# Patient Record
Sex: Male | Born: 1965 | Race: Black or African American | Hispanic: No | Smoking: Former smoker
Health system: Southern US, Community
[De-identification: ages and names within clinical notes are randomized; demographics above are authoritative.]

## PROBLEM LIST (undated history)

## (undated) DIAGNOSIS — I639 Cerebral infarction, unspecified: Secondary | ICD-10-CM

## (undated) DIAGNOSIS — I10 Essential (primary) hypertension: Secondary | ICD-10-CM

## (undated) HISTORY — PX: NO PAST SURGERIES: SHX2092

## (undated) HISTORY — DX: Cerebral infarction, unspecified: I63.9

---

## 2014-02-11 ENCOUNTER — Encounter (HOSPITAL_COMMUNITY): Payer: Self-pay | Admitting: Emergency Medicine

## 2014-02-11 ENCOUNTER — Emergency Department (INDEPENDENT_AMBULATORY_CARE_PROVIDER_SITE_OTHER)
Admission: EM | Admit: 2014-02-11 | Discharge: 2014-02-11 | Disposition: A | Discharge status: Home / Self Care | Payer: Self-pay | Source: Home / Self Care | Attending: Emergency Medicine | Admitting: Emergency Medicine

## 2014-02-11 ENCOUNTER — Emergency Department (INDEPENDENT_AMBULATORY_CARE_PROVIDER_SITE_OTHER): Payer: Self-pay

## 2014-02-11 DIAGNOSIS — T07XXXA Unspecified multiple injuries, initial encounter: Secondary | ICD-10-CM

## 2014-02-11 DIAGNOSIS — Z23 Encounter for immunization: Secondary | ICD-10-CM

## 2014-02-11 DIAGNOSIS — W19XXXA Unspecified fall, initial encounter: Secondary | ICD-10-CM

## 2014-02-11 DIAGNOSIS — IMO0002 Reserved for concepts with insufficient information to code with codable children: Secondary | ICD-10-CM

## 2014-02-11 DIAGNOSIS — S8000XA Contusion of unspecified knee, initial encounter: Secondary | ICD-10-CM

## 2014-02-11 DIAGNOSIS — Y92410 Unspecified street and highway as the place of occurrence of the external cause: Secondary | ICD-10-CM

## 2014-02-11 DIAGNOSIS — S8002XA Contusion of left knee, initial encounter: Secondary | ICD-10-CM

## 2014-02-11 MED ORDER — TETANUS-DIPHTH-ACELL PERTUSSIS 5-2.5-18.5 LF-MCG/0.5 IM SUSP
INTRAMUSCULAR | Status: AC
  Filled 2014-02-11: qty 0.5

## 2014-02-11 MED ORDER — TRAMADOL HCL 50 MG PO TABS: 100.0000 mg | ORAL_TABLET | Freq: Three times a day (TID) | ORAL | Status: DC | PRN

## 2014-02-11 MED ORDER — TETANUS-DIPHTH-ACELL PERTUSSIS 5-2.5-18.5 LF-MCG/0.5 IM SUSP
0.5000 mL | Freq: Once | INTRAMUSCULAR | Status: AC
  Administered 2014-02-11: 0.5 mL via INTRAMUSCULAR

## 2014-02-11 MED ORDER — MUPIROCIN 2 % EX OINT: 1.0000 "application " | TOPICAL_OINTMENT | Freq: Three times a day (TID) | CUTANEOUS | Status: DC

## 2014-02-11 MED ORDER — BACITRACIN ZINC 500 UNIT/GM EX OINT
TOPICAL_OINTMENT | CUTANEOUS | Status: AC
  Filled 2014-02-11: qty 2.7

## 2014-02-11 MED ORDER — HYDROCODONE-ACETAMINOPHEN 5-325 MG PO TABS: ORAL_TABLET | ORAL | Status: DC

## 2014-02-11 NOTE — ED Provider Notes (Signed)
Chief Complaint   Chief Complaint  Patient presents with  . Fall    History of Present Illness   Darryl Sullivan is a 48 year old male who fell this past Monday, 4 days ago, while walking on a sidewalk. The patient states his right leg just gave way on him. He denies loss of consciousness or fainting. He abraded the right side of his face, his right dorsal hand, and his right knee. He's been ambulatory since then. He denies any headache or neurological symptoms. He has no visual symptoms. The abrasions on the face are healing up well. He has 2 small abrasions on the dorsum of his right hand. All of his digits have full range of motion and he denies any pain in the hand. He has a larger abrasion on his right knee overlying the patella. The patella is sore. He is ambulatory but has been walking with a limp. He denies any further giving way of the knee.  Review of Systems   Other than as noted above, the patient denies any of the following symptoms: ENT:  No headache, facial pain, or bleeding from the nose or ears.  No loose or broken teeth. Neck:  No neck pain or stiffnes. Cardiac:  No chest pain. No palpitations, dizziness, syncope or fainting. GI:  No abdominal pain. No nausea or vomiting. M-S:  No extremity pain, swelling, bruising, limited ROM, or back pain. Neuro:  No loss of consciousness, seizure activity, dizziness, vertigo, paresthesias, numbness, or weakness.  No difficulty with speech or ambulation.  PMFSH   Past medical history, family history, social history, meds, and allergies were reviewed.  He is allergic to penicillin and has intolerance to tramadol.  Physical Examination    Vital signs:  BP 189/114  Pulse 84  Temp(Src) 98.7 F (37.1 C) (Oral)  Resp 12  SpO2 97% General:  Alert, oriented and in no distress. Eye:  PERRL, full EOMs. ENT:  No cranial or facial tenderness to palpation. He had some healing abrasions on his forehead and around his right eye and on his  right cheek. There was no tenderness to palpation. Neck:  No tenderness to palpation.  Full ROM without pain. Heart:  Regular rhythm.  No extrasystoles, gallops, or murmers. Lungs:  No chest wall tenderness to palpation. Breath sounds clear and equal bilaterally.  No wheezes, rales or rhonchi. Abdomen:  Non tender. Back:  Non tender to palpation.  Full ROM without pain. Extremities:  There are 2 small abrasions on the dorsum of the right hand. All joints have full range of motion. There was no tenderness to palpation exam of the right knee reveals tenderness to palpation over the patella and medial and lateral joint lines. The knee had a full range of motion with no pain and no crepitus. There is sign was negative, Lachman's sign was negative, anterior drawer sign was negative, valgus and varus stress were normal.  Full ROM of all joints without pain.  Pulses full.  Brisk capillary refill. Neuro:  Alert and oriented times 3.  Cranial nerves intact.  No muscle weakness.  Sensation intact to light touch.  Gait normal. Skin:  No bruising, abrasions, or lacerations.  Radiology   Dg Knee Complete 4 Views Right  02/11/2014   CLINICAL DATA:  Fall 4 days ago with right knee pain  EXAM: RIGHT KNEE - COMPLETE 4+ VIEW  COMPARISON:  None.  FINDINGS: Soft tissue swelling to the anterior knee. There is no evidence of fracture, dislocation, or  joint effusion. Chronic, incidental insertional changes at the tibial tuberosity. No joint narrowing.  IMPRESSION: Anterior soft tissue swelling.  No acute osseous findings.   Electronically Signed   By: Tiburcio Pea M.D.   On: 02/11/2014 16:09    [facility]   Antibiotic ointment was applied to all the abrasions. A sterile dressing was applied to the right knee followed by a knee sleeve.  Assessment   The primary encounter diagnosis was Fall, initial encounter. Diagnoses of Abrasions of multiple sites, Knee contusion, left, initial encounter, and  Place of occurrence, street and highway were also pertinent to this visit.  Plan   1.  Meds:  The following meds were prescribed:   New Prescriptions   HYDROCODONE-ACETAMINOPHEN (NORCO/VICODIN) 5-325 MG PER TABLET    1 to 2 tabs every 4 to 6 hours as needed for pain.   MUPIROCIN OINTMENT (BACTROBAN) 2 %    Apply 1 application topically 3 (three) times daily.    2.  Patient Education/Counseling:  The patient was given appropriate handouts, self care instructions, and instructed in symptomatic relief.    3.  Follow up:  The patient was told to follow up here if no better in 3 to 4 days, or sooner if becoming worse in any way, and given some red flag symptoms such as increasing pain or new neurological symptoms which would prompt immediate return.  Follow up here if necessary.     Reuben Likes, MD 02/11/14 [phone]

## 2014-02-11 NOTE — Discharge Instructions (Signed)
Knee pain can be caused by many conditions:  Osteoarthritis, gout, bursitis, tendonitis, cartiledge damage, condromalacia patella, patellofemoral syndrome, and ligament sprain to name just a few.  Often some simple conservative measures can help alleviate the pain.  Do not do the following:  Avoid squatting and doing deep knee bends.  This puts too much of load on your cartiledges and tendons.  If you do a knee bend, go only half way down, flexing your knee no more than 90 degrees.  Do the following:  If you are overweight or obese, lose weight.  This makes for a lot less load on your knee joints.  If you use tobacco, quit.  Nicotine causes spasm of the small arteries, decreases blood flow, and impairs your body's normal ability to repair damage.  If your knee is acutely inflamed, use the principles of RICE (rest, ice, compression, and elevation).  Wearing a knee brace can help.  These are usually made of neoprene and can be purchased over the counter at the drug store.  Use of over the counter pain meds can be of help.  Tylenol (or acetaminophen) is the safest to use.  It often helps to take this regularly.  You can take up to 2 325 mg tablets 5 times daily, but it best to start out much lower that that, perhaps 2 325 mg tablets twice daily, then increase from there. People who are on the blood thinner warfarin have to be careful about taking high doses of Tylenol.  For people who are able to tolerate them, ibuprofen and naproxyn can also help with the pain.  You should discuss these agents with your physician before taking them.  People with chronic kidney disease, hypertension, peptic ulcer disease, and reflux can suffer adverse side effects. They should not be taken with warfarin. The maximum dosage of ibuprofen is 800 mg 3 times daily with meals.  The maximum dosage of naprosyn is 2 and 1/2 tablets twice daily with food, but again, start out low and gradually increase the dose until adequate  pain relief is achieved. Ibuprofen and naprosyn should always be taken with food.  People with cartiledge injury or osteoarthritis may find glucosamine to be helpful.  This is an over-the-counter supplement that helps nourish and repair cartiledge.  The dose is 500 mg 3 times daily or 1500 mg taken in a single dose. This can take several months to work and it doesn't always work.    For people with knee pain on just one side, use of a cane held in the hand on the same side as the knee pain takes some of the stress off the knee joint and can make a big difference in knee pain.  Wearing good shoes with adequate arch support is essential.  Regular exercise is of utmost importance.  Swimming, water aerobics, or use of an elliptical exerciser put the least stress on the knees of any exercise.  Finally doing the exercises below can be very helpful.  They tend to strengthen the muscles around the knee and provide extra support and stability.  Try to do them twice a day followed by ice for 10 minutes.       Abrasion An abrasion is a cut or scrape of the skin. Abrasions do not extend through all layers of the skin and most heal within 10 days. It is important to care for your abrasion properly to prevent infection. CAUSES  Most abrasions are caused by falling on, or gliding across,  the ground or other surface. When your skin rubs on something, the outer and inner layer of skin rubs off, causing an abrasion. DIAGNOSIS  Your caregiver will be able to diagnose an abrasion during a physical exam.  TREATMENT  Your treatment depends on how large and deep the abrasion is. Generally, your abrasion will be cleaned with water and a mild soap to remove any dirt or debris. An antibiotic ointment may be put over the abrasion to prevent an infection. A bandage (dressing) may be wrapped around the abrasion to keep it from getting dirty.  You may need a tetanus shot if:  You cannot remember when you had your last  tetanus shot.  You have never had a tetanus shot.  The injury broke your skin. If you get a tetanus shot, your arm may swell, get red, and feel warm to the touch. This is common and not a problem. If you need a tetanus shot and you choose not to have one, there is a rare chance of getting tetanus. Sickness from tetanus can be serious.  HOME CARE INSTRUCTIONS   If a dressing was applied, change it at least once a day or as directed by your caregiver. If the bandage sticks, soak it off with warm water.   Wash the area with water and a mild soap to remove all the ointment 2 times a day. Rinse off the soap and pat the area dry with a clean towel.   Reapply any ointment as directed by your caregiver. This will help prevent infection and keep the bandage from sticking. Use gauze over the wound and under the dressing to help keep the bandage from sticking.   Change your dressing right away if it becomes wet or dirty.   Only take over-the-counter or prescription medicines for pain, discomfort, or fever as directed by your caregiver.   Follow up with your caregiver within 24-48 hours for a wound check, or as directed. If you were not given a wound-check appointment, look closely at your abrasion for redness, swelling, or pus. These are signs of infection. SEEK IMMEDIATE MEDICAL CARE IF:   You have increasing pain in the wound.   You have redness, swelling, or tenderness around the wound.   You have pus coming from the wound.   You have a fever or persistent symptoms for more than 2-3 days.  You have a fever and your symptoms suddenly get worse.  You have a bad smell coming from the wound or dressing.  MAKE SURE YOU:   Understand these instructions.  Will watch your condition.  Will get help right away if you are not doing well or get worse. Document Released: 03/07/2005 Document Revised: 05/14/2012 Document Reviewed: 05/01/2011 [facility] Patient Information 2015 [place],  [place]. This information is not intended to replace advice given to you by your health care provider. Make sure you discuss any questions you have with your health care provider.

## 2014-02-11 NOTE — ED Notes (Signed)
Pt reports legs gave out on him causing him to fall.  Abrasion to right side of the face.  Right knee pain and abrasion.      Denies any other injuries.   States incident happened two days ago.

## 2014-02-12 ENCOUNTER — Telehealth (HOSPITAL_COMMUNITY): Payer: Self-pay | Admitting: Emergency Medicine

## 2014-02-12 MED ORDER — HYDROCHLOROTHIAZIDE 12.5 MG PO TABS: 12.5000 mg | ORAL_TABLET | Freq: Every day | ORAL | Status: DC

## 2014-02-12 NOTE — ED Notes (Signed)
Patient's blood pressure was elevated yesterday, and I did not give him anything for it.  I called him back today and he states he's doing better.  No headache, chest pain or shortness of breath.  Will start on HCTZ 12.5 mg daily and have him follow up either here or with a primary care doctor in 1 to 2 weeks.   Reuben Likes, MD 02/12/14 [phone]

## 2014-09-07 ENCOUNTER — Emergency Department (HOSPITAL_COMMUNITY)
Admission: EM | Admit: 2014-09-07 | Discharge: 2014-09-07 | Disposition: A | Discharge status: Home / Self Care | Payer: Self-pay | Attending: Emergency Medicine | Admitting: Emergency Medicine

## 2014-09-07 ENCOUNTER — Emergency Department (HOSPITAL_COMMUNITY): Payer: Self-pay

## 2014-09-07 ENCOUNTER — Encounter (HOSPITAL_COMMUNITY): Payer: Self-pay | Admitting: Emergency Medicine

## 2014-09-07 DIAGNOSIS — Z880 Allergy status to penicillin: Secondary | ICD-10-CM | POA: Insufficient documentation

## 2014-09-07 DIAGNOSIS — M779 Enthesopathy, unspecified: Secondary | ICD-10-CM | POA: Insufficient documentation

## 2014-09-07 DIAGNOSIS — M7750 Other enthesopathy of unspecified foot: Secondary | ICD-10-CM

## 2014-09-07 MED ORDER — HYDROCODONE-ACETAMINOPHEN 5-325 MG PO TABS: 1.0000 | ORAL_TABLET | ORAL | Status: DC | PRN

## 2014-09-07 MED ORDER — DEXAMETHASONE 4 MG PO TABS: 4.0000 mg | ORAL_TABLET | Freq: Two times a day (BID) | ORAL | Status: DC

## 2014-09-07 MED ORDER — DICLOFENAC SODIUM 75 MG PO TBEC: 75.0000 mg | DELAYED_RELEASE_TABLET | Freq: Two times a day (BID) | ORAL | Status: DC

## 2014-09-07 NOTE — ED Provider Notes (Signed)
CSN: [id number]     Arrival date & time 09/07/14  1009 History   First MD Initiated Contact with Patient 09/07/14 1031     Chief Complaint  Patient presents with  . Ankle Pain     (Consider location/radiation/quality/duration/timing/severity/associated sxs/prior Treatment) Patient is a 49 y.o. male presenting with ankle pain. The history is provided by the patient.  Ankle Pain Location:  Ankle Ankle location:  R ankle Pain details:    Quality:  Shooting and sharp   Radiates to: radiates to toes.   Severity:  Moderate   Onset quality:  Gradual   Duration:  2 weeks   Timing:  Intermittent   Progression:  Worsening Chronicity:  New Prior injury to area:  No Relieved by:  Nothing Worsened by:  Bearing weight Ineffective treatments:  None tried Associated symptoms: swelling   Associated symptoms: no numbness and no tingling   Risk factors: no frequent fractures and no recent illness     History reviewed. No pertinent past medical history. History reviewed. No pertinent past surgical history. History reviewed. No pertinent family history. History  Substance Use Topics  . Smoking status: Never Smoker   . Smokeless tobacco: Never Used  . Alcohol Use: No    Review of Systems  Musculoskeletal: Positive for arthralgias.  All other systems reviewed and are negative.     Allergies  Penicillins and Tramadol  Home Medications   Prior to Admission medications   Medication Sig Start Date End Date Taking? Authorizing Provider  hydrochlorothiazide (HYDRODIURIL) 12.5 MG tablet Take 1 tablet (12.5 mg total) by mouth daily. Patient not taking: Reported on 09/07/2014 02/12/14   Reuben Likesavid C Keller, MD  HYDROcodone-acetaminophen (NORCO/VICODIN) 5-325 MG per tablet 1 to 2 tabs every 4 to 6 hours as needed for pain. Patient not taking: Reported on 09/07/2014 02/11/14   Reuben Likesavid C Keller, MD  mupirocin ointment (BACTROBAN) 2 % Apply 1 application topically 3 (three) times daily. Patient not  taking: Reported on 09/07/2014 02/11/14   Reuben Likesavid C Keller, MD   BP 161/104 mmHg  Pulse 89  Temp(Src) 98.5 F (36.9 C) (Oral)  Resp 18  Ht 5\' 7"  (1.702 m)  Wt 220 lb (99.791 kg)  BMI 34.45 kg/m2  SpO2 98% Physical Exam  Constitutional: He is oriented to person, place, and time. He appears well-developed and well-nourished.  Non-toxic appearance.  HENT:  Head: Normocephalic.  Right Ear: Tympanic membrane and external ear normal.  Left Ear: Tympanic membrane and external ear normal.  Eyes: EOM and lids are normal. Pupils are equal, round, and reactive to light.  Neck: Normal range of motion. Neck supple. Carotid bruit is not present.  Cardiovascular: Normal rate, regular rhythm, normal heart sounds, intact distal pulses and normal pulses.   Pulmonary/Chest: Breath sounds normal. No respiratory distress.  Abdominal: Soft. Bowel sounds are normal. There is no tenderness. There is no guarding.  Musculoskeletal:       Right ankle: He exhibits decreased range of motion. He exhibits no swelling, no deformity and normal pulse. Tenderness. Medial malleolus tenderness found.       Right foot: There is decreased range of motion and tenderness. There is no swelling and normal capillary refill.       Feet:  Lymphadenopathy:       Head (right side): No submandibular adenopathy present.       Head (left side): No submandibular adenopathy present.    He has no cervical adenopathy.  Neurological: He is alert and oriented to  person, place, and time. He has normal strength. No cranial nerve deficit or sensory deficit.  Skin: Skin is warm and dry.  Psychiatric: He has a normal mood and affect. His speech is normal.  Nursing note and vitals reviewed.   ED Course  Procedures (including critical care time) Labs Review Labs Reviewed - No data to display  Imaging Review No results found.   EKG Interpretation None      MDM  Blood pressure is elevated at 161/104, otherwise the vital signs are  within normal limits. The pulse oximetry is 98% on room air. Within normal limits by my interpretation. X-ray of the right ankle is negative for fracture, dislocation, gas, or acute abnormality.  Suspect the patient has a tendinitis involving the right foot and ankle. The patient will be treated with anti-inflammatories, steroid, and pain medication. Patient is to follow with primary care physician.    Final diagnoses:  None    *I have reviewed nursing notes, vital signs, and all appropriate lab and imaging results for this patient.[address], PA-C 09/07/14 1238  Glynn Octave, MD 09/07/14 [phone]

## 2014-09-07 NOTE — Discharge Instructions (Signed)
The x-ray of your foot and ankle is negative for fracture, dislocation, or sign of gas related infection. Please use the ankle stirrup splint for the next 7-10 days. Please use medications as suggested. Norco may cause drowsiness, please do not drive, drink alcohol, operating machinery, or participate in activities requiring concentration when taking this medication. Tendinitis Tendinitis is swelling and inflammation of the tendons. Tendons are band-like tissues that connect muscle to bone. Tendinitis commonly occurs in the:   Shoulders (rotator cuff).  Heels (Achilles tendon).  Elbows (triceps tendon). CAUSES Tendinitis is usually caused by overusing the tendon, muscles, and joints involved. When the tissue surrounding a tendon (synovium) becomes inflamed, it is called tenosynovitis. Tendinitis commonly develops in people whose jobs require repetitive motions. SYMPTOMS  Pain.  Tenderness.  Mild swelling. DIAGNOSIS Tendinitis is usually diagnosed by physical exam. Your health care provider may also order X-rays or other imaging tests. TREATMENT Your health care provider may recommend certain medicines or exercises for your treatment. HOME CARE INSTRUCTIONS   Use a sling or splint for as long as directed by your health care provider until the pain decreases.  Put ice on the injured area.  Put ice in a plastic bag.  Place a towel between your skin and the bag.  Leave the ice on for 15-20 minutes, 3-4 times a day, or as directed by your health care provider.  Avoid using the limb while the tendon is painful. Perform gentle range of motion exercises only as directed by your health care provider. Stop exercises if pain or discomfort increase, unless directed otherwise by your health care provider.  Only take over-the-counter or prescription medicines for pain, discomfort, or fever as directed by your health care provider. SEEK MEDICAL CARE IF:   Your pain and swelling  increase.  You develop new, unexplained symptoms, especially increased numbness in the hands. MAKE SURE YOU:   Understand these instructions.  Will watch your condition.  Will get help right away if you are not doing well or get worse. Document Released: 05/25/2000 Document Revised: 10/12/2013 Document Reviewed: 08/14/2010 [facility] Patient Information 2015 [place], [place]. This information is not intended to replace advice given to you by your health care provider. Make sure you discuss any questions you have with your health care provider.

## 2014-09-07 NOTE — ED Notes (Signed)
Patient c/o right inner ankle pain that radiates to top of foot and ankle x2 weeks. Denies any known injury. Per patient has had "to walk on side of foot due to pain." Denies taking any medication for pain. CNS intact.

## 2016-03-22 ENCOUNTER — Encounter (HOSPITAL_COMMUNITY): Payer: Self-pay | Admitting: Emergency Medicine

## 2016-03-22 ENCOUNTER — Emergency Department (HOSPITAL_COMMUNITY)
Admission: EM | Admit: 2016-03-22 | Discharge: 2016-03-23 | Disposition: A | Discharge status: Home / Self Care | Payer: Self-pay | Attending: Emergency Medicine | Admitting: Emergency Medicine

## 2016-03-22 ENCOUNTER — Emergency Department (HOSPITAL_COMMUNITY): Payer: Self-pay

## 2016-03-22 DIAGNOSIS — W230XXA Caught, crushed, jammed, or pinched between moving objects, initial encounter: Secondary | ICD-10-CM | POA: Insufficient documentation

## 2016-03-22 DIAGNOSIS — F17210 Nicotine dependence, cigarettes, uncomplicated: Secondary | ICD-10-CM | POA: Insufficient documentation

## 2016-03-22 DIAGNOSIS — Y999 Unspecified external cause status: Secondary | ICD-10-CM | POA: Insufficient documentation

## 2016-03-22 DIAGNOSIS — T148XXA Other injury of unspecified body region, initial encounter: Secondary | ICD-10-CM

## 2016-03-22 DIAGNOSIS — Y9301 Activity, walking, marching and hiking: Secondary | ICD-10-CM | POA: Insufficient documentation

## 2016-03-22 DIAGNOSIS — S9781XA Crushing injury of right foot, initial encounter: Secondary | ICD-10-CM | POA: Insufficient documentation

## 2016-03-22 DIAGNOSIS — Y929 Unspecified place or not applicable: Secondary | ICD-10-CM | POA: Insufficient documentation

## 2016-03-22 MED ORDER — NAPROXEN 250 MG PO TABS
500.0000 mg | ORAL_TABLET | Freq: Once | ORAL | Status: AC
  Administered 2016-03-22: 500 mg via ORAL
  Filled 2016-03-22: qty 2

## 2016-03-22 MED ORDER — NAPROXEN 500 MG PO TABS: 500.0000 mg | ORAL_TABLET | Freq: Two times a day (BID) | ORAL | 0 refills | Status: DC

## 2016-03-22 MED ORDER — HYDROCODONE-ACETAMINOPHEN 5-325 MG PO TABS: 1.0000 | ORAL_TABLET | Freq: Four times a day (QID) | ORAL | 0 refills | Status: DC | PRN

## 2016-03-22 NOTE — Discharge Instructions (Signed)
Take the meds as prescribed. See the orthopedics if you are not improving.
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
## 2019-01-29 ENCOUNTER — Ambulatory Visit
Admission: EM | Admit: 2019-01-29 | Discharge: 2019-01-29 | Disposition: A | Discharge status: Home / Self Care | Payer: Self-pay | Attending: Urgent Care | Admitting: Urgent Care

## 2019-01-29 ENCOUNTER — Other Ambulatory Visit: Payer: Self-pay

## 2019-01-29 DIAGNOSIS — L0390 Cellulitis, unspecified: Secondary | ICD-10-CM

## 2019-01-29 DIAGNOSIS — L253 Unspecified contact dermatitis due to other chemical products: Secondary | ICD-10-CM

## 2019-01-29 MED ORDER — PREDNISONE 10 MG (21) PO TBPK: ORAL_TABLET | Freq: Every day | ORAL | 0 refills | Status: DC

## 2019-01-29 MED ORDER — SULFAMETHOXAZOLE-TRIMETHOPRIM 800-160 MG PO TABS: 1.0000 | ORAL_TABLET | Freq: Two times a day (BID) | ORAL | 0 refills | Status: AC

## 2019-01-29 NOTE — ED Provider Notes (Signed)
[place], [place]   Name: Darryl Sullivan DOB: 10/20/1965 MRN: [id number] CSN: [id number] PCP: Patient, No Pcp Per  Arrival date and time:  01/29/19 [id number]  Chief Complaint:  Rash   NOTE: Prior to seeing the patient today, I have reviewed the triage nursing documentation and vital signs. Clinical staff has updated patient's PMH/PSHx, current medication list, and drug allergies/intolerances to ensure comprehensive history available to assist in medical decision making.   History:   HPI: Darryl Sullivan is a 53 y.o. male who presents today with complaints of rash following occupational chemical exposure. Patient reports that areas of concern have developed over the course of the last month as he has performed his normal job duties today at [employer] [place]. He does not wish to file worker's compensation for this visit. Patient notes that his exposure has been to a chemical called methyl ethyl ketone (MEK). Patient wearing PPE as supplied by his employer. He notes that the chemical has been getting inside of his gloves and causing irritation to his skin. Rash to hands extending up to wrists BILATERALLY (R>L). Patient with open areas to RIGHT wrist with noted erythema and purulent drainage. Patient advises that his supervisor is aware of his skin condition. Despite his symptoms, patient has not taken any over the counter interventions to help improve/relieve his reported symptoms at home.   History reviewed. No pertinent past medical history.  Past Surgical History:  Procedure Laterality Date  . NO PAST SURGERIES      Family History  Problem Relation Age of Onset  . Diabetes Mother     Social History   Tobacco Use  . Smoking status: Current Every Day Smoker    Packs/day: 0.25    Types: Cigarettes  . Smokeless tobacco: Current User    Types: Chew  Substance Use Topics  . Alcohol use: No  . Drug use: No    There are no active problems to display for this patient.   Home Medications:     No outpatient medications have been marked as taking for the 01/29/19 encounter [facility](Hospital Encounter).    Allergies:   Penicillins and Tramadol  Review of Systems (ROS): Review of Systems  Constitutional: Negative for chills and fever.  HENT: Negative for drooling and trouble swallowing.   Respiratory: Negative for cough and shortness of breath.   Cardiovascular: Negative for chest pain and palpitations.  Skin: Positive for color change and rash.  All other systems reviewed and are negative.    Vital Signs: Today's Vitals   01/29/19 1007 01/29/19 1009 01/29/19 1040 01/29/19 1046  BP:  (!) 165/105 (!) 160/90   Pulse:  77    Resp:  18    Temp:  98.2 F (36.8 C)    TempSrc:  Oral    SpO2:  99%    Weight: 220 lb (99.8 kg)     Height: 5\' 11"  (1.803 m)     PainSc: 5    5     Physical Exam: Physical Exam  Constitutional: He is oriented to person, place, and time and well-developed, well-nourished, and in no distress.  HENT:  Head: Normocephalic and atraumatic.  Mouth/Throat: Mucous membranes are normal.  Cardiovascular: Normal rate, regular rhythm, normal heart sounds and intact distal pulses. Exam reveals no gallop and no friction rub.  No murmur heard. Pulmonary/Chest: Effort normal and breath sounds normal. No respiratory distress. He has no wheezes. He has no rales.  Neurological: He is alert and oriented to person,  place, and time. Gait normal. GCS score is 15.  Skin: Skin is warm and dry.  Skin irritation to BILATERAL wrists and hands secondary to chemical exposure. Irritation terminates just above the wrist. RIGHT wrist with open areas and purulent drainage. (+) TTP and warm (as compared contralaterally). See attached medical photographs.   Psychiatric: Mood, memory, affect and judgment normal.  Nursing note and vitals reviewed.    Urgent Care Treatments / Results:   LABS: PLEASE NOTE: all labs that were ordered this encounter are listed, however only abnormal  results are displayed. Labs Reviewed - No data to display  EKG: -None  RADIOLOGY: No results found.  PROCEDURES: Procedures  MEDICATIONS RECEIVED THIS VISIT: Medications - No data to display  PERTINENT CLINICAL COURSE NOTES/UPDATES:   Initial Impression / Assessment and Plan / Urgent Care Course:  Pertinent labs & imaging results that were available during my care of the patient were personally reviewed by me and considered in my medical decision making (see lab/imaging section of note for values and interpretations).  Darryl Sullivan is a 53 y.o. male who presents to [facility] today with complaints of Rash   Patient is well appearing overall in clinic today. He does not appear to be in any acute distress. Presenting symptoms (see HPI) and exam as documented above. Exam consistent with chemical induced dermatitis related to occupation [place] exposure. Patient with developing cellulitis to RIGHT wrist; areas open and draining purulent material. He denies fevers. Discussed need to wear better PPE while at work. Documentation included today asking safety officer to evaluate patient for gloves that extend further up his arms to prevent further exposure/constact to the chemical. Will treat current symptoms with a 5 day course of SMZ-TMP, in addition to a steroid taper. Patient advised to keep hands clean and dry. He was asked not to wear gloves for the next few days.   Current clinical condition warrants patient being out of work in order to recover from his current injury/illness. He was provided with the appropriate documentation to provide to his place of employment that will allow for him to RTW on 02/02/2019 with no restrictions.   Discussed follow up with primary care physician in 1 week for re-evaluation. I have reviewed the follow up and strict return precautions for any new or worsening symptoms. Patient is aware of symptoms that would be deemed urgent/emergent, and would thus  require further evaluation either here or in the emergency department. At the time of discharge, he verbalized understanding and consent with the discharge plan as it was reviewed with him. All questions were fielded by provider and/or clinic staff prior to patient discharge.    Final Clinical Impressions / Urgent Care Diagnoses:   Final diagnoses:  Chemical dermatitis  Cellulitis, unspecified cellulitis site    New Prescriptions:  [place] Controlled Substance Registry consulted? Not Applicable  Meds ordered this encounter  Medications  . sulfamethoxazole-trimethoprim (BACTRIM DS) 800-160 MG tablet    Sig: Take 1 tablet by mouth 2 (two) times daily for 5 days.    Dispense:  10 tablet    Refill:  0  . predniSONE (STERAPRED UNI-PAK 21 TAB) 10 MG (21) TBPK tablet    Sig: Take by mouth daily. 60 mg x 1 day, 50 mg x 1 day, 40 mg x 1 day, 30 mg x 1 day, 20 mg x 1 day, 10 mg x 1 day    Dispense:  21 tablet    Refill:  0  Recommended Follow up Care:  Patient encouraged to follow up with the following provider within the specified time frame, or sooner as dictated by the severity of his symptoms. As always, he was instructed that for any urgent/emergent care needs, he should seek care either here or in the emergency department for more immediate evaluation.  NOTE: This note was prepared using Scientist, clinical (histocompatibility and immunogenetics)Dragon dictation software along with smaller Lobbyistphrase technology. Despite my best ability to proofread, there is the potential that transcriptional errors may still occur from this process, and are completely unintentional.    Verlee MonteGray, Recia Sons E, NP 01/29/19 1414

## 2019-01-29 NOTE — Discharge Instructions (Addendum)
It was very nice seeing you today in clinic. Thank you for entrusting me with your care.   Your hands are due to the exposure to the [place]. You have a developing infection on your wrist.   Keep hands clean and dry. Please utilize the medications that we discussed. Your prescriptions have been called in to your pharmacy.  Need to discuss better gloves with your employer. Gloves need to extend at least to mid-forearm area.   Make arrangements to follow up with your regular doctor in 1 week for re-evaluation if not improving. If your symptoms/condition worsens, please seek follow up care either here or in the ER. Please remember, our [place] providers are "right here with you" when you need [place].   Again, it was my pleasure to take care of you today. Thank you for choosing our clinic. I hope that you start to feel better quickly.   Honor Loh, MSN, APRN, FNP-C, CEN Advanced Practice Provider [facility]

## 2019-01-29 NOTE — ED Notes (Signed)
Non stick dressing and Cobam applied to right wrist area.

## 2019-01-29 NOTE — ED Triage Notes (Addendum)
Patient complains of rash on hands that started while he was work months ago. Patient has scaly like areas all over hands. 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

## 2020-09-05 ENCOUNTER — Other Ambulatory Visit: Payer: Self-pay

## 2020-09-05 ENCOUNTER — Inpatient Hospital Stay: Payer: BC Managed Care – PPO

## 2020-09-05 ENCOUNTER — Emergency Department: Payer: BC Managed Care – PPO

## 2020-09-05 ENCOUNTER — Inpatient Hospital Stay (HOSPITAL_COMMUNITY)
Admit: 2020-09-05 | Discharge: 2020-09-05 | Disposition: A | Discharge status: Home / Self Care | Payer: BC Managed Care – PPO | Attending: Pulmonary Disease | Admitting: Pulmonary Disease

## 2020-09-05 ENCOUNTER — Inpatient Hospital Stay
Admission: EM | Admit: 2020-09-05 | Discharge: 2020-09-14 | DRG: 061 | Disposition: A | Discharge status: Rehabilitation Facility | Payer: BC Managed Care – PPO | Attending: Internal Medicine | Admitting: Internal Medicine

## 2020-09-05 DIAGNOSIS — Z79899 Other long term (current) drug therapy: Secondary | ICD-10-CM

## 2020-09-05 DIAGNOSIS — R29703 NIHSS score 3: Secondary | ICD-10-CM | POA: Diagnosis present

## 2020-09-05 DIAGNOSIS — R000 Tachycardia, unspecified: Secondary | ICD-10-CM | POA: Diagnosis not present

## 2020-09-05 DIAGNOSIS — I10 Essential (primary) hypertension: Secondary | ICD-10-CM

## 2020-09-05 DIAGNOSIS — R4701 Aphasia: Secondary | ICD-10-CM | POA: Diagnosis present

## 2020-09-05 DIAGNOSIS — F17210 Nicotine dependence, cigarettes, uncomplicated: Secondary | ICD-10-CM | POA: Diagnosis present

## 2020-09-05 DIAGNOSIS — I129 Hypertensive chronic kidney disease with stage 1 through stage 4 chronic kidney disease, or unspecified chronic kidney disease: Secondary | ICD-10-CM | POA: Diagnosis present

## 2020-09-05 DIAGNOSIS — M7542 Impingement syndrome of left shoulder: Secondary | ICD-10-CM | POA: Diagnosis not present

## 2020-09-05 DIAGNOSIS — G8929 Other chronic pain: Secondary | ICD-10-CM

## 2020-09-05 DIAGNOSIS — Z833 Family history of diabetes mellitus: Secondary | ICD-10-CM

## 2020-09-05 DIAGNOSIS — N179 Acute kidney failure, unspecified: Secondary | ICD-10-CM | POA: Diagnosis present

## 2020-09-05 DIAGNOSIS — I161 Hypertensive emergency: Secondary | ICD-10-CM | POA: Diagnosis present

## 2020-09-05 DIAGNOSIS — Z6837 Body mass index (BMI) 37.0-37.9, adult: Secondary | ICD-10-CM

## 2020-09-05 DIAGNOSIS — N1831 Chronic kidney disease, stage 3a: Secondary | ICD-10-CM | POA: Diagnosis present

## 2020-09-05 DIAGNOSIS — M21332 Wrist drop, left wrist: Secondary | ICD-10-CM | POA: Diagnosis present

## 2020-09-05 DIAGNOSIS — I6381 Other cerebral infarction due to occlusion or stenosis of small artery: Secondary | ICD-10-CM | POA: Diagnosis present

## 2020-09-05 DIAGNOSIS — N183 Chronic kidney disease, stage 3 unspecified: Secondary | ICD-10-CM | POA: Diagnosis not present

## 2020-09-05 DIAGNOSIS — Z880 Allergy status to penicillin: Secondary | ICD-10-CM | POA: Diagnosis not present

## 2020-09-05 DIAGNOSIS — G8194 Hemiplegia, unspecified affecting left nondominant side: Secondary | ICD-10-CM | POA: Diagnosis present

## 2020-09-05 DIAGNOSIS — I6310 Cerebral infarction due to embolism of unspecified precerebral artery: Secondary | ICD-10-CM | POA: Diagnosis not present

## 2020-09-05 DIAGNOSIS — I639 Cerebral infarction, unspecified: Principal | ICD-10-CM

## 2020-09-05 DIAGNOSIS — Z885 Allergy status to narcotic agent status: Secondary | ICD-10-CM | POA: Diagnosis not present

## 2020-09-05 DIAGNOSIS — Z8249 Family history of ischemic heart disease and other diseases of the circulatory system: Secondary | ICD-10-CM | POA: Diagnosis not present

## 2020-09-05 DIAGNOSIS — I6389 Other cerebral infarction: Secondary | ICD-10-CM | POA: Diagnosis not present

## 2020-09-05 DIAGNOSIS — Z720 Tobacco use: Secondary | ICD-10-CM

## 2020-09-05 DIAGNOSIS — M21372 Foot drop, left foot: Secondary | ICD-10-CM | POA: Diagnosis present

## 2020-09-05 DIAGNOSIS — Z20822 Contact with and (suspected) exposure to covid-19: Secondary | ICD-10-CM | POA: Diagnosis present

## 2020-09-05 DIAGNOSIS — R7301 Impaired fasting glucose: Secondary | ICD-10-CM | POA: Diagnosis present

## 2020-09-05 DIAGNOSIS — E785 Hyperlipidemia, unspecified: Secondary | ICD-10-CM | POA: Diagnosis present

## 2020-09-05 DIAGNOSIS — M25512 Pain in left shoulder: Secondary | ICD-10-CM

## 2020-09-05 DIAGNOSIS — I610 Nontraumatic intracerebral hemorrhage in hemisphere, subcortical: Secondary | ICD-10-CM | POA: Diagnosis not present

## 2020-09-05 DIAGNOSIS — T45615A Adverse effect of thrombolytic drugs, initial encounter: Secondary | ICD-10-CM | POA: Diagnosis not present

## 2020-09-05 DIAGNOSIS — I612 Nontraumatic intracerebral hemorrhage in hemisphere, unspecified: Secondary | ICD-10-CM | POA: Diagnosis not present

## 2020-09-05 DIAGNOSIS — I63312 Cerebral infarction due to thrombosis of left middle cerebral artery: Secondary | ICD-10-CM | POA: Diagnosis not present

## 2020-09-05 LAB — COMPREHENSIVE METABOLIC PANEL
ALT: 32 U/L (ref 0–44)
AST: 23 U/L (ref 15–41)
Albumin: 4.4 g/dL (ref 3.5–5.0)
Alkaline Phosphatase: 58 U/L (ref 38–126)
Anion gap: 7 (ref 5–15)
BUN: 21 mg/dL — ABNORMAL HIGH (ref 6–20)
CO2: 23 mmol/L (ref 22–32)
Calcium: 9.4 mg/dL (ref 8.9–10.3)
Chloride: 107 mmol/L (ref 98–111)
Creatinine, Ser: 1.31 mg/dL — ABNORMAL HIGH (ref 0.61–1.24)
GFR, Estimated: >60 mL/min (ref >60)
Glucose, Bld: 115 mg/dL — ABNORMAL HIGH (ref 70–99)
Potassium: 4.2 mmol/L (ref 3.5–5.1)
Sodium: 137 mmol/L (ref 135–145)
Total Bilirubin: 0.9 mg/dL (ref 0.3–1.2)
Total Protein: 7.5 g/dL (ref 6.5–8.1)

## 2020-09-05 LAB — DIFFERENTIAL
Abs Immature Granulocytes: 0.02 10*3/uL (ref 0.00–0.07)
Basophils Absolute: 0.1 10*3/uL (ref 0.0–0.1)
Basophils Relative: 1 %
Eosinophils Absolute: 0.1 10*3/uL (ref 0.0–0.5)
Eosinophils Relative: 1 %
Immature Granulocytes: 0 %
Lymphocytes Relative: 23 %
Lymphs Abs: 1.7 10*3/uL (ref 0.7–4.0)
Monocytes Absolute: 0.2 10*3/uL (ref 0.1–1.0)
Monocytes Relative: 3 %
Neutro Abs: 5.2 10*3/uL (ref 1.7–7.7)
Neutrophils Relative %: 72 %

## 2020-09-05 LAB — CBC
HCT: 43.2 % (ref 39.0–52.0)
Hemoglobin: 13.7 g/dL (ref 13.0–17.0)
MCH: 28.4 pg (ref 26.0–34.0)
MCHC: 31.7 g/dL (ref 30.0–36.0)
MCV: 89.4 fL (ref 80.0–100.0)
Platelets: 194 10*3/uL (ref 150–400)
RBC: 4.83 MIL/uL (ref 4.22–5.81)
RDW: 11.9 % (ref 11.5–15.5)
WBC: 7.2 10*3/uL (ref 4.0–10.5)
nRBC: 0 % (ref 0.0–0.2)

## 2020-09-05 LAB — URINALYSIS, ROUTINE W REFLEX MICROSCOPIC
Bacteria, UA: NONE SEEN
Bilirubin Urine: NEGATIVE
Glucose, UA: NEGATIVE mg/dL
Ketones, ur: NEGATIVE mg/dL
Leukocytes,Ua: NEGATIVE
Nitrite: NEGATIVE
Protein, ur: 30 mg/dL — AB
Specific Gravity, Urine: 1.02 (ref 1.005–1.030)
pH: 7 (ref 5.0–8.0)

## 2020-09-05 LAB — APTT: aPTT: 32 seconds (ref 24–36)

## 2020-09-05 LAB — RESP PANEL BY RT-PCR (FLU A&B, COVID) ARPGX2
Influenza A by PCR: NEGATIVE
Influenza B by PCR: NEGATIVE
SARS Coronavirus 2 by RT PCR: NEGATIVE

## 2020-09-05 LAB — URINE DRUG SCREEN, QUALITATIVE (ARMC ONLY)
Amphetamines, Ur Screen: NOT DETECTED
Barbiturates, Ur Screen: NOT DETECTED
Benzodiazepine, Ur Scrn: NOT DETECTED
Cannabinoid 50 Ng, Ur ~~LOC~~: NOT DETECTED
Cocaine Metabolite,Ur ~~LOC~~: NOT DETECTED
MDMA (Ecstasy)Ur Screen: NOT DETECTED
Methadone Scn, Ur: NOT DETECTED
Opiate, Ur Screen: NOT DETECTED
Phencyclidine (PCP) Ur S: NOT DETECTED
Tricyclic, Ur Screen: NOT DETECTED

## 2020-09-05 LAB — MRSA PCR SCREENING: MRSA by PCR: NEGATIVE

## 2020-09-05 LAB — PROTIME-INR
INR: 1.1 (ref 0.8–1.2)
Prothrombin Time: 14 seconds (ref 11.4–15.2)

## 2020-09-05 LAB — I-STAT CREATININE, ED: Creatinine, Ser: 1.3 mg/dL — ABNORMAL HIGH (ref 0.61–1.24)

## 2020-09-05 LAB — ETHANOL: Alcohol, Ethyl (B): <10 mg/dL (ref <10)

## 2020-09-05 MED ORDER — CLEVIDIPINE BUTYRATE 0.5 MG/ML IV EMUL
INTRAVENOUS | Status: AC
  Filled 2020-09-05: qty 50

## 2020-09-05 MED ORDER — IOHEXOL 350 MG/ML SOLN
75.0000 mL | Freq: Once | INTRAVENOUS | Status: AC | PRN
  Administered 2020-09-05: 75 mL via INTRAVENOUS

## 2020-09-05 MED ORDER — SODIUM CHLORIDE 0.9 % IV SOLN
50.0000 mL | Freq: Once | INTRAVENOUS | Status: AC
  Administered 2020-09-05: 50 mL via INTRAVENOUS

## 2020-09-05 MED ORDER — STROKE: EARLY STAGES OF RECOVERY BOOK: Freq: Once | Status: AC

## 2020-09-05 MED ORDER — CLEVIDIPINE BUTYRATE 0.5 MG/ML IV EMUL
0.0000 mg/h | INTRAVENOUS | Status: DC
  Administered 2020-09-05: 1 mg/h via INTRAVENOUS
  Administered 2020-09-05: 6 mg/h via INTRAVENOUS
  Administered 2020-09-06: 8 mg/h via INTRAVENOUS
  Administered 2020-09-06: 1 mg/h via INTRAVENOUS
  Administered 2020-09-06: 2 mg/h via INTRAVENOUS
  Filled 2020-09-05 (×6): qty 50

## 2020-09-05 MED ORDER — PANTOPRAZOLE SODIUM 40 MG IV SOLR
40.0000 mg | Freq: Every day | INTRAVENOUS | Status: DC
  Administered 2020-09-05 – 2020-09-07 (×3): 40 mg via INTRAVENOUS
  Filled 2020-09-05 (×3): qty 40

## 2020-09-05 MED ORDER — ACETAMINOPHEN 650 MG RE SUPP: 650.0000 mg | RECTAL | Status: DC | PRN

## 2020-09-05 MED ORDER — LABETALOL HCL 5 MG/ML IV SOLN
10.0000 mg | INTRAVENOUS | Status: DC | PRN
  Administered 2020-09-06 – 2020-09-09 (×4): 10 mg via INTRAVENOUS
  Filled 2020-09-05 (×4): qty 4

## 2020-09-05 MED ORDER — SODIUM CHLORIDE 0.9 % IV SOLN: INTRAVENOUS | Status: DC

## 2020-09-05 MED ORDER — LABETALOL HCL 5 MG/ML IV SOLN
10.0000 mg | Freq: Once | INTRAVENOUS | Status: AC
  Administered 2020-09-05: 10 mg via INTRAVENOUS
  Filled 2020-09-05: qty 4

## 2020-09-05 MED ORDER — ALTEPLASE (STROKE) FULL DOSE INFUSION
90.0000 mg | Freq: Once | INTRAVENOUS | Status: AC
  Administered 2020-09-05: 90 mg via INTRAVENOUS
  Filled 2020-09-05: qty 100

## 2020-09-05 MED ORDER — NICARDIPINE HCL IN NACL 20-0.86 MG/200ML-% IV SOLN: 3.0000 mg/h | INTRAVENOUS | Status: DC

## 2020-09-05 MED ORDER — ACETAMINOPHEN 160 MG/5ML PO SOLN
650.0000 mg | ORAL | Status: DC | PRN
  Filled 2020-09-05: qty 20.3

## 2020-09-05 MED ORDER — SENNOSIDES-DOCUSATE SODIUM 8.6-50 MG PO TABS: 1.0000 | ORAL_TABLET | Freq: Every evening | ORAL | Status: DC | PRN

## 2020-09-05 MED ORDER — CHLORHEXIDINE GLUCONATE CLOTH 2 % EX PADS
6.0000 | MEDICATED_PAD | Freq: Every day | CUTANEOUS | Status: DC
  Administered 2020-09-05 – 2020-09-11 (×6): 6 via TOPICAL

## 2020-09-05 MED ORDER — ACETAMINOPHEN 325 MG PO TABS: 650.0000 mg | ORAL_TABLET | ORAL | Status: DC | PRN

## 2020-09-05 MED ORDER — ALTEPLASE (STROKE) FULL DOSE INFUSION: 90.0000 mg | Freq: Once | INTRAVENOUS | Status: DC

## 2020-09-05 MED ORDER — ORAL CARE MOUTH RINSE
15.0000 mL | Freq: Two times a day (BID) | OROMUCOSAL | Status: DC
  Administered 2020-09-05 – 2020-09-14 (×17): 15 mL via OROMUCOSAL

## 2020-09-05 NOTE — ED Provider Notes (Signed)
[facility] Emergency Department Provider Note  ____________________________________________   Event Date/Time   First MD Initiated Contact with Patient 09/05/20 [phone]     (approximate)  I have reviewed the triage vital signs and the nursing notes.   HISTORY  Chief Complaint Hypertension   HPI Darryl Sullivan is a 55 y.o. male with past medical history of tobacco abuse who presents via EMS from his place of work where he developed acute onset of left arm and leg weakness around 7 AM this morning.  Patient states he felt totally normal minutes before this.  No recent trauma injuries or other preceding symptoms.  Patient denies any headache, earache, sore throat, chest pain, cough, shortness of breath, neck pain, back pain, dental pain, urinary symptoms, rash or numbness.  No prior similar episodes.  He is not on any blood thinners.  He denies EtOH or illicit drug use.  No history of stroke or high blood pressure.  No medications administered prior to arrival.         No past medical history on file.  Patient Active Problem List   Diagnosis Date Noted  . Ischemic stroke (HCC) 09/05/2020    Past Surgical History:  Procedure Laterality Date  . NO PAST SURGERIES      Prior to Admission medications   Medication Sig Start Date End Date Taking? Authorizing Provider  hydrochlorothiazide (HYDRODIURIL) 12.5 MG tablet Take 1 tablet (12.5 mg total) by mouth daily. Patient not taking: Reported on 09/07/2014 02/12/14 01/29/19  Reuben Likes, MD    Allergies Penicillins and Tramadol  Family History  Problem Relation Age of Onset  . Diabetes Mother     Social History Social History   Tobacco Use  . Smoking status: Current Every Day Smoker    Packs/day: 0.25    Types: Cigarettes  . Smokeless tobacco: Current User    Types: Chew  Vaping Use  . Vaping Use: Never used  Substance Use Topics  . Alcohol use: No  . Drug use: No    Review of  Systems  Review of Systems  Constitutional: Negative for chills and fever.  HENT: Negative for sore throat.   Eyes: Negative for pain.  Respiratory: Negative for cough and stridor.   Cardiovascular: Negative for chest pain.  Gastrointestinal: Negative for vomiting.  Genitourinary: Negative for dysuria.  Musculoskeletal: Negative for myalgias.  Skin: Negative for rash.  Neurological: Positive for weakness ( L arm, L leg). Negative for seizures, loss of consciousness and headaches.  Psychiatric/Behavioral: Negative for suicidal ideas.  All other systems reviewed and are negative.     ____________________________________________   PHYSICAL EXAM:  VITAL SIGNS: ED Triage Vitals  Enc Vitals Group     BP 09/05/20 0936 (!) 231/126     Pulse Rate 09/05/20 0936 99     Resp 09/05/20 0936 16     Temp 09/05/20 0936 98.9 F (37.2 C)     Temp Source 09/05/20 0936 Oral     SpO2 09/05/20 0934 98 %     Weight 09/05/20 0935 227 lb (103 kg)     Height 09/05/20 0935 5\' 7"  (1.702 m)     Head Circumference --      Peak Flow --      Pain Score 09/05/20 0935 0     Pain Loc --      Pain Edu? --      Excl. in GC? --    Vitals:   09/05/20 1118 09/05/20  1120  BP: (!) 158/99 (!) 155/91  Pulse:  82  Resp: 13 16  Temp:    SpO2:  95%   Physical Exam Vitals and nursing note reviewed.  Constitutional:      Appearance: He is well-developed. He is obese.  HENT:     Head: Normocephalic and atraumatic.     Right Ear: External ear normal.     Left Ear: External ear normal.     Nose: Nose normal.     Mouth/Throat:     Mouth: Mucous membranes are moist.  Eyes:     Conjunctiva/sclera: Conjunctivae normal.  Cardiovascular:     Rate and Rhythm: Normal rate and regular rhythm.     Pulses: Normal pulses.     Heart sounds: No murmur heard.   Pulmonary:     Effort: Pulmonary effort is normal. No respiratory distress.     Breath sounds: Normal breath sounds.  Abdominal:     Palpations: Abdomen  is soft.     Tenderness: There is no abdominal tenderness.  Musculoskeletal:     Cervical back: Neck supple.  Skin:    General: Skin is warm and dry.     Capillary Refill: Capillary refill takes less than 2 seconds.  Neurological:     Mental Status: He is alert and oriented to person, place, and time.  Psychiatric:        Mood and Affect: Mood normal.     Cranial nerves II through XII grossly intact.  Patient has some left-sided pronator drift.  He also has some mild left-sided finger dysmetria.  Right upper extremity is unremarkable.  Sensation is intact light touch of all extremities.  Patient has slightly decreased drink in his left lower extremity on hip flexion but otherwise has full strength of left leg.  Right upper and right lower extremity have full strength. ____________________________________________   LABS (all labs ordered are listed, but only abnormal results are displayed)  Labs Reviewed  COMPREHENSIVE METABOLIC PANEL - Abnormal; Notable for the following components:      Result Value   Glucose, Bld 115 (*)    BUN 21 (*)    Creatinine, Ser 1.31 (*)    All other components within normal limits  URINALYSIS, ROUTINE W REFLEX MICROSCOPIC - Abnormal; Notable for the following components:   Color, Urine STRAW (*)    APPearance CLEAR (*)    Hgb urine dipstick SMALL (*)    Protein, ur 30 (*)    All other components within normal limits  I-STAT CREATININE, ED - Abnormal; Notable for the following components:   Creatinine, Ser 1.30 (*)    All other components within normal limits  RESP PANEL BY RT-PCR (FLU A&B, COVID) ARPGX2  ETHANOL  PROTIME-INR  APTT  CBC  DIFFERENTIAL  URINE DRUG SCREEN, QUALITATIVE ([facility] ONLY)  HIV ANTIBODY (ROUTINE TESTING W REFLEX)   ____________________________________________  EKG  Sinus rhythm with a ventricular rate of 86, normal axis, unremarkable intervals and some nonspecific ST and T wave changes in inferior lateral  leads. ____________________________________________  RADIOLOGY  ED MD interpretation: CT Noncon head shows no evidence of intracranial hemorrhage.  CT head and neck showed no evidence of significant stenosis, aneurysm or vessel occlusion.  There is evidence of aortic atherosclerosis.  Official radiology report(s): CT Angio Head W or Wo Contrast  Result Date: 09/05/2020 CLINICAL DATA:  Syncope. Left-sided weakness. Last known well 7 a.m. EXAM: CT ANGIOGRAPHY HEAD AND NECK TECHNIQUE: Multidetector CT imaging of the head and  neck was performed using the standard protocol during bolus administration of intravenous contrast. Multiplanar CT image reconstructions and MIPs were obtained to evaluate the vascular anatomy. Carotid stenosis measurements (when applicable) are obtained utilizing NASCET criteria, using the distal internal carotid diameter as the denominator. CONTRAST:  75mL OMNIPAQUE IOHEXOL 350 MG/ML SOLN COMPARISON:  CT head without contrast 09/05/2020. FINDINGS: CTA NECK FINDINGS Aortic arch: Common origin of the left common carotid artery and innominate artery noted. Single calcification is noted along the undersurface of the aorta. No aneurysm or stenosis present. Right carotid system: The right common carotid artery is within normal limits. Bifurcation is unremarkable. Cervical right ICA is normal. Left carotid system: The left common carotid artery is within normal limits. Bifurcation is unremarkable. Cervical left ICA is within normal limits. Vertebral arteries: The left vertebral artery is the dominant vessel. Both vertebral arteries originate from the subclavian arteries without significant stenosis. No significant stenosis is present in either vertebral artery in the neck. Skeleton: Vertebral body heights and alignment are normal. Other neck: No focal mucosal or submucosal lesions are present. No significant adenopathy is present. Salivary glands are within normal limits. Upper chest: Mild  dependent atelectasis is present. Lungs are otherwise clear. Thoracic inlet is within normal limits. Review of the MIP images confirms the above findings CTA HEAD FINDINGS Anterior circulation: The internal carotid arteries are within normal limits from the high cervical segments through the ICA termini bilaterally. The A1 and M1 segments are normal. The anterior communicating artery is patent. MCA bifurcations are within normal limits bilaterally. The ACA and MCA branch vessels are normal. Posterior circulation: The left vertebral artery is the dominant vessel. PICA origins are visualized and normal. Vertebrobasilar junction is normal. Basilar artery is within normal limits. Left posterior cerebral artery originates from the basilar tip. The right posterior cerebral artery is of fetal type. A small right P1 segment contributes PCA branch vessels are within normal limits. Venous sinuses: The dural sinuses are patent. The straight sinus and deep cerebral veins are intact. Cortical veins are unremarkable. No vascular malformation is evident. Anatomic variants: None Review of the MIP images confirms the above findings IMPRESSION: 1. Normal variant CTA Circle of Willis without significant proximal stenosis, aneurysm, or branch vessel occlusion. 2. Normal CTA of the neck. 3. Aortic Atherosclerosis (ICD10-I70.0). Electronically Signed   By: Marin Roberts M.D.   On: 09/05/2020 10:21   CT Angio Neck W and/or Wo Contrast  Result Date: 09/05/2020 CLINICAL DATA:  Syncope. Left-sided weakness. Last known well 7 a.m. EXAM: CT ANGIOGRAPHY HEAD AND NECK TECHNIQUE: Multidetector CT imaging of the head and neck was performed using the standard protocol during bolus administration of intravenous contrast. Multiplanar CT image reconstructions and MIPs were obtained to evaluate the vascular anatomy. Carotid stenosis measurements (when applicable) are obtained utilizing NASCET criteria, using the distal internal carotid  diameter as the denominator. CONTRAST:  75mL OMNIPAQUE IOHEXOL 350 MG/ML SOLN COMPARISON:  CT head without contrast 09/05/2020. FINDINGS: CTA NECK FINDINGS Aortic arch: Common origin of the left common carotid artery and innominate artery noted. Single calcification is noted along the undersurface of the aorta. No aneurysm or stenosis present. Right carotid system: The right common carotid artery is within normal limits. Bifurcation is unremarkable. Cervical right ICA is normal. Left carotid system: The left common carotid artery is within normal limits. Bifurcation is unremarkable. Cervical left ICA is within normal limits. Vertebral arteries: The left vertebral artery is the dominant vessel. Both vertebral arteries originate from the  subclavian arteries without significant stenosis. No significant stenosis is present in either vertebral artery in the neck. Skeleton: Vertebral body heights and alignment are normal. Other neck: No focal mucosal or submucosal lesions are present. No significant adenopathy is present. Salivary glands are within normal limits. Upper chest: Mild dependent atelectasis is present. Lungs are otherwise clear. Thoracic inlet is within normal limits. Review of the MIP images confirms the above findings CTA HEAD FINDINGS Anterior circulation: The internal carotid arteries are within normal limits from the high cervical segments through the ICA termini bilaterally. The A1 and M1 segments are normal. The anterior communicating artery is patent. MCA bifurcations are within normal limits bilaterally. The ACA and MCA branch vessels are normal. Posterior circulation: The left vertebral artery is the dominant vessel. PICA origins are visualized and normal. Vertebrobasilar junction is normal. Basilar artery is within normal limits. Left posterior cerebral artery originates from the basilar tip. The right posterior cerebral artery is of fetal type. A small right P1 segment contributes PCA branch  vessels are within normal limits. Venous sinuses: The dural sinuses are patent. The straight sinus and deep cerebral veins are intact. Cortical veins are unremarkable. No vascular malformation is evident. Anatomic variants: None Review of the MIP images confirms the above findings IMPRESSION: 1. Normal variant CTA Circle of Willis without significant proximal stenosis, aneurysm, or branch vessel occlusion. 2. Normal CTA of the neck. 3. Aortic Atherosclerosis (ICD10-I70.0). Electronically Signed   By: Marin Robertshristopher  Mattern M.D.   On: 09/05/2020 10:21   CT HEAD CODE STROKE WO CONTRAST  Result Date: 09/05/2020 CLINICAL DATA:  Code stroke. Syncope. Left-sided weakness. Last known well at 7 o'clock a.m. EXAM: CT HEAD WITHOUT CONTRAST TECHNIQUE: Contiguous axial images were obtained from the base of the skull through the vertex without intravenous contrast. COMPARISON:  None. FINDINGS: Brain: Remote lacunar infarct is present within the genu of the left internal capsule. Remote ischemia also noted in the left thalamus. A remote lacunar infarct is present in the white matter adjacent to the atrium of the left lateral ventricle. Moderate periventricular white matter hypoattenuation is present in the high parietal lobes bilaterally. Basal ganglia are otherwise intact. Insular ribbon is normal. No acute or focal cortical abnormality is present. No acute hemorrhage or mass lesion is present. The ventricles are of normal size. No significant extraaxial fluid collection is present. The brainstem and cerebellum are within normal limits. Vascular: No hyperdense vessel or unexpected calcification. Skull: Calvarium is intact. No focal lytic or blastic lesions are present. No significant extracranial soft tissue lesion is present. Sinuses/Orbits: The paranasal sinuses and mastoid air cells are clear. The globes and orbits are within normal limits. ASPECTS [facility]([place] Stroke Program Early CT Score) - Ganglionic level infarction  (caudate, lentiform nuclei, internal capsule, insula, M1-M3 cortex): 7/7 - Supraganglionic infarction (M4-M6 cortex): 3/3 Total score (0-10 with 10 being normal): 10/10 IMPRESSION: 1. No acute infarct. 2. Remote white matter infarcts on the left as described. 3. Moderate periventricular white matter hypoattenuation most prominent in the high parietal lobes. This likely reflects the sequela of chronic microvascular ischemia. 4. ASPECTS is 10/10 The above was relayed via text pager to Dr. Iver NestleBhagat on 09/05/2020 at 10:04 . Electronically Signed   By: Marin Robertshristopher  Mattern M.D.   On: 09/05/2020 10:07    ____________________________________________   PROCEDURES  Procedure(s) performed (including Critical Care):  .Critical Care Performed by: Gilles ChiquitoSmith, Keyon Winnick P, MD Authorized by: Gilles ChiquitoSmith, Fleetwood Pierron P, MD   Critical care provider statement:  Critical care time (minutes):  45   Critical care time was exclusive of:  Separately billable procedures and treating other patients   Critical care was necessary to treat or prevent imminent or life-threatening deterioration of the following conditions:  CNS failure or compromise   Critical care was time spent personally by me on the following activities:  Discussions with consultants, evaluation of patient's response to treatment, examination of patient, ordering and performing treatments and interventions, ordering and review of laboratory studies, ordering and review of radiographic studies, pulse oximetry, re-evaluation of patient's condition, obtaining history from patient or surrogate and review of old charts     ____________________________________________   INITIAL IMPRESSION / ASSESSMENT AND PLAN / ED COURSE       Patient resents with above to history exam for assessment of acute onset of left arm and leg weakness that began around 7 AM today.  On arrival he is afebrile and hypertensive with BP of 231/126 with otherwise stable vital signs on room air.  On  exam he does have some left-sided pronator drift and finger dysmetria with some very slight weakness on left hip flexion.  No other obvious focal deficits.  Given onset of symptoms within 6 hours patient made code stroke.  In addition to CVA additional differential includes hypertensive emergency and metabolic derangements.  No history exam findings to suggest acute traumatic process or acute infectious process.  CT Noncon shows no evidence intracranial hemorrhage or other clear acute process.  CTA shows no large vessel occlusion stenosis or other clear acute intracranial process.  Serum ethanol unremarkable.  CMP remarkable for no significant electrolyte or metabolic derangements.  UDS is negative.  CBC shows no significant derangements.  UA is a small hemoglobin and 30 protein.  Patient treated with labetalol for hypertension on arrival and made code stroke.  After being evaluated by neurology consultant Dr. Iver Nestle patient patient started on TPA per neurology recommendation.  Patient will be admitted to medical ICU for further evaluation management.      ____________________________________________   FINAL CLINICAL IMPRESSION(S) / ED DIAGNOSES  Final diagnoses:  Cerebrovascular accident (CVA), unspecified mechanism (HCC)  Hypertensive emergency    Medications  clevidipine (CLEVIPREX) 0.5 MG/ML infusion (0 mg/hr  Hold 09/05/20 1125)  clevidipine (CLEVIPREX) infusion 0.5 mg/mL (6 mg/hr Intravenous Rate/Dose Change 09/05/20 1046)  labetalol (NORMODYNE) injection 10 mg (has no administration in time range)   stroke: mapping our early stages of recovery book (0 each Does not apply Hold 09/05/20 1124)  0.9 %  sodium chloride infusion (0 mLs Intravenous Hold 09/05/20 1124)  acetaminophen (TYLENOL) tablet 650 mg (has no administration in time range)    Or  acetaminophen (TYLENOL) 160 MG/5ML solution 650 mg (has no administration in time range)    Or  acetaminophen (TYLENOL) suppository 650 mg  (has no administration in time range)  senna-docusate (Senokot-S) tablet 1 tablet (has no administration in time range)  pantoprazole (PROTONIX) injection 40 mg (has no administration in time range)  labetalol (NORMODYNE) injection 10 mg (10 mg Intravenous Given 09/05/20 1007)  iohexol (OMNIPAQUE) 350 MG/ML injection 75 mL (75 mLs Intravenous Contrast Given 09/05/20 0948)  alteplase (ACTIVASE) 1 mg/mL infusion 90 mg (0 mg Intravenous Stopped 09/05/20 1122)    Followed by  0.9 %  sodium chloride infusion (50 mLs Intravenous New Bag/Given 09/05/20 1121)     ED Discharge Orders    None       Note:  This document was prepared using Dragon voice recognition  software and may include unintentional dictation errors.   Gilles Chiquito, MD 09/05/20 [phone]

## 2020-09-05 NOTE — Progress Notes (Signed)
OT Cancellation Note  Patient Details Name: Darryl Sullivan MRN: [id number] DOB: 12/03/1965   Cancelled Treatment:    Reason Eval/Treat Not Completed: Active bedrest order. Consult received and chart reviewed.  Patient admitted to ICU for CVA work up with tPA infusion (ending at 11:22am, 09/05/20).  Per guidelines, to be on strict bedrest x24 hours post infusion.  Will continue to follow and initiate as medically appropriate pending repeat imaging.  Wynona Canes, MPH, MS, OTR/L ascom [phone] 09/05/20, 1:44 PM

## 2020-09-05 NOTE — ED Notes (Signed)
Called icu was told there is no bed available for patient, be shows ready on board , ed charge rn aware

## 2020-09-05 NOTE — Progress Notes (Signed)
*  PRELIMINARY RESULTS* Echocardiogram 2D Echocardiogram has been performed.  Darryl Sullivan 09/05/2020, 6:38 PM

## 2020-09-05 NOTE — ED Notes (Signed)
Family at bedside. 

## 2020-09-05 NOTE — Progress Notes (Addendum)
SLP Cancellation Note  Patient Details Name: AVYUKT CIMO MRN: [id number] DOB: 1966/03/18   Cancelled treatment:       Reason Eval/Treat Not Completed: SLP screened, no needs identified, will sign off (chart reviewed, consulted NSG then met w/ pt in room ED). Pt denied any difficulty swallowing; tolerates swallowing pills w/ water per NSG. Pt conversed at conversational level w/out deficits noted; pt and NSG denied any speech-language deficits. Pt denied any deficits using his phone(in lap) except that his LUE was weak and could not hold the phone securely. OT and PT following for LEs weakness. No further skilled ST services indicated as pt appears at his baseline for speech and swallowing. MD notified re: pt passing the [facility] swallow screen and for the diet to be ordered by MD when ready for an oral diet. Recommend general aspiration precautions w/ any oral intake. MD agreed. Pt agreed. NSG to reconsult if any change in status while admitted.      Orinda Kenner, MS, CCC-SLP Speech Language Pathologist Rehab Services [phone] [facility] 09/05/2020, 4:16 PM

## 2020-09-05 NOTE — ED Notes (Signed)
Pt changed of bm, peri care provided, warm blanket provided

## 2020-09-05 NOTE — Progress Notes (Signed)
CODE STROKE- PHARMACY COMMUNICATION   Time CODE STROKE called/page received: 0946  Time response to CODE STROKE was made (in person or via phone): [phone]  Time Stroke Kit retrieved from [facility] (only if needed): 0950  Name of Provider/Nurse contacted: Dr. Curly Shores; Jana Half, RN   Tawnya Crook ,PharmD Clinical Pharmacist  09/05/2020  10:31 AM

## 2020-09-05 NOTE — ED Notes (Signed)
Pt to CT

## 2020-09-05 NOTE — ED Notes (Signed)
NP at bedside.

## 2020-09-05 NOTE — ED Notes (Signed)
Code  stroke  called  to  [facility] 

## 2020-09-05 NOTE — H&P (Signed)
NAMEJAROM Sullivan, MRN:  [id number], DOB:  April 24, 1966, LOS: 0 ADMISSION DATE:  09/05/2020, CONSULTATION DATE:  09/05/2020 REFERRING MD:  Dr. Katrinka Blazing, CHIEF COMPLAINT:  Presyncope, Left sided weakness   Brief Pt Description:  55 y.o. Male who presented 09/06/2019 with Acute CVA (Left sided weakness) status post tPA administration in the ED, along with Hypertensive Emergency requiring Clevidipine drip. Admitted to ICU for close Neurological and BP monitoring s/p tPA.  History of Present Illness:  Darryl Sullivan is a 55 year old male with a past medical history significant for tobacco abuse who presents to [facility] ED on 09/05/2020 due to complaints of left-sided weakness and presyncope.   He reports that at work this morning he developed acute onset of left arm and leg weakness at approximately 07:00 a.m.  He denies facial asymmetry, slurred speech, headache, chest pain, palpitations, shortness of breath, cough, neck pain, back pain, dysuria.  Denies any history of stroke, high blood pressure, diabetes mellitus, alcohol or illicit drug abuse.  He is now on any blood thinners.  ED course: Upon arrival to the ED he was afebrile and severely hypertensive with blood pressure 231/126, otherwise stable vital signs on room air.  He was noted to have some left-sided pronator drift and finger disc dysmetria with some very slight weakness of the left hip flexion, otherwise no other obvious focal deficits.  Given the acute onset of his symptoms and within 4-hour timeframe, code stroke was initiated.  Neurology evaluated the patient and he was deemed a candidate for TPA.  CT head without contrast shows no evidence of intracranial hemorrhage or other acute process.  tPA was initiated at 10:23 AM.  CTA head and neck shows no large vessel occlusion stenosis or other acute intracranial process.  Serum ethanol is negative.  Urine drug screen is negative.  Urinalysis does show small hemoglobin and protein 30.  Blood work is  unremarkable except for BUN 21 and creatinine 1.31.  His COVID-19 and influenza PCR's are both negative.  Given his severe hypertension and TPA administration, he was given IV labetalol and placed on clevidipine infusion.  PCCM is asked to admit the patient for further work-up and treatment of Acute CVA status post tPA, along with Hypertensive Emergency requiring Clevidipine drip    Pertinent  Medical History  Tobacco abuse  Significant Hospital Events: Including procedures, antibiotic start and stop dates in addition to other pertinent events   . 3/28: s/p tPA administration in the ED.  Admitted to ICU for frequent Neurological and BP monitoring.  Interim History / Subjective:  -Presented to ED this morning with left sided weakness (acute onset @ 0700) -Status post tPA @ 10:23 am on 09/05/20 -Remains Hypertensive, currently on clevidipine infusion -Afebrile, on room air -Patient reports left-sided weakness slowly improving -Denies headache, chest pain, palpitations, shortness of breath, cough, fever, chills, abdominal pain, nausea, vomiting, diarrhea  Objective   Blood pressure (!) 173/89, pulse 85, temperature 98.9 F (37.2 C), temperature source Oral, resp. rate 15, height 5\' 7"  (1.702 m), weight 108.2 kg, SpO2 94 %.       No intake or output data in the 24 hours ending 09/05/20 1104 Filed Weights   09/05/20 0935 09/05/20 1006  Weight: 103 kg 108.2 kg    Examination: General: Acutely ill-appearing male, laying in bed, on room air, no acute distress HENT: Atraumatic, normocephalic, neck supple, no JVD Lungs: Clear to auscultation bilaterally, no wheezing or rales noted, even, nonlabored, normal effort Cardiovascular: Regular rate and  rhythm, S1-S2, no murmurs, rubs, gallops, 2+ distal pulses Abdomen: Obese, soft, nontender, nondistended, no guarding rebound tenderness, bowel sounds positive x4 Extremities: No deformities, no edema, no clubbing Neuro: Awake, alert and  oriented x4, follows commands, speech clear, does have some left-sided pronator drift and some mild left-sided finger dysmetria right upper and lower extremities are remarkable, sensation is intact all extremities GU: Deferred Skin: Warm and dry.  No obvious rashes, lesions, ulcerations  Labs/imaging that I havepersonally reviewed  (right click and "Reselect all SmartList Selections" daily)  3/28: CT Head w/o contrast>>no evidence intracranial hemorrhage or other clear acute process 3/28: CTA Head/Neck>>1. Normal variant CTA Circle of Willis without significant proximal stenosis, aneurysm, or branch vessel occlusion. 2. Normal CTA of the neck. 3. Aortic Atherosclerosis   Resolved Hospital Problem list     Assessment & Plan:   Acute Ischemic Stroke status post tPA Hypertensive emergency -ICU monitoring -Frequent Neuro checks as per tPA protocol -Strict blood pressure control: Maintain BP <180/105 (Goal SBP 160-180) for 1st 24 hrs s/p tPA -PRN Labetalol IV pushes & Clevidipine gtt as needed to maintain goal BP -Neurology following, appreciate input -No chemical DVT prophylaxis or antiplatelets for the 1st 24 hours  -Antiplatelet therapy to be started 24 hrs post tPA -Repeat CT head 24 hours post TPA -MRI brain -Echocardiogram pending -Check lipid panel and hemoglobin A1c -PT, OT, Speech consults -Encourage smoking cessation -Urine drug screen is negative   Acute Kidney Injury -Monitor I&O's / urinary output -Follow BMP -Ensure adequate renal perfusion -Avoid nephrotoxic agents as able -Replace electrolytes as indicated -IV fluids -Obtain US Kidneys  Best practice (right click and "Reselect all SmartList Selections" daily)  Diet:  NPO Pain/Anxiety/Delirium protocol (if indicated): No VAP protocol (if indicated): Not indicated DVT prophylaxis: SCD GI prophylaxis: PPI Glucose control:  SSI No Central venous access:  N/A Arterial line:  N/A Foley:  N/A Mobility:  bed  rest  PT consulted: Yes Last date of multidisciplinary goals of care discussion [09/05/2020] Code Status:  full code Disposition: ICU  Updated pt and his sister at bedside 09/05/20  Labs   CBC: Recent Labs  Lab 09/05/20 0937  WBC 7.2  NEUTROABS 5.2  HGB 13.7  HCT 43.2  MCV 89.4  PLT 194    Basic Metabolic Panel: Recent Labs  Lab 09/05/20 0937 09/05/20 0955  NA 137  --   K 4.2  --   CL 107  --   CO2 23  --   GLUCOSE 115*  --   BUN 21*  --   CREATININE 1.31* 1.30*  CALCIUM 9.4  --    GFR: Estimated Creatinine Clearance: 75.3 mL/min (A) (by C-G formula based on SCr of 1.3 mg/dL (H)). Recent Labs  Lab 09/05/20 0937  WBC 7.2    Liver Function Tests: Recent Labs  Lab 09/05/20 0937  AST 23  ALT 32  ALKPHOS 58  BILITOT 0.9  PROT 7.5  ALBUMIN 4.4   No results for input(s): LIPASE, AMYLASE in the last 168 hours. No results for input(s): AMMONIA in the last 168 hours.  ABG No results found for: PHART, PCO2ART, PO2ART, HCO3, TCO2, ACIDBASEDEF, O2SAT   Coagulation Profile: Recent Labs  Lab 09/05/20 0937  INR 1.1    Cardiac Enzymes: No results for input(s): CKTOTAL, CKMB, CKMBINDEX, TROPONINI in the last 168 hours.  HbA1C: No results found for: HGBA1C  CBG: No results for input(s): GLUCAP in the last 168 hours.  Review of Systems:   Positives  in BOLD: Gen: Denies fever, chills, weight change, fatigue, night sweats HEENT: Denies blurred vision, double vision, hearing loss, tinnitus, sinus congestion, rhinorrhea, sore throat, neck stiffness, dysphagia PULM: Denies shortness of breath, cough, sputum production, hemoptysis, wheezing CV: Denies chest pain, edema, orthopnea, paroxysmal nocturnal dyspnea, palpitations GI: Denies abdominal pain, nausea, vomiting, diarrhea, hematochezia, melena, constipation, change in bowel habits GU: Denies dysuria, hematuria, polyuria, oliguria, urethral discharge Endocrine: Denies hot or cold intolerance, polyuria,  polyphagia or appetite change Derm: Denies rash, dry skin, scaling or peeling skin change Heme: Denies easy bruising, bleeding, bleeding gums Neuro: Denies headache, numbness, weakness, slurred speech, loss of memory or consciousness   Past Medical History:  He,  has no past medical history on file.   Surgical History:   Past Surgical History:  Procedure Laterality Date  . NO PAST SURGERIES       Social History:   reports that he has been smoking cigarettes. He has been smoking about 0.25 packs per day. His smokeless tobacco use includes chew. He reports that he does not drink alcohol and does not use drugs.   Family History:  His family history includes Diabetes in his mother.   Allergies Allergies  Allergen Reactions  . Penicillins Hives  . Tramadol Hives     Home Medications  Prior to Admission medications   Medication Sig Start Date End Date Taking? Authorizing Provider  hydrochlorothiazide (HYDRODIURIL) 12.5 MG tablet Take 1 tablet (12.5 mg total) by mouth daily. Patient not taking: Reported on 09/07/2014 02/12/14 01/29/19  Reuben Likes, MD     Critical care time: 50 minutes    Darryl Sullivan, [facility] Pager: [phone]

## 2020-09-05 NOTE — ED Triage Notes (Signed)
Pt to ED [facility] for hypertension and tingling in left arm that started at 0700 this am. LKW 0645.   Denies cp, headache Dizziness with standing

## 2020-09-05 NOTE — Consult Note (Signed)
Neurology Consultation Reason for Consult: code stroke Requesting Physician: Antoine Primas  CC: Presyncope followed by left sided weakness, arm and leg  History is obtained from: Patient and chart review   HPI: Darryl Sullivan is a 55 y.o. male with a past medical history significant for limited interface with healthcare, tobacco abuse (0.25 packs/day), former alcohol use, presenting with sudden onset left arm and leg weakness.  Reports he was normal when he woke up in the morning at about 5:45 AM.  While he was at work at 9 AM he suddenly had a feeling that he was going to pass out and had left-sided weakness for which code stroke was activated.  Review of systems included new onset headache the last 2 weeks, snoring at night but no waking up gasping for breath, though headache is worse in the morning.   Pre-TPA checklist was reviewed including Time to administration is within 4.5 hours from symptom onset Patient reporting no recent medical procedures  Patient reporting no symptoms of bleeding including GI/GU bleeding or bloody emesis Patient reporting no known history of aneurysms or intracerebral hemorrhage Patient reporting no recent heart attack or stroke Patient reporting no history of coagulopathy, no anticoagulant use, and no history of thrombocytopenia  If there is a history of thrombocytopenia, platelet count greater than 100 is confirmed  If there is a history of coagulopathy or anticoagulant use that would affect INR, INR is confirmed to be less than 1.7 prior to TPA administration For women, beta-hCG negative for patient reporting certainty that they are not pregnant No concern on clinical history for endocarditis Glucose greater than 50 NIH stroke scale is less than 25 and symptoms are felt to be potentially disabling Verbal consent was obtained from the patient or surrogate, detailing that tPA increases the risk of hemorrhagic conversion, but is associated with improved  recovery from stroke symptoms and that it is being offered in this case due to clinical assessment that the benefits are outweighed by the risks  LKW: 9 AM tPA given?:  Yes, started at 1023 at [facility] ED room 4 Premorbid modified rankin scale:      0 - No symptoms.  ROS: All other review of systems was negative except as noted in the HPI.   No past medical history on file. He reports he takes no medications and does not have any known medical problems but has not seen a doctor anytime recently   Family History  Problem Relation Age of Onset   Diabetes Mother   Additionally reports a history of hypertension in his father, 4 healthy sisters, one healthy brother, 2 healthy children currently aged 70 and 32, family history negative for strokes or diabetes  Current Outpatient Medications  Medication Instructions   Social History:  reports that he has been smoking cigarettes. He has been smoking about 0.25 packs per day. His smokeless tobacco use includes chew. He reports that he does not drink alcohol and does not use drugs.   Exam: Current vital signs: BP (!) 231/126 (BP Location: Right Arm)   Pulse 99   Temp 98.9 F (37.2 C) (Oral)   Resp 16   Ht 5\' 7"  (1.702 m)   Wt 103 kg   SpO2 98%   BMI 35.55 kg/m  Vital signs in last 24 hours: Temp:  [98.9 F (37.2 C)] 98.9 F (37.2 C) (03/28 0936) Pulse Rate:  [99] 99 (03/28 0936) Resp:  [16] 16 (03/28 0936) BP: (231)/(126) 231/126 (03/28 0936) SpO2:  [98 %]  98 % (03/28 0936) Weight:  [448 kg] 103 kg (03/28 0935)   Physical Exam  Constitutional: Appears well-developed and well-nourished, obese.  Psych: Affect appropriate to situation, calm and cooperative, occasionally mildly anxious Eyes: No scleral injection HENT: No oropharyngeal obstruction.  MSK: no joint deformities.  Cardiovascular: Normal rate and regular rhythm.  Respiratory: Effort normal, non-labored breathing GI: Soft.  No distension. There is no tenderness.  Skin:  Warm dry and intact visible skin  Neuro: Mental Status: Patient is awake, alert, oriented to person, place, month, year, and situation. Patient is able to give a clear and coherent history. No signs of aphasia or neglect Cranial Nerves: II: Visual Fields are full. Pupils are equal, round, and reactive to light.   III,IV, VI: EOMI without ptosis or diploplia.  V: Facial sensation is symmetric to light touch VII: Facial movement is symmetric.  VIII: hearing is intact to voice X: Uvula elevates symmetrically XI: Shoulder shrug is symmetric. XII: tongue is midline without atrophy or fasciculations.  Motor: Tone is normal. Bulk is normal. Mild pronator drift of the LUE, both LE could maintain antigravity for 5 seconds, but left was slower to rise than the right Sensory: Sensation is symmetric to light touch and temperature in the arms and legs. Cerebellar: FNF and HKS are intact bilaterally  NIHSS total 2 at time of head CT Score breakdown: 2 points (1 point for left arm drift, 1 point for unable to repeat "no ifs ands or buts" -- mild aphasia)  I have reviewed labs in epic and the results pertinent to this consultation are: I-stat Cr 1.3, CMP creatinine 1.31 with a GFR greater than 60, BUN 21 Glucose 115, platelets 194 PT, INR and PTT all within normal limits UA notable for small hemoglobin pigment and proteinuria, negative for infection UDS pending  I have reviewed the images obtained:  HCT without acute intracranial process, some chronic microvascular disease CTA without a large vessel occlusion, right fetal PCA, very mild aortic arch atherosclerosis  Impression: This is a 55 year old man with past medical history significant for stroke risk factors of obesity and smoking, and limited interface with medical care presenting with likely lacunar stroke, within the tPA window.  Given potential for stuttering lacunar stroke with worsening of symptoms once outside of the tPA window,  decision was made to proceed with tPA.  Patient did require initiation of antihypertensive drip for blood pressure control and therefore will need ICU level monitoring  Recommendations:  # Lacunar stroke based on clinical assessment, s/p tPA - Stroke labs TSH, HIV, RPR, HgbA1c, fasting lipid panel - MRI brain  - Frequent neuro checks per standard post TPA protocol - Echocardiogram  - If patient remains stable, antiplatelet therapy to be started 24 hours post TPA administration - Risk factor modification, smoking cessation, diet, exercise counseling and medication adherence counseling - Telemetry monitoring - Blood pressure goal   - Post tPA for 24  hours < 180/105, given patient is likely chronically hypertensive, goal systolic blood pressure range will be 160-180 - PT consult, OT consult, Speech consult - ICU admission for close monitoring and blood pressure control - Neurology team to follow  Brooke Dare MD-PhD [facility] [phone] [facility] coverage for [facility] is from 8 AM to 4 AM in-house and 4 PM to 8 PM by telephone/video. 8 PM to 8 AM emergent questions or overnight urgent questions should be addressed to Teleneurology On-call or [facility] neurohospitalist; contact information can be found on AMION  Total  critical care time:  51 minutes   Critical care time was exclusive of separately billable procedures and treating other patients.   Critical care was necessary to treat or prevent imminent or life-threatening deterioration.   Critical care was time spent personally by me on the following activities: development of treatment plan with patient and/or surrogate as well as nursing, discussions with consultants/primary team, evaluation of patient's response to treatment, examination of patient, obtaining history from patient or surrogate, ordering and performing treatments and interventions, ordering and review of laboratory studies, ordering and review  of radiographic studies, and re-evaluation of patient's condition as needed, as documented above.

## 2020-09-06 ENCOUNTER — Inpatient Hospital Stay: Payer: BC Managed Care – PPO

## 2020-09-06 DIAGNOSIS — I161 Hypertensive emergency: Secondary | ICD-10-CM

## 2020-09-06 DIAGNOSIS — N179 Acute kidney failure, unspecified: Secondary | ICD-10-CM

## 2020-09-06 LAB — CBC
HCT: 43.9 % (ref 39.0–52.0)
Hemoglobin: 13.8 g/dL (ref 13.0–17.0)
MCH: 27.9 pg (ref 26.0–34.0)
MCHC: 31.4 g/dL (ref 30.0–36.0)
MCV: 88.9 fL (ref 80.0–100.0)
Platelets: 191 10*3/uL (ref 150–400)
RBC: 4.94 MIL/uL (ref 4.22–5.81)
RDW: 11.9 % (ref 11.5–15.5)
WBC: 8.1 10*3/uL (ref 4.0–10.5)
nRBC: 0 % (ref 0.0–0.2)

## 2020-09-06 LAB — APTT: aPTT: 33 seconds (ref 24–36)

## 2020-09-06 LAB — TSH: TSH: 0.608 u[IU]/mL (ref 0.350–4.500)

## 2020-09-06 LAB — HEMOGLOBIN A1C
Hgb A1c MFr Bld: 4.7 % — ABNORMAL LOW (ref 4.8–5.6)
Mean Plasma Glucose: 88.19 mg/dL

## 2020-09-06 LAB — BASIC METABOLIC PANEL
Anion gap: 8 (ref 5–15)
BUN: 22 mg/dL — ABNORMAL HIGH (ref 6–20)
CO2: 25 mmol/L (ref 22–32)
Calcium: 9.6 mg/dL (ref 8.9–10.3)
Chloride: 106 mmol/L (ref 98–111)
Creatinine, Ser: 1.43 mg/dL — ABNORMAL HIGH (ref 0.61–1.24)
GFR, Estimated: 58 mL/min — ABNORMAL LOW (ref >60)
Glucose, Bld: 113 mg/dL — ABNORMAL HIGH (ref 70–99)
Potassium: 3.5 mmol/L (ref 3.5–5.1)
Sodium: 139 mmol/L (ref 135–145)

## 2020-09-06 LAB — LIPID PANEL
Cholesterol: 238 mg/dL — ABNORMAL HIGH (ref 0–200)
HDL: 38 mg/dL — ABNORMAL LOW (ref >40)
LDL Cholesterol: 165 mg/dL — ABNORMAL HIGH (ref 0–99)
Total CHOL/HDL Ratio: 6.3 RATIO
Triglycerides: 174 mg/dL — ABNORMAL HIGH (ref <150)
VLDL: 35 mg/dL (ref 0–40)

## 2020-09-06 LAB — PHOSPHORUS: Phosphorus: 3.9 mg/dL (ref 2.5–4.6)

## 2020-09-06 LAB — RPR: RPR Ser Ql: NONREACTIVE

## 2020-09-06 LAB — PROTIME-INR
INR: 1.2 (ref 0.8–1.2)
Prothrombin Time: 14.5 seconds (ref 11.4–15.2)

## 2020-09-06 LAB — HIV ANTIBODY (ROUTINE TESTING W REFLEX): HIV Screen 4th Generation wRfx: NONREACTIVE

## 2020-09-06 LAB — MAGNESIUM: Magnesium: 2 mg/dL (ref 1.7–2.4)

## 2020-09-06 MED ORDER — ATORVASTATIN CALCIUM 20 MG PO TABS
80.0000 mg | ORAL_TABLET | Freq: Every day | ORAL | Status: DC
  Administered 2020-09-06 – 2020-09-13 (×8): 80 mg via ORAL
  Filled 2020-09-06 (×8): qty 4

## 2020-09-06 MED ORDER — AMLODIPINE BESYLATE 5 MG PO TABS
5.0000 mg | ORAL_TABLET | Freq: Every day | ORAL | Status: DC
  Administered 2020-09-06 – 2020-09-07 (×2): 5 mg via ORAL
  Filled 2020-09-06 (×2): qty 1

## 2020-09-06 MED ORDER — NICARDIPINE HCL IN NACL 20-0.86 MG/200ML-% IV SOLN
3.0000 mg/h | INTRAVENOUS | Status: DC
  Administered 2020-09-06 (×2): 5 mg/h via INTRAVENOUS
  Administered 2020-09-07 (×2): 12.5 mg/h via INTRAVENOUS
  Administered 2020-09-07: 10 mg/h via INTRAVENOUS
  Administered 2020-09-07: 5 mg/h via INTRAVENOUS
  Administered 2020-09-07: 12.5 mg/h via INTRAVENOUS
  Administered 2020-09-07 (×2): 7.5 mg/h via INTRAVENOUS
  Administered 2020-09-08: 2.5 mg/h via INTRAVENOUS
  Filled 2020-09-06 (×12): qty 200

## 2020-09-06 NOTE — Evaluation (Signed)
Physical Therapy Evaluation Patient Details Name: ZAKAR BROSCH MRN: [id number] DOB: 1965-09-28 Today's Date: 09/06/2020   History of Present Illness  Darryl Sullivan is a 55 year old male with a past medical history significant for tobacco abuse who presents to [facility] ED on 09/05/2020 due to complaints of left-sided weakness and presyncope.      He reports that at work this morning he developed acute onset of left arm and leg weakness at approximately 07:00 a.m.  He denies facial asymmetry, slurred speech, headache, chest pain, palpitations, shortness of breath, cough, neck pain, back pain, dysuria.  Denies any history of stroke, high blood pressure, diabetes mellitus, alcohol or illicit drug abuse.  He is now on any blood thinners. CT shows small areas of ischemia in R basal ganglia and L temporal white matter.  Clinical Impression  Patient received in bed sleeping. Wakes to me calling his name. He reports his left UE is weak. Reports L LE feeling "pretty normal". Patient has limited use of L UE and weak grip. LLE also weak as he is unable to perform AP on left, some increased tone in heel cord noted. Patient has moderate weakness of L LE with functional mobility. He requires min assist for bed mobility and cues. Min assist for sit to stand. Mod assist for pivot from bed to chair. Demonstrates L Knee buckling with attempt to step. Patient will continue to benefit from skilled PT while here to improve functional independence, safety and strength.        Follow Up Recommendations CIR;Supervision for mobility/OOB    Equipment Recommendations  None recommended by PT (TBD)    Recommendations for Other Services Rehab consult     Precautions / Restrictions Precautions Precautions: Fall Restrictions Weight Bearing Restrictions: No      Mobility  Bed Mobility Overal bed mobility: Needs Assistance Bed Mobility: Supine to Sit     Supine to sit: Min assist          Transfers Overall transfer  level: Needs assistance Equipment used: 1 person hand held assist Transfers: Sit to/from [employer] Sit to Stand: Min assist Stand pivot transfers: Mod assist       General transfer comment: Patient stood with min assist. When attempting to take a step noted left LE buckling and patient assisted back to sitting on side of bed. Patient then able to stand again and pivot to recliner to his right side.  Ambulation/Gait             General Gait Details: unable  Stairs            Wheelchair Mobility    Modified Rankin (Stroke Patients Only)       Balance Overall balance assessment: Needs assistance Sitting-balance support: Feet supported Sitting balance-Leahy Scale: Good     Standing balance support: Single extremity supported;During functional activity Standing balance-Leahy Scale: Poor Standing balance comment: patient reliant on single UE support and min assist for safety wtih transfer                             Pertinent Vitals/Pain Pain Assessment: No/denies pain    Home Living Family/patient expects to be discharged to:: Inpatient rehab Living Arrangements: Other relatives Available Help at Discharge: Family;Available PRN/intermittently Type of Home: House Home Access: Stairs to enter   [employer] of Steps: a few steps with rail Home Layout: One level Home Equipment: None      Prior Function  Level of Independence: Independent               Hand Dominance        Extremity/Trunk Assessment   Upper Extremity Assessment Upper Extremity Assessment: Defer to OT evaluation    Lower Extremity Assessment Lower Extremity Assessment: LLE deficits/detail LLE Deficits / Details: weak LLE Sensation: decreased light touch LLE Coordination: decreased gross motor;decreased fine motor    Cervical / Trunk Assessment Cervical / Trunk Assessment: Normal  Communication   Communication: Expressive  difficulties;Other (comment) (mumbling at times)  Cognition Arousal/Alertness: Awake/alert Behavior During Therapy: WFL for tasks assessed/performed Overall Cognitive Status: Within Functional Limits for tasks assessed                                        General Comments      Exercises     Assessment/Plan    PT Assessment Patient needs continued PT services  PT Problem List Decreased strength;Decreased mobility;Decreased activity tolerance;Decreased balance;Decreased knowledge of use of DME;Decreased knowledge of precautions;Impaired tone       PT Treatment Interventions DME instruction;Therapeutic exercise;Gait training;Balance training;Neuromuscular re-education;Functional mobility training;Therapeutic activities;Patient/family education    PT Goals (Current goals can be found in the Care Plan section)  Acute Rehab PT Goals Patient Stated Goal: to improve PT Goal Formulation: With patient Time For Goal Achievement: 09/20/20 Potential to Achieve Goals: Good    Frequency 7X/week   Barriers to discharge Decreased caregiver support;Inaccessible home environment      Co-evaluation               AM-PAC PT "6 Clicks" Mobility  Outcome Measure Help needed turning from your back to your side while in a flat bed without using bedrails?: A Lot Help needed moving from lying on your back to sitting on the side of a flat bed without using bedrails?: A Lot Help needed moving to and from a bed to a chair (including a wheelchair)?: A Lot Help needed standing up from a chair using your arms (e.g., wheelchair or bedside chair)?: A Little Help needed to walk in hospital room?: Total Help needed climbing 3-5 steps with a railing? : Total 6 Click Score: 11    End of Session Equipment Utilized During Treatment: Gait belt Activity Tolerance: Patient tolerated treatment well Patient left: in chair;with call bell/phone within reach Nurse Communication: Mobility  status PT Visit Diagnosis: Unsteadiness on feet (R26.81);Other abnormalities of gait and mobility (R26.89);Muscle weakness (generalized) (M62.81);Difficulty in walking, not elsewhere classified (R26.2);Hemiplegia and hemiparesis Hemiplegia - Right/Left: Left Hemiplegia - caused by: Cerebral infarction    Time: 1310-1348 PT Time Calculation (min) (ACUTE ONLY): 38 min   Charges:   PT Evaluation $PT Eval Moderate Complexity: 1 Mod PT Treatments $Therapeutic Activity: 8-22 mins        Talisha Erby, PT, GCS 09/06/20,2:06 PM

## 2020-09-06 NOTE — Progress Notes (Signed)
Patient stable throughout day. Q1hr neuro assessments completed, transitioned to Q2hr neuro assessments at 1200 and will continue until 2000. Repeat head CT completed this AM. Initial SBP goal of 160-180 decreased due to small hemorrhagic conversion seen on CT scan. Clevidipine infusion restarted and titrated for new SBP goal < 160 per neurology provider (see note). Patient OOB to chair, stand and pivot with PT (one assist required). Pt had one episode of SVT with HR in the 150s while sitting in the chair. Patient asymptomatic during this event, but did become more hypertensive. PRN labetalol given, with reduction in both HR and SBP. Patient intermittently tearful this AM, verbally expresses feelings of being overwhelmed by all that has occurred in the last 24hrs. Family visited at bedside today and has frequently called to check in on him. Stroke education started, discussed contributing factors, lifestyle changes to reduce risks, and importance of BP control and medication adherence. Will continue to offer support and reinforcement of education as needed.

## 2020-09-06 NOTE — Plan of Care (Signed)
Stroke education started with patient and his family while they visited at the bedside. Discussed contributing and risk factors for stroke, lifestyle changes that can reduce risks, importance of BP control and medication adherence. Patient educated on new medications that will be required upon discharge. PT/OT consulted and patient participated in both therapies, getting OOB to chair for several hours. VS remain stable, BP controlled with clevidipine infusion and PRN labetalol. Patient remains on RA. Denies SOB, pain or discomfort throughout the day.

## 2020-09-06 NOTE — Evaluation (Signed)
Occupational Therapy Evaluation Patient Details Name: Darryl Sullivan MRN: [id number] DOB: May 28, 1966 Today's Date: 09/06/2020    History of Present Illness Darryl Sullivan is a 55 year old male with a past medical history significant for tobacco abuse who presents to [facility] ED on 09/05/2020 due to complaints of left-sided weakness and presyncope.      He reports that at work this morning he developed acute onset of left arm and leg weakness at approximately 07:00 a.m.  He denies facial asymmetry, slurred speech, headache, chest pain, palpitations, shortness of breath, cough, neck pain, back pain, dysuria.  Denies any history of stroke, high blood pressure, diabetes mellitus, alcohol or illicit drug abuse.  He is now on any blood thinners. CT shows small areas of ischemia in R basal ganglia and L temporal white matter.   Clinical Impression   Pt seen for OT evaluation this date. Prior to hospital admission, pt was independent in all aspects of ADL and working in [employer]. Pt lives with his nephew and nephew's spouse in a one-level home with a few steps to enter. Pt eager to participate in therapy and motivated to get better. Currently pt demonstrates significant impairments in LUE/LLE strength and GMC/FMC. Pt requires mod-max assist for UB and LB bathing and dressing, mod assist for seated grooming incorporating BUE into task due to L side deficits. Pt instructed in multiple partial STS transfers from recliner with emphasis on sustaining muscle activation with weightbearing through all LE/UE joints. PROM and AAROM of LUE in preparation for self ROM activities with pt able to return demo proper technique. Pt instructed in LUE positioning for safety. Some mild tightness noted with end range LUE PROM. Pt was motivated t/o OT session. No cognitive or visual deficits noted with assessment on this date. Pt would benefit from skilled OT to address noted impairments and functional limitations (see below for  any additional details) in order to maximize safety and independence while minimizing falls risk and caregiver burden.  Upon hospital discharge, recommend pt discharge to CIR for high intensity skilled OT services to maximize safety and return to PLOF.    Follow Up Recommendations  CIR    Equipment Recommendations  3 in 1 bedside commode    Recommendations for Other Services       Precautions / Restrictions Precautions Precautions: Fall Restrictions Weight Bearing Restrictions: No      Mobility Bed Mobility Overal bed mobility: Needs Assistance Bed Mobility: Supine to Sit     Supine to sit: Min assist     General bed mobility comments: deferred, up in recliner    Transfers Overall transfer level: Needs assistance Equipment used: 1 person hand held assist Transfers: Sit to/from Stand Sit to Stand: Min assist;Mod assist Stand pivot transfers: Mod assist       General transfer comment: Multiple partial STS transfers from recliner with emphasis on sustaining muscle activation with weightbearing through all LE/UE joints requiring MIN-MOD A    Balance Overall balance assessment: Needs assistance Sitting-balance support: Feet supported Sitting balance-Leahy Scale: Good     Standing balance support: Single extremity supported;During functional activity Standing balance-Leahy Scale: Poor Standing balance comment: patient reliant on single UE support and min assist for safety wtih transfer                           ADL either performed or assessed with clinical judgement   ADL Overall ADL's : Needs assistance/impaired Eating/Feeding: Modified independent Eating/Feeding Details (indicate  cue type and reason): with dom RUE Grooming: Wash/dry face;Sitting;Moderate assistance Grooming Details (indicate cue type and reason): MOD A for LUE support at the elbow with pt supporting L wrist/hand with his RUE to bring washcloth to face incorporating BUE into task                                General ADL Comments: Anticipate MOD-MAX A for LB ADL and UB ADL in seated position given L side impairments     Vision Baseline Vision/History: No visual deficits Patient Visual Report: No change from baseline       Perception     Praxis      Pertinent Vitals/Pain Pain Assessment: Faces Faces Pain Scale: Hurts a little bit Pain Location: with L shoulder end range of motion primarily with shoulder flexion Pain Descriptors / Indicators: Grimacing Pain Intervention(s): Limited activity within patient's tolerance;Monitored during session;Repositioned     Hand Dominance Right   Extremity/Trunk Assessment Upper Extremity Assessment Upper Extremity Assessment: LUE deficits/detail (RUE WFL) LUE Deficits / Details: shoulder flexion 2+/5, shoulder abduction 2/5, elbow flex/ext in GE position 2-/5, composite finger flexion 3+ to 4-/5, sensation grossly intact LUE Coordination: decreased gross motor;decreased fine motor   Lower Extremity Assessment Lower Extremity Assessment: LLE deficits/detail (RLE WFL) LLE Deficits / Details: at least 3/5, difficulty sustaining muscle activation during partial STS, sensation grossly intact LLE Sensation: decreased light touch LLE Coordination: decreased fine motor;decreased gross motor   Cervical / Trunk Assessment Cervical / Trunk Assessment: Normal   Communication Communication Communication: Expressive difficulties;Other (comment) (mumbling at times)   Cognition Arousal/Alertness: Awake/alert Behavior During Therapy: WFL for tasks assessed/performed Overall Cognitive Status: Within Functional Limits for tasks assessed                                     General Comments  BP at start of session 157/82 at rest, 178/82 after session, RN notified    Exercises Other Exercises Other Exercises: Pt instructed in multiple partial STS transfers from recliner with emphasis on sustaining muscle  activation with weightbearing through all LE/UE joints Other Exercises: PROM and AAROM of LUE prior to AROM attempts with cues for visual attention to task, pt instructed in LUE positioning and self ROM ex to perform wiht pt able to return demo   Shoulder Instructions      Home Living Family/patient expects to be discharged to:: Inpatient rehab Living Arrangements: Other relatives (nephew and nephew's wife) Available Help at Discharge: Family;Available PRN/intermittently Type of Home: House Home Access: Stairs to enter [employer] of Steps: a few steps with rail   Home Layout: One level               Home Equipment: None          Prior Functioning/Environment Level of Independence: Independent        Comments: Pt independent, working in [employer], [employer], no falls        OT Problem List: Decreased strength;Decreased coordination;Cardiopulmonary status limiting activity;Pain;Decreased range of motion;Decreased activity tolerance;Impaired balance (sitting and/or standing);Decreased knowledge of use of DME or AE;Impaired UE functional use      OT Treatment/Interventions: Self-care/ADL training;Therapeutic exercise;Therapeutic activities;Neuromuscular education;DME and/or AE instruction;Patient/family education;Balance training    OT Goals(Current goals can be found in the care plan section) Acute Rehab OT Goals Patient Stated Goal: to get better  OT Goal Formulation: With patient Time For Goal Achievement: 09/20/20 Potential to Achieve Goals: Good ADL Goals Pt Will Perform Grooming: standing;with min assist (Min A for LUE supported on counter for stability) Pt Will Perform Upper Body Dressing: sitting;with mod assist (using learned hemi techniques) Pt Will Transfer to Toilet: ambulating;with mod assist;bedside commode (LRAD for amb, protecting LUE) Additional ADL Goal #1: Pt will demo safe LUE positioning during ADL and mobility tasks requiring MIN VC to  incorporate.  OT Frequency: Min 3X/week   Barriers to D/C:            Co-evaluation              AM-PAC OT "6 Clicks" Daily Activity     Outcome Measure Help from another person eating meals?: None Help from another person taking care of personal grooming?: A Little Help from another person toileting, which includes using toliet, bedpan, or urinal?: A Lot Help from another person bathing (including washing, rinsing, drying)?: A Lot Help from another person to put on and taking off regular upper body clothing?: A Lot Help from another person to put on and taking off regular lower body clothing?: A Lot 6 Click Score: 15   End of Session Nurse Communication: Mobility status;Other (comment) (BP)  Activity Tolerance: Patient tolerated treatment well Patient left: in chair;with call bell/phone within reach  OT Visit Diagnosis: Other abnormalities of gait and mobility (R26.89);Hemiplegia and hemiparesis Hemiplegia - Right/Left: Left Hemiplegia - dominant/non-dominant: Non-Dominant Hemiplegia - caused by: Cerebral infarction                Time: 1430-1500 OT Time Calculation (min): 30 min Charges:  OT General Charges $OT Visit: 1 Visit OT Evaluation $OT Eval High Complexity: 1 High OT Treatments $Self Care/Home Management : 8-22 mins $Neuromuscular Re-education: 8-22 mins  Wynona Canes, MPH, MS, OTR/L ascom [phone] 09/06/20, 3:37 PM

## 2020-09-06 NOTE — Progress Notes (Addendum)
Neurology Progress Note  Patient ID: Darryl Sullivan is a 55 y.o. with PMHx of stroke risk factors of obesity and smoking, and limited interface with medical care presenting with likely lacunar stroke, within the tPA window.  Given potential for stuttering lacunar stroke with worsening of symptoms once outside of the tPA window, decision was made to proceed with tPA.  Patient did require initiation of antihypertensive drip for blood pressure control   Subjective: -Feels his left upper extremity make him slightly weaker overnight -Denies headache or any other acute complaints  Exam: Current vital signs: BP (!) 161/83   Pulse 67   Temp 98.4 F (36.9 C) (Oral)   Resp 14   Ht 5\' 7"  (1.702 m)   Wt 108.2 kg   SpO2 100%   BMI 37.35 kg/m  Vital signs in last 24 hours: Temp:  [97.9 F (36.6 C)-98.4 F (36.9 C)] 98.4 F (36.9 C) (03/29 0830) Pulse Rate:  [63-114] 67 (03/29 1000) Resp:  [10-22] 14 (03/29 1000) BP: (129-217)/(74-129) 161/83 (03/29 1000) SpO2:  [87 %-100 %] 100 % (03/29 1000)  Gen: In bed, comfortable  Resp: non-labored breathing, no grossly audible wheezing Cardiac: Perfusing extremities well  Abd: soft, nt  Neuro: MS: Alert, awake, oriented.  No longer has any difficulty with repetition, naming intact.  Follows commands easily. CN: Visual fields intact to confrontation, face symmetric, tongue midline, mild left nasolabial fold flattening baseline per patient, mild dysarthria though he reports this is his baseline speech Motor: Left upper extremity is 2/5 throughout.  Left lower extremity is 4/5 at the hip Sensory: Intact to light touch throughout  Pertinent Data: Cr increase from 1.3 to 1.43   Lab Results  Component Value Date   CHOL 238 (H) 09/06/2020   HDL 38 (L) 09/06/2020   LDLCALC 165 (H) 09/06/2020   TRIG 174 (H) 09/06/2020   CHOLHDL 6.3 09/06/2020   Lab Results  Component Value Date   HGBA1C 4.7 (L) 09/06/2020  However notably his fasting glucose  this morning was 113  Lab Results  Component Value Date   TSH 0.608 09/06/2020    No results found for: RPR  No results found for: HIV1X2   MRI brain personally reviewed, in addition to right corona radiata and left temporoparietal white matter strokes, additional DWI lesions on my review   Head CT personally reviewed.  Small hyperdensity consistent with acute hemorrhage in the left subinsular cortex appears stable from SWI imaging performed 12 hours ago, not seen on initial code stroke head CT   Impression: This is a 55 year old male with a past medical history significant for obesity, smoking, hyperlipidemia (newly diagnosed), impaired fasting blood glucose (though A1c is 4.7%), likely uncontrolled hypertension.  Strokes overall appear to be related to small vessel disease however the right corona radiata lesion is big enough and given the pattern of multiple vascular distributions involved, patient will need event monitor on discharge if echocardiogram does not show an intracardiac thrombus.  While his head CT does show a small hemorrhage, the patient has been overall clinically stable.  The small hemorrhage would be expected to cause right-sided symptoms including the initial mild aphasia on my admission examination (which has since improved).  I suspect his worsening left-sided weakness secondary to completion of his lacunar stroke despite tPA administration.  No role for reversal at this time given clinical and radiographic stability of the hemorrhagic lesion but will need continued close monitoring and strict blood pressure control as below  ICH  Score: 0  Time performed: 10 AM GCS: 13-15 is 0 points Infratentorial: No.. If yes, 1 point -- 0 Volume: <30cc is 0 points  Age: 55 y.o.. >80 is 1 point -- 0  Intraventricular extension is 1 point -- 0 A Score of 0 points has a 30 day mortality of 0%. Stroke. 2001 Apr;32(4):891-7.  Recommendations:  # Multifocal lacunar strokes versus  potentially cardioembolic strokes # Minor hemorrhagic conversion of left sub-insular stroke stroke after tPA administration -Hold on antiplatelet treatment today given small hemorrhagic conversion, consider initiation tomorrow if patient remains stable  -q2hr neurochecks today until 8 PM, then q4hr -SBP goal < 160 (strict), discussed with ICU team -DVT PPx with SCDs for now given hemorrhage -Echocardiogram read pending -Risk factor modification counseling, patient counseled on diet, exercise, smoking cessation, need for outpatient follow-up including sleep study, medication compliance (new diagnosis of hyperlipidemia and impaired fasting blood glucose) -Outpatient sleep study given snoring at night and potential for sleep apnea based on body habitus -PT/OT/SLP evaluation -Neurology will continue to follow  # Hyperlipidemia, new diagnosis -Initiation of atorvastatin 80 mg nightly  # Impaired fasting blood glucose with low-normal A1c -Outpatient PCP follow-up  Darryl Dare MD-PhD [facility] [phone]   CRITICAL CARE Performed by: Gordy Councilman   Total critical care time: 41 minutes  Critical care time was exclusive of separately billable procedures and treating other patients.  Critical care was necessary to treat or prevent imminent or life-threatening deterioration.  Critical care was time spent personally by me on the following activities: development of treatment plan with patient and/or surrogate as well as nursing, discussions with consultants, evaluation of patient's response to treatment, examination of patient, obtaining history from patient or surrogate, ordering and performing treatments and interventions, ordering and review of laboratory studies, ordering and review of radiographic studies, pulse oximetry and re-evaluation of patient's condition.

## 2020-09-06 NOTE — Progress Notes (Signed)
PROGRESS NOTE    Darryl Sullivan  ZOX:[id number] DOB: 02/27/1966 DOA: 09/05/2020 PCP: Patient, No Pcp Per (Inactive)   Brief Narrative: Taken from H&P and prior notes. Darryl Sullivan is a 55 year old male with a past medical history significant for tobacco abuse who presents to [facility] ED on 09/05/2020 due to complaints of left-sided weakness and presyncope.  Found to have acute right basal ganglia and left temporal white matter infarct.  Patient received TPA as he was within the window. Some improvement in speech, passed swallow evaluation, repeat CT scan due to worsening left-sided weakness with a small hemorrhagic conversion. Blood pressure goal less than 150 systolic due to concern of worsening hemorrhage. Neurology is on board.  Subjective: Patient continue to have left-sided weakness, worse in upper extremity than lower extremity.  Able to speak.  Wife and son at bedside.  Assessment & Plan:   Active Problems:   Ischemic stroke (HCC)  Acute CVA s/p TPA with a small hemorrhagic conversion.  Patient with little worsening of left-sided weakness, speech improving.  Repeat CT head with a small hemorrhagic conversion, a small punctate hemorrhage in left putamen.   No chemical DVT prophylaxis or antiplatelet till neurology clears him. Undergoing stroke work-up.  Echocardiogram done-pending results, A1c of 4.7. -Continue with statin -Keep the systolic blood pressure below 811 for concern of hemorrhagic conversion. -Continue with Cleviprex infusion as needed -Continue with as needed labetalol -Might need cardiac monitor on discharge.  Hypertension.  Not on any antihypertensives at home. -Continue with low-dose amlodipine-we will titrate as needed. -Keep the systolic below 150. -Continue with as needed's  AKI.  Most likely some undiagnosed chronic renal disease.  Unknown baseline.  Renal ultrasound consistent with medical renal disease. -Monitor renal function -Avoid  nephrotoxins  Objective: Vitals:   09/06/20 1235 09/06/20 1255 09/06/20 1300 09/06/20 1305  BP: (!) 190/97 (!) 180/88 (!) 182/94 (!) 159/83  Pulse: 80 83 68 72  Resp: 16 15 13 15   Temp:      TempSrc:      SpO2: 96% 97% 96% 97%  Weight:      Height:        Intake/Output Summary (Last 24 hours) at 09/06/2020 1343 Last data filed at 09/06/2020 1300 Gross per 24 hour  Intake 510.19 ml  Output 775 ml  Net -264.81 ml   Filed Weights   09/05/20 0935 09/05/20 1006  Weight: 103 kg 108.2 kg    Examination:  General exam: Appears calm and comfortable  Respiratory system: Clear to auscultation. Respiratory effort normal. Cardiovascular system: S1 & S2 heard, RRR.  Gastrointestinal system: Soft, nontender, nondistended, bowel sounds positive. Central nervous system: Alert and oriented.  Left facial drop with 3/5 strength in left upper and 4/5 strength in left lower extremity, 5/5 on right. Extremities: No edema, no cyanosis, pulses intact and symmetrical. Skin: No rashes, lesions or ulcers Psychiatry: Judgement and insight appear normal. Mood & affect appropriate.    DVT prophylaxis: SCDs Code Status: Full Family Communication: Wife and son were updated at bedside. Disposition Plan:  Status is: Inpatient  Remains inpatient appropriate because:Inpatient level of care appropriate due to severity of illness   Dispo: The patient is from: Home              Anticipated d/c is to: Home              Patient currently is not medically stable to d/c.   Difficult to place patient No  Level of care: ICU  All the records are reviewed and case discussed with Care Management/Social Worker. Management plans discussed with the patient, nursing and they are in agreement.  Consultants:   Neurology  PCCM  Procedures:  Antimicrobials:   Data Reviewed: I have personally reviewed following labs and imaging studies  CBC: Recent Labs  Lab 09/05/20 0937 09/06/20 0511  WBC  7.2 8.1  NEUTROABS 5.2  --   HGB 13.7 13.8  HCT 43.2 43.9  MCV 89.4 88.9  PLT 194 191   Basic Metabolic Panel: Recent Labs  Lab 09/05/20 0937 09/05/20 0955 09/06/20 0511  NA 137  --  139  K 4.2  --  3.5  CL 107  --  106  CO2 23  --  25  GLUCOSE 115*  --  113*  BUN 21*  --  22*  CREATININE 1.31* 1.30* 1.43*  CALCIUM 9.4  --  9.6  MG  --   --  2.0  PHOS  --   --  3.9   GFR: Estimated Creatinine Clearance: 68.4 mL/min (A) (by C-G formula based on SCr of 1.43 mg/dL (H)). Liver Function Tests: Recent Labs  Lab 09/05/20 0937  AST 23  ALT 32  ALKPHOS 58  BILITOT 0.9  PROT 7.5  ALBUMIN 4.4   No results for input(s): LIPASE, AMYLASE in the last 168 hours. No results for input(s): AMMONIA in the last 168 hours. Coagulation Profile: Recent Labs  Lab 09/05/20 0937 09/06/20 0511  INR 1.1 1.2   Cardiac Enzymes: No results for input(s): CKTOTAL, CKMB, CKMBINDEX, TROPONINI in the last 168 hours. BNP (last 3 results) No results for input(s): PROBNP in the last 8760 hours. HbA1C: Recent Labs    09/06/20 0511  HGBA1C 4.7*   CBG: No results for input(s): GLUCAP in the last 168 hours. Lipid Profile: Recent Labs    09/06/20 0511  CHOL 238*  HDL 38*  LDLCALC 165*  TRIG 174*  CHOLHDL 6.3   Thyroid Function Tests: Recent Labs    09/06/20 0511  TSH 0.608   Anemia Panel: No results for input(s): VITAMINB12, FOLATE, FERRITIN, TIBC, IRON, RETICCTPCT in the last 72 hours. Sepsis Labs: No results for input(s): PROCALCITON, LATICACIDVEN in the last 168 hours.  Recent Results (from the past 240 hour(s))  Resp Panel by RT-PCR (Flu A&B, Covid) Nasopharyngeal Swab     Status: None   Collection Time: 09/05/20  9:37 AM   Specimen: Nasopharyngeal Swab; Nasopharyngeal(NP) swabs in vial transport medium  Result Value Ref Range Status   SARS Coronavirus 2 by RT PCR NEGATIVE NEGATIVE Final    Comment: (NOTE) SARS-CoV-2 target nucleic acids are NOT DETECTED.  The  SARS-CoV-2 RNA is generally detectable in upper respiratory specimens during the acute phase of infection. The lowest concentration of SARS-CoV-2 viral copies this assay can detect is 138 copies/mL. A negative result does not preclude SARS-Cov-2 infection and should not be used as the sole basis for treatment or other patient management decisions. A negative result may occur with  improper specimen collection/handling, submission of specimen other than nasopharyngeal swab, presence of viral mutation(s) within the areas targeted by this assay, and inadequate number of viral copies(<138 copies/mL). A negative result must be combined with clinical observations, patient history, and epidemiological information. The expected result is Negative.  Fact Sheet for Patients:  BloggerCourse.comhttps://www.fda.gov/media/152166/download  Fact Sheet for Healthcare Providers:  SeriousBroker.ithttps://www.fda.gov/media/152162/download  This test is no t yet approved or cleared by the [place] FDA and  has been authorized for detection and/or diagnosis of SARS-CoV-2 by FDA under an Emergency Use Authorization (EUA). This EUA will remain  in effect (meaning this test can be used) for the duration of the COVID-19 declaration under Section 564(b)(1) of the Act, 21 U.S.C.section 360bbb-3(b)(1), unless the authorization is terminated  or revoked sooner.       Influenza A by PCR NEGATIVE NEGATIVE Final   Influenza B by PCR NEGATIVE NEGATIVE Final    Comment: (NOTE) The Xpert Xpress SARS-CoV-2/FLU/RSV plus assay is intended as an aid in the diagnosis of influenza from Nasopharyngeal swab specimens and should not be used as a sole basis for treatment. Nasal washings and aspirates are unacceptable for Xpert Xpress SARS-CoV-2/FLU/RSV testing.  Fact Sheet for Patients: BloggerCourse.com  Fact Sheet for Healthcare Providers: SeriousBroker.it  This test is not yet approved or  cleared by the [place] FDA and has been authorized for detection and/or diagnosis of SARS-CoV-2 by FDA under an Emergency Use Authorization (EUA). This EUA will remain in effect (meaning this test can be used) for the duration of the COVID-19 declaration under Section 564(b)(1) of the Act, 21 U.S.C. section 360bbb-3(b)(1), unless the authorization is terminated or revoked.  Performed at [facility], [address] [place], [place] [address]   MRSA PCR Screening     Status: None   Collection Time: 09/05/20  8:00 PM   Specimen: Nasopharyngeal  Result Value Ref Range Status   MRSA by PCR NEGATIVE NEGATIVE Final    Comment:        The GeneXpert MRSA Assay (FDA approved for NASAL specimens only), is one component of a comprehensive MRSA colonization surveillance program. It is not intended to diagnose MRSA infection nor to guide or monitor treatment for MRSA infections. Performed at [facility], [address], [place], [place] [address]      Radiology Studies: CT Angio Head W or Wo Contrast  Result Date: 09/05/2020 CLINICAL DATA:  Syncope. Left-sided weakness. Last known well 7 a.m. EXAM: CT ANGIOGRAPHY HEAD AND NECK TECHNIQUE: Multidetector CT imaging of the head and neck was performed using the standard protocol during bolus administration of intravenous contrast. Multiplanar CT image reconstructions and MIPs were obtained to evaluate the vascular anatomy. Carotid stenosis measurements (when applicable) are obtained utilizing NASCET criteria, using the distal internal carotid diameter as the denominator. CONTRAST:  24mL OMNIPAQUE IOHEXOL 350 MG/ML SOLN COMPARISON:  CT head without contrast 09/05/2020. FINDINGS: CTA NECK FINDINGS Aortic arch: Common origin of the left common carotid artery and innominate artery noted. Single calcification is noted along the undersurface of the aorta. No aneurysm or stenosis present. Right carotid system: The right common  carotid artery is within normal limits. Bifurcation is unremarkable. Cervical right ICA is normal. Left carotid system: The left common carotid artery is within normal limits. Bifurcation is unremarkable. Cervical left ICA is within normal limits. Vertebral arteries: The left vertebral artery is the dominant vessel. Both vertebral arteries originate from the subclavian arteries without significant stenosis. No significant stenosis is present in either vertebral artery in the neck. Skeleton: Vertebral body heights and alignment are normal. Other neck: No focal mucosal or submucosal lesions are present. No significant adenopathy is present. Salivary glands are within normal limits. Upper chest: Mild dependent atelectasis is present. Lungs are otherwise clear. Thoracic inlet is within normal limits. Review of the MIP images confirms the above findings CTA HEAD FINDINGS Anterior circulation: The internal carotid arteries are within normal limits from the high cervical  segments through the ICA termini bilaterally. The A1 and M1 segments are normal. The anterior communicating artery is patent. MCA bifurcations are within normal limits bilaterally. The ACA and MCA branch vessels are normal. Posterior circulation: The left vertebral artery is the dominant vessel. PICA origins are visualized and normal. Vertebrobasilar junction is normal. Basilar artery is within normal limits. Left posterior cerebral artery originates from the basilar tip. The right posterior cerebral artery is of fetal type. A small right P1 segment contributes PCA branch vessels are within normal limits. Venous sinuses: The dural sinuses are patent. The straight sinus and deep cerebral veins are intact. Cortical veins are unremarkable. No vascular malformation is evident. Anatomic variants: None Review of the MIP images confirms the above findings IMPRESSION: 1. Normal variant CTA Circle of Willis without significant proximal stenosis, aneurysm, or branch  vessel occlusion. 2. Normal CTA of the neck. 3. Aortic Atherosclerosis (ICD10-I70.0). Electronically Signed   By: Marin Roberts M.D.   On: 09/05/2020 10:21   CT HEAD WO CONTRAST  Result Date: 09/06/2020 CLINICAL DATA:  Patient status post tPA administration. EXAM: CT HEAD WITHOUT CONTRAST TECHNIQUE: Contiguous axial images were obtained from the base of the skull through the vertex without intravenous contrast. COMPARISON:  CT angiogram of the head and head CT 09/05/2020. FINDINGS: Brain: There is a small hemorrhage in the left putamen measuring 1 cm AP by 0.6 cm transverse by 0.6 cm craniocaudal for a volume of 0.2 mL. No other hemorrhage is identified. There is no hydrocephalus. Chronic microvascular ischemic change noted. Vascular: No hyperdense vessel or unexpected calcification. Skull: Intact.  No focal lesion. Sinuses/Orbits: Negative. Other: None. IMPRESSION: Acute, punctate hemorrhage in the left putamen. No other acute abnormality. Chronic microvascular ischemic change. Critical Value/emergent results were called by telephone at the time of interpretation on 09/06/2020 at 11:54 am to provider DR. KASA, who verbally acknowledged these results. Electronically Signed   By: Drusilla Kanner M.D.   On: 09/06/2020 11:54   CT Angio Neck W and/or Wo Contrast  Result Date: 09/05/2020 CLINICAL DATA:  Syncope. Left-sided weakness. Last known well 7 a.m. EXAM: CT ANGIOGRAPHY HEAD AND NECK TECHNIQUE: Multidetector CT imaging of the head and neck was performed using the standard protocol during bolus administration of intravenous contrast. Multiplanar CT image reconstructions and MIPs were obtained to evaluate the vascular anatomy. Carotid stenosis measurements (when applicable) are obtained utilizing NASCET criteria, using the distal internal carotid diameter as the denominator. CONTRAST:  74mL OMNIPAQUE IOHEXOL 350 MG/ML SOLN COMPARISON:  CT head without contrast 09/05/2020. FINDINGS: CTA NECK FINDINGS  Aortic arch: Common origin of the left common carotid artery and innominate artery noted. Single calcification is noted along the undersurface of the aorta. No aneurysm or stenosis present. Right carotid system: The right common carotid artery is within normal limits. Bifurcation is unremarkable. Cervical right ICA is normal. Left carotid system: The left common carotid artery is within normal limits. Bifurcation is unremarkable. Cervical left ICA is within normal limits. Vertebral arteries: The left vertebral artery is the dominant vessel. Both vertebral arteries originate from the subclavian arteries without significant stenosis. No significant stenosis is present in either vertebral artery in the neck. Skeleton: Vertebral body heights and alignment are normal. Other neck: No focal mucosal or submucosal lesions are present. No significant adenopathy is present. Salivary glands are within normal limits. Upper chest: Mild dependent atelectasis is present. Lungs are otherwise clear. Thoracic inlet is within normal limits. Review of the MIP images confirms the above findings  CTA HEAD FINDINGS Anterior circulation: The internal carotid arteries are within normal limits from the high cervical segments through the ICA termini bilaterally. The A1 and M1 segments are normal. The anterior communicating artery is patent. MCA bifurcations are within normal limits bilaterally. The ACA and MCA branch vessels are normal. Posterior circulation: The left vertebral artery is the dominant vessel. PICA origins are visualized and normal. Vertebrobasilar junction is normal. Basilar artery is within normal limits. Left posterior cerebral artery originates from the basilar tip. The right posterior cerebral artery is of fetal type. A small right P1 segment contributes PCA branch vessels are within normal limits. Venous sinuses: The dural sinuses are patent. The straight sinus and deep cerebral veins are intact. Cortical veins are  unremarkable. No vascular malformation is evident. Anatomic variants: None Review of the MIP images confirms the above findings IMPRESSION: 1. Normal variant CTA Circle of Willis without significant proximal stenosis, aneurysm, or branch vessel occlusion. 2. Normal CTA of the neck. 3. Aortic Atherosclerosis (ICD10-I70.0). Electronically Signed   By: Marin Roberts M.D.   On: 09/05/2020 10:21   MR BRAIN WO CONTRAST  Result Date: 09/06/2020 CLINICAL DATA:  Stroke EXAM: MRI HEAD WITHOUT CONTRAST TECHNIQUE: Multiplanar, multiecho pulse sequences of the brain and surrounding structures were obtained without intravenous contrast. COMPARISON:  None. FINDINGS: Brain: There are areas abnormal diffusion restriction within the right basal ganglia and left temporal white matter. No acute hemorrhage. Remote hemorrhage in the left basal ganglia. Old infarcts of the left thalamus and basal ganglia and within the brainstem. There is multifocal hyperintense T2-weighted signal within the white matter. Parenchymal volume and CSF spaces are normal. The midline structures are normal. Vascular: Major flow voids are preserved. Skull and upper cervical spine: Normal calvarium and skull base. Visualized upper cervical spine and soft tissues are normal. Sinuses/Orbits:No paranasal sinus fluid levels or advanced mucosal thickening. No mastoid or middle ear effusion. Normal orbits. IMPRESSION: 1. Small areas of acute ischemia within the right basal ganglia and left temporal white matter. No hemorrhage or mass effect. 2. Multiple old small vessel infarcts and findings of chronic microvascular ischemia. Electronically Signed   By: Deatra Robinson M.D.   On: 09/06/2020 00:10   US RENAL  Result Date: 09/05/2020 CLINICAL DATA:  Acute kidney injury EXAM: RENAL / URINARY TRACT ULTRASOUND COMPLETE COMPARISON:  None. FINDINGS: Right Kidney: Renal measurements: 11.1 x 4.0 x 5.5 cm = volume: 127 mL. Increased cortical echogenicity. No mass or  hydronephrosis visualized. Left Kidney: Renal measurements: 10.0 x 5.0 x 6.5 cm = volume: 169 mL. Increased cortical echogenicity. No mass or hydronephrosis visualized. Bladder: Appears normal for degree of bladder distention. Other: None. IMPRESSION: 1. Increased cortical echogenicity, in keeping with medical renal disease. 2. No hydronephrosis. Electronically Signed   By: Lauralyn Primes M.D.   On: 09/05/2020 12:05   CT HEAD CODE STROKE WO CONTRAST  Result Date: 09/05/2020 CLINICAL DATA:  Code stroke. Syncope. Left-sided weakness. Last known well at 7 o'clock a.m. EXAM: CT HEAD WITHOUT CONTRAST TECHNIQUE: Contiguous axial images were obtained from the base of the skull through the vertex without intravenous contrast. COMPARISON:  None. FINDINGS: Brain: Remote lacunar infarct is present within the genu of the left internal capsule. Remote ischemia also noted in the left thalamus. A remote lacunar infarct is present in the white matter adjacent to the atrium of the left lateral ventricle. Moderate periventricular white matter hypoattenuation is present in the high parietal lobes bilaterally. Basal ganglia are otherwise intact. Insular  ribbon is normal. No acute or focal cortical abnormality is present. No acute hemorrhage or mass lesion is present. The ventricles are of normal size. No significant extraaxial fluid collection is present. The brainstem and cerebellum are within normal limits. Vascular: No hyperdense vessel or unexpected calcification. Skull: Calvarium is intact. No focal lytic or blastic lesions are present. No significant extracranial soft tissue lesion is present. Sinuses/Orbits: The paranasal sinuses and mastoid air cells are clear. The globes and orbits are within normal limits. ASPECTS [facility] Stroke Program Early CT Score) - Ganglionic level infarction (caudate, lentiform nuclei, internal capsule, insula, M1-M3 cortex): 7/7 - Supraganglionic infarction (M4-M6 cortex): 3/3 Total score (0-10  with 10 being normal): 10/10 IMPRESSION: 1. No acute infarct. 2. Remote white matter infarcts on the left as described. 3. Moderate periventricular white matter hypoattenuation most prominent in the high parietal lobes. This likely reflects the sequela of chronic microvascular ischemia. 4. ASPECTS is 10/10 The above was relayed via text pager to Dr. Iver Nestle on 09/05/2020 at 10:04 . Electronically Signed   By: Marin Roberts M.D.   On: 09/05/2020 10:07    Scheduled Meds: .  stroke: mapping our early stages of recovery book   Does not apply Once  . amLODipine  5 mg Oral Daily  . atorvastatin  80 mg Oral QHS  . Chlorhexidine Gluconate Cloth  6 each Topical Daily  . mouth rinse  15 mL Mouth Rinse BID  . pantoprazole (PROTONIX) IV  40 mg Intravenous QHS   Continuous Infusions: . sodium chloride Stopped (09/05/20 1124)  . clevidipine 2 mg/hr (09/06/20 1300)     LOS: 1 day   Time spent: 45 minutes. More than 50% of the time was spent in counseling/coordination of care  Arnetha Courser, MD [facility]  If 7PM-7AM, please contact night-coverage [email or web address]  09/06/2020, 1:43 PM   This record has been created using Conservation officer, historic buildings. Errors have been sought and corrected,but may not always be located. Such creation errors do not reflect on the standard of care.

## 2020-09-06 NOTE — Progress Notes (Addendum)
Inpatient Rehab Admissions Coordinator Note:   Per PT recommendations, pt was screened for CIR candidacy by Estill Dooms, PT, DPT.  At this time we are recommending a CIR consult.  I will place an order per our protocol and an [facility] will follow up for assessment.  Please contact me with questions.   Estill Dooms, PT, DPT [phone] 09/06/20 2:42 PM

## 2020-09-07 DIAGNOSIS — I612 Nontraumatic intracerebral hemorrhage in hemisphere, unspecified: Secondary | ICD-10-CM

## 2020-09-07 DIAGNOSIS — I6310 Cerebral infarction due to embolism of unspecified precerebral artery: Secondary | ICD-10-CM

## 2020-09-07 LAB — ECHOCARDIOGRAM COMPLETE
AR max vel: 2.17 cm2
AV Peak grad: 8.8 mmHg
Ao pk vel: 1.48 m/s
Area-P 1/2: 3.42 cm2
Height: 67 in
S' Lateral: 3.28 cm
Weight: 3816 oz

## 2020-09-07 LAB — BASIC METABOLIC PANEL
Anion gap: 7 (ref 5–15)
BUN: 24 mg/dL — ABNORMAL HIGH (ref 6–20)
CO2: 25 mmol/L (ref 22–32)
Calcium: 9.4 mg/dL (ref 8.9–10.3)
Chloride: 105 mmol/L (ref 98–111)
Creatinine, Ser: 1.26 mg/dL — ABNORMAL HIGH (ref 0.61–1.24)
GFR, Estimated: >60 mL/min (ref >60)
Glucose, Bld: 126 mg/dL — ABNORMAL HIGH (ref 70–99)
Potassium: 3.5 mmol/L (ref 3.5–5.1)
Sodium: 137 mmol/L (ref 135–145)

## 2020-09-07 LAB — CBC
HCT: 44.3 % (ref 39.0–52.0)
Hemoglobin: 14.2 g/dL (ref 13.0–17.0)
MCH: 28.2 pg (ref 26.0–34.0)
MCHC: 32.1 g/dL (ref 30.0–36.0)
MCV: 87.9 fL (ref 80.0–100.0)
Platelets: 189 10*3/uL (ref 150–400)
RBC: 5.04 MIL/uL (ref 4.22–5.81)
RDW: 11.9 % (ref 11.5–15.5)
WBC: 8.4 10*3/uL (ref 4.0–10.5)
nRBC: 0 % (ref 0.0–0.2)

## 2020-09-07 MED ORDER — AMLODIPINE BESYLATE 5 MG PO TABS
5.0000 mg | ORAL_TABLET | Freq: Once | ORAL | Status: AC
  Administered 2020-09-07: 5 mg via ORAL
  Filled 2020-09-07: qty 1

## 2020-09-07 MED ORDER — AMLODIPINE BESYLATE 10 MG PO TABS
10.0000 mg | ORAL_TABLET | Freq: Every day | ORAL | Status: DC
  Administered 2020-09-08 – 2020-09-14 (×7): 10 mg via ORAL
  Filled 2020-09-07 (×7): qty 1

## 2020-09-07 MED ORDER — HYDROCHLOROTHIAZIDE 12.5 MG PO CAPS
12.5000 mg | ORAL_CAPSULE | Freq: Every day | ORAL | Status: DC
  Administered 2020-09-07 – 2020-09-09 (×3): 12.5 mg via ORAL
  Filled 2020-09-07 (×3): qty 1

## 2020-09-07 NOTE — Progress Notes (Signed)
Neurology Progress Note  Patient ID: Darryl Sullivan is a 55 y.o. with PMHx of stroke risk factors of obesity and smoking, and limited interface with medical care presenting with likely lacunar stroke, within the tPA window.  Given potential for stuttering lacunar stroke with worsening of symptoms once outside of the tPA window, decision was made to proceed with tPA.    Subjective: - No change in LUE>LLE weakness since yesterday - Denies headache or any other acute complaints - Speech slightly improved - Required nicardipine gtt overnight for BP above goal  Interval data: TTE 09/05/20: LVEF 60-65%, grade I diastolic dysfunction, no significant valvular abnl, no visualized intracardiac clot   Exam: Current vital signs: BP (!) 149/88   Pulse 95   Temp 98.6 F (37 C) (Oral)   Resp (!) 25   Ht 5\' 7"  (1.702 m)   Wt 108.2 kg   SpO2 97%   BMI 37.35 kg/m  Vital signs in last 24 hours: Temp:  [97.5 F (36.4 C)-98.6 F (37 C)] 98.6 F (37 C) (03/30 1115) Pulse Rate:  [36-116] 95 (03/30 1245) Resp:  [11-25] 25 (03/30 1245) BP: (121-195)/(70-104) 149/88 (03/30 1245) SpO2:  [83 %-100 %] 97 % (03/30 1245)  Gen: In bed, comfortable  Resp: non-labored breathing, no grossly audible wheezing Cardiac: Perfusing extremities well  Abd: soft, nt  Neuro: MS: Alert, awake, oriented.  No longer has any difficulty with repetition, naming intact.  Follows commands easily. CN: Visual fields intact to confrontation, face symmetric, tongue midline, mild left nasolabial fold flattening baseline per patient, mild dysarthria though he reports this is his baseline speech Motor: Left upper extremity is 2/5 throughout.  Left lower extremity is 4/5 at the hip Sensory: Intact to light touch throughout  Pertinent Data: Cr increase from 1.3 to 1.43   Lab Results  Component Value Date   CHOL 238 (H) 09/06/2020   HDL 38 (L) 09/06/2020   LDLCALC 165 (H) 09/06/2020   TRIG 174 (H) 09/06/2020   CHOLHDL 6.3  09/06/2020   Lab Results  Component Value Date   HGBA1C 4.7 (L) 09/06/2020  However notably his fasting glucose this morning was 113  Lab Results  Component Value Date   TSH 0.608 09/06/2020    No results found for: RPR  No results found for: HIV1X2   MRI brain personally reviewed, in addition to right corona radiata and left temporoparietal white matter strokes, additional DWI lesions on my review    Small hyperdensity consistent with acute hemorrhage in the left subinsular cortex appears stable from SWI imaging performed 12 hours ago, not seen on initial code stroke head CT   CNS imaging personally reviewed by Dr. 09/08/2020  Impression: This is a 56 year old male with a past medical history significant for obesity, smoking, hyperlipidemia (newly diagnosed), impaired fasting blood glucose (though A1c is 4.7%), likely uncontrolled hypertension.  Strokes overall appear to be related to small vessel disease however the right corona radiata lesion is big enough and given the pattern of multiple vascular distributions involved, patient will need event monitor on discharge since echo did not show intracardiac thrombus.  CT showed small hemorrhagic conversion but patient has been clinically stable.  The small hemorrhage would be expected to cause right-sided symptoms including the initial mild aphasia on my admission examination (which has since improved). No role for reversal at this time given clinical and radiographic stability of the hemorrhagic lesion but will need continued close monitoring and strict blood pressure control as below  ICH score 09/06/20 was 0  Recommendations:  # Multifocal lacunar strokes versus potentially cardioembolic strokes # Minor hemorrhagic conversion of left sub-insular stroke stroke after tPA administration - Consider antiplatelet initiation tomorrow if stable, will not start today given persistent HTN requiring nicardipine gtt in setting of recent small  hemorrhagic transformation - q4 hr neurochecks -SBP goal < 160 (strict), discussed with ICU team -DVT PPx with SCDs for now given hemorrhage -Risk factor modification counseling, patient counseled on diet, exercise, smoking cessation, need for outpatient follow-up including sleep study, medication compliance (new diagnosis of hyperlipidemia and impaired fasting blood glucose) -Outpatient sleep study given snoring at night and potential for sleep apnea based on body habitus -PT/OT/SLP evaluation -Neurology will continue to follow - Amb cardiac monitoring after discharge  # Hyperlipidemia, new diagnosis -Initiation of atorvastatin 80 mg nightly  # Impaired fasting blood glucose with low-normal A1c -Outpatient PCP follow-up  Bing Neighbors, MD [facility] [phone]  If 7pm- 7am, please page neurology on call as listed in AMION.

## 2020-09-07 NOTE — Progress Notes (Addendum)
Inpatient Rehab Admissions:  Inpatient Rehab Consult received.  I spoke to the patient over the phone for rehabilitation assessment and to discuss goals and expectations of an inpatient rehab admission.  We discussed goals of supervision to mod I with estimated length of stay to be about 2 weeks.  Pt states that his nephew's spouse could likely take time off of work to stay with him during the day if he needed 24/7 supervision.  Left a voicemail for pt's nephew to confirm.  Will need insurance authorization prior to pursuing CIR, and will open once pt off cardiac drips.  Will continue to follow.   Update: Spoke to pt's nephew, Denyse Amass.  He states that without patient being able to work, he and his wife have to both work and pt would not have 24/7 supervision at home.  I did not appreciate any significant cognitive deficits when I spoke to pt on the phone, and note that SLP screened pt out (no formal assessment, but signed off based on chart review and discussion with care team and pt).  Would need to be cautious about proceeding with CIR as I do believe he would benefit, but his BCBS would not authorize SNF after CIR and he does not have 24/7 support.  If he continues to progress with his mobility, we could potentially consider him for CIR with intermittent supervision goals.   Signed: Estill Dooms, PT, DPT Admissions Coordinator [phone] 09/07/20  2:23 PM

## 2020-09-07 NOTE — Progress Notes (Signed)
Physical Therapy Treatment Patient Details Name: Darryl Sullivan MRN: [id number] DOB: [birth year] Today's Date: 09/07/2020    History of Present Illness Solan Vosler is a 55 year old male with a past medical history significant for tobacco abuse who presents to [facility] ED on 09/05/2020 due to complaints of left-sided weakness and presyncope.      He reports that at work this morning he developed acute onset of left arm and leg weakness at approximately 07:00 a.m.  He denies facial asymmetry, slurred speech, headache, chest pain, palpitations, shortness of breath, cough, neck pain, back pain, dysuria.  Denies any history of stroke, high blood pressure, diabetes mellitus, alcohol or illicit drug abuse.  He is now on any blood thinners. CT shows small areas of ischemia in R basal ganglia and L temporal white matter.    PT Comments    Patient received in bed, alert, watching TV. Agrees to PT session. Patient more alert, talkative this session. He performed seated L LE exercises, requires min assist for supine to sit. Mod assist for sit to stand with L knee blocked to prevent buckling. Patient is able to take a few pivoting steps to recliner. He will continue to benefit from skilled PT while here to improve functonal mobility, strength and independence.      Follow Up Recommendations  CIR;Supervision for mobility/OOB     Equipment Recommendations  None recommended by PT    Recommendations for Other Services Rehab consult     Precautions / Restrictions Precautions Precautions: Fall Restrictions Weight Bearing Restrictions: No    Mobility  Bed Mobility Overal bed mobility: Needs Assistance Bed Mobility: Supine to Sit     Supine to sit: Min assist     General bed mobility comments: patient requires min assist to get turned into sitting and min assist to raise trunk to seated position.    Transfers Overall transfer level: Needs assistance Equipment used: 1 person hand held  assist Transfers: Sit to/from [employer] Sit to Stand: Min assist Stand pivot transfers: Mod assist       General transfer comment: Patient requires L knee blocked to prevent buckling during standing and especially with movement of R LE to pivot to recliner.  Ambulation/Gait             General Gait Details: unable   Stairs             Wheelchair Mobility    Modified Rankin (Stroke Patients Only)       Balance Overall balance assessment: Needs assistance Sitting-balance support: Feet supported Sitting balance-Leahy Scale: Good     Standing balance support: Single extremity supported;During functional activity Standing balance-Leahy Scale: Fair Standing balance comment: patient reliant on single UE support and min assist for safety wtih transfer                            Cognition Arousal/Alertness: Awake/alert Behavior During Therapy: WFL for tasks assessed/performed Overall Cognitive Status: Within Functional Limits for tasks assessed                                        Exercises Other Exercises Other Exercises: Seated LAQ, marching x 10 reps on left. PROM for L heel cord stretch    General Comments        Pertinent Vitals/Pain Pain Assessment: No/denies pain    Home  Living                      Prior Function            PT Goals (current goals can now be found in the care plan section) Acute Rehab PT Goals Patient Stated Goal: to get better PT Goal Formulation: With patient Time For Goal Achievement: 09/20/20 Potential to Achieve Goals: Good Progress towards PT goals: Progressing toward goals    Frequency    7X/week      PT Plan Current plan remains appropriate    Co-evaluation              AM-PAC PT "6 Clicks" Mobility   Outcome Measure  Help needed turning from your back to your side while in a flat bed without using bedrails?: A Little Help needed moving from  lying on your back to sitting on the side of a flat bed without using bedrails?: A Lot Help needed moving to and from a bed to a chair (including a wheelchair)?: A Lot Help needed standing up from a chair using your arms (e.g., wheelchair or bedside chair)?: A Little Help needed to walk in hospital room?: Total Help needed climbing 3-5 steps with a railing? : Total 6 Click Score: 12    End of Session Equipment Utilized During Treatment: Gait belt Activity Tolerance: Patient tolerated treatment well Patient left: in chair;with call bell/phone within reach;with SCD's reapplied Nurse Communication: Mobility status PT Visit Diagnosis: Unsteadiness on feet (R26.81);Other abnormalities of gait and mobility (R26.89);Muscle weakness (generalized) (M62.81);Difficulty in walking, not elsewhere classified (R26.2);Hemiplegia and hemiparesis Hemiplegia - Right/Left: Left Hemiplegia - dominant/non-dominant: Non-dominant Hemiplegia - caused by: Cerebral infarction     Time: 5366-4403 PT Time Calculation (min) (ACUTE ONLY): 23 min  Charges:  $Therapeutic Exercise: 8-22 mins $Therapeutic Activity: 8-22 mins                     Jemell Town, PT, GCS 09/07/20,1:15 PM

## 2020-09-07 NOTE — Progress Notes (Signed)
PROGRESS NOTE    [name]  WNI:[id number] DOB: [birth year] DOA: 09/05/2020 PCP: Patient, No Pcp Per (Inactive)   Brief Narrative: Taken from H&P and prior notes. Darryl Sullivan is a 55 year old male with a past medical history significant for tobacco abuse who presents to [facility] ED on 09/05/2020 due to complaints of left-sided weakness and presyncope.  Found to have acute right basal ganglia and left temporal white matter infarct.  Patient received TPA as he was within the window. Some improvement in speech, passed swallow evaluation, repeat CT scan due to worsening left-sided weakness with a small hemorrhagic conversion. Blood pressure goal less than 150 systolic due to concern of worsening hemorrhage. Neurology is on board.  Subjective: Patient continue to have left-sided weakness, worse in upper extremity than lower extremity.  Able to speak.  Wife and son at bedside.  Assessment & Plan:   Active Problems:   Cerebrovascular accident (CVA) (HCC)   AKI (acute kidney injury) (HCC)   Hypertensive emergency  Acute CVA s/p TPA with a small hemorrhagic conversion.  Patient with little worsening of left-sided weakness, speech improving.  Repeat CT head with a small hemorrhagic conversion, a small punctate hemorrhage in left putamen.   No chemical DVT prophylaxis or antiplatelet till neurology clears him.  Potentially tomorrow.  Echocardiogram done-within normal limit, A1c of 4.7. -Continue with high intensity statin -Keep the systolic blood pressure below 381 for concern of hemorrhagic conversion. -Continue with nicardipine infusion as needed -Neuro checks every 4 hours for now -PT/OT/speech recommends CIR -Outpatient sleep study -Consider cardiac monitoring at discharge  Hypertension.  Not on any antihypertensives at home. -Increased dose of amlodipine to 10 mg once daily -Keep the systolic below 160. -Add HCTZ 82.9 mg once daily As needed labetalol and nicardipine infusion for  strict blood pressure control  AKI.  Most likely some undiagnosed chronic renal disease.  Unknown baseline.  Renal ultrasound consistent with medical renal disease. -Monitor renal function -Avoid nephrotoxins  Objective: Vitals:   09/07/20 2145 09/07/20 2200 09/07/20 2215 09/07/20 2230  BP: 133/65 137/64 134/65   Pulse: 85 97 91 96  Resp: 16 17 18 20   Temp:      TempSrc:      SpO2: 91% 96% 96% 95%  Weight:      Height:        Intake/Output Summary (Last 24 hours) at 09/07/2020 2248 Last data filed at 09/07/2020 2200 Gross per 24 hour  Intake 2091.36 ml  Output 1640 ml  Net 451.36 ml   Filed Weights   09/05/20 0935 09/05/20 1006  Weight: 103 kg 108.2 kg    Examination:  General exam: Appears calm and comfortable  Respiratory system: Clear to auscultation. Respiratory effort normal. Cardiovascular system: S1 & S2 heard, RRR.  Gastrointestinal system: Soft, nontender, nondistended, bowel sounds positive. Central nervous system: Alert and oriented.  Left facial drop with strength in left upper 2/5 and 4/5 strength in left lower extremity, 5/5 on right. Extremities: No edema, no cyanosis, pulses intact and symmetrical. Skin: No rashes, lesions or ulcers Psychiatry: Judgement and insight appear normal. Mood & affect appropriate.    DVT prophylaxis: SCDs Code Status: Full Family Communication: Sister was updated at bedside. Disposition Plan:  Status is: Inpatient  Remains inpatient appropriate because:Inpatient level of care appropriate due to severity of illness   Dispo: The patient is from: Home              Anticipated d/c is to: CIR  Patient currently is not medically stable to d/c.   Difficult to place patient No              Level of care: ICU  All the records are reviewed and case discussed with Care Management/Social Worker. Management plans discussed with the patient, nursing and they are in agreement.  Consultants:    Neurology  PCCM  Procedures:  Antimicrobials:   Data Reviewed: I have personally reviewed following labs and imaging studies  CBC: Recent Labs  Lab 09/05/20 0937 09/06/20 0511 09/07/20 0543  WBC 7.2 8.1 8.4  NEUTROABS 5.2  --   --   HGB 13.7 13.8 14.2  HCT 43.2 43.9 44.3  MCV 89.4 88.9 87.9  PLT 194 191 189   Basic Metabolic Panel: Recent Labs  Lab 09/05/20 0937 09/05/20 0955 09/06/20 0511 09/07/20 0543  NA 137  --  139 137  K 4.2  --  3.5 3.5  CL 107  --  106 105  CO2 23  --  25 25  GLUCOSE 115*  --  113* 126*  BUN 21*  --  22* 24*  CREATININE 1.31* 1.30* 1.43* 1.26*  CALCIUM 9.4  --  9.6 9.4  MG  --   --  2.0  --   PHOS  --   --  3.9  --    GFR: Estimated Creatinine Clearance: 77.7 mL/min (A) (by C-G formula based on SCr of 1.26 mg/dL (H)). Liver Function Tests: Recent Labs  Lab 09/05/20 0937  AST 23  ALT 32  ALKPHOS 58  BILITOT 0.9  PROT 7.5  ALBUMIN 4.4   No results for input(s): LIPASE, AMYLASE in the last 168 hours. No results for input(s): AMMONIA in the last 168 hours. Coagulation Profile: Recent Labs  Lab 09/05/20 0937 09/06/20 0511  INR 1.1 1.2   Cardiac Enzymes: No results for input(s): CKTOTAL, CKMB, CKMBINDEX, TROPONINI in the last 168 hours. BNP (last 3 results) No results for input(s): PROBNP in the last 8760 hours. HbA1C: Recent Labs    09/06/20 0511  HGBA1C 4.7*   CBG: No results for input(s): GLUCAP in the last 168 hours. Lipid Profile: Recent Labs    09/06/20 0511  CHOL 238*  HDL 38*  LDLCALC 165*  TRIG 174*  CHOLHDL 6.3   Thyroid Function Tests: Recent Labs    09/06/20 0511  TSH 0.608   Anemia Panel: No results for input(s): VITAMINB12, FOLATE, FERRITIN, TIBC, IRON, RETICCTPCT in the last 72 hours. Sepsis Labs: No results for input(s): PROCALCITON, LATICACIDVEN in the last 168 hours.  Recent Results (from the past 240 hour(s))  Resp Panel by RT-PCR (Flu A&B, Covid) Nasopharyngeal Swab     Status:  None   Collection Time: 09/05/20  9:37 AM   Specimen: Nasopharyngeal Swab; Nasopharyngeal(NP) swabs in vial transport medium  Result Value Ref Range Status   SARS Coronavirus 2 by RT PCR NEGATIVE NEGATIVE Final    Comment: (NOTE) SARS-CoV-2 target nucleic acids are NOT DETECTED.  The SARS-CoV-2 RNA is generally detectable in upper respiratory specimens during the acute phase of infection. The lowest concentration of SARS-CoV-2 viral copies this assay can detect is 138 copies/mL. A negative result does not preclude SARS-Cov-2 infection and should not be used as the sole basis for treatment or other patient management decisions. A negative result may occur with  improper specimen collection/handling, submission of specimen other than nasopharyngeal swab, presence of viral mutation(s) within the areas targeted by this assay, and inadequate number of  viral copies(<138 copies/mL). A negative result must be combined with clinical observations, patient history, and epidemiological information. The expected result is Negative.  Fact Sheet for Patients:  BloggerCourse.comhttps://www.fda.gov/media/152166/download  Fact Sheet for Healthcare Providers:  SeriousBroker.ithttps://www.fda.gov/media/152162/download  This test is no t yet approved or cleared by the [place] FDA and  has been authorized for detection and/or diagnosis of SARS-CoV-2 by FDA under an Emergency Use Authorization (EUA). This EUA will remain  in effect (meaning this test can be used) for the duration of the COVID-19 declaration under Section 564(b)(1) of the Act, 21 U.S.C.section 360bbb-3(b)(1), unless the authorization is terminated  or revoked sooner.       Influenza A by PCR NEGATIVE NEGATIVE Final   Influenza B by PCR NEGATIVE NEGATIVE Final    Comment: (NOTE) The Xpert Xpress SARS-CoV-2/FLU/RSV plus assay is intended as an aid in the diagnosis of influenza from Nasopharyngeal swab specimens and should not be used as a sole basis for  treatment. Nasal washings and aspirates are unacceptable for Xpert Xpress SARS-CoV-2/FLU/RSV testing.  Fact Sheet for Patients: BloggerCourse.comhttps://www.fda.gov/media/152166/download  Fact Sheet for Healthcare Providers: SeriousBroker.ithttps://www.fda.gov/media/152162/download  This test is not yet approved or cleared by the [place] FDA and has been authorized for detection and/or diagnosis of SARS-CoV-2 by FDA under an Emergency Use Authorization (EUA). This EUA will remain in effect (meaning this test can be used) for the duration of the COVID-19 declaration under Section 564(b)(1) of the Act, 21 U.S.C. section 360bbb-3(b)(1), unless the authorization is terminated or revoked.  Performed at [facility], [address] [place], [place] [address]   MRSA PCR Screening     Status: None   Collection Time: 09/05/20  8:00 PM   Specimen: Nasopharyngeal  Result Value Ref Range Status   MRSA by PCR NEGATIVE NEGATIVE Final    Comment:        The GeneXpert MRSA Assay (FDA approved for NASAL specimens only), is one component of a comprehensive MRSA colonization surveillance program. It is not intended to diagnose MRSA infection nor to guide or monitor treatment for MRSA infections. Performed at [facility], [address] [place], [place] [address]      Radiology Studies: CT HEAD WO CONTRAST  Result Date: 09/06/2020 CLINICAL DATA:  Patient status post tPA administration. EXAM: CT HEAD WITHOUT CONTRAST TECHNIQUE: Contiguous axial images were obtained from the base of the skull through the vertex without intravenous contrast. COMPARISON:  CT angiogram of the head and head CT 09/05/2020. FINDINGS: Brain: There is a small hemorrhage in the left putamen measuring 1 cm AP by 0.6 cm transverse by 0.6 cm craniocaudal for a volume of 0.2 mL. No other hemorrhage is identified. There is no hydrocephalus. Chronic microvascular ischemic change noted. Vascular: No hyperdense vessel or unexpected  calcification. Skull: Intact.  No focal lesion. Sinuses/Orbits: Negative. Other: None. IMPRESSION: Acute, punctate hemorrhage in the left putamen. No other acute abnormality. Chronic microvascular ischemic change. Critical Value/emergent results were called by telephone at the time of interpretation on 09/06/2020 at 11:54 am to provider DR. KASA, who verbally acknowledged these results. Electronically Signed   By: Drusilla Kannerhomas  Dalessio M.D.   On: 09/06/2020 11:54   MR BRAIN WO CONTRAST  Result Date: 09/06/2020 CLINICAL DATA:  Stroke EXAM: MRI HEAD WITHOUT CONTRAST TECHNIQUE: Multiplanar, multiecho pulse sequences of the brain and surrounding structures were obtained without intravenous contrast. COMPARISON:  None. FINDINGS: Brain: There are areas abnormal diffusion restriction within the right basal ganglia and left temporal white matter.  No acute hemorrhage. Remote hemorrhage in the left basal ganglia. Old infarcts of the left thalamus and basal ganglia and within the brainstem. There is multifocal hyperintense T2-weighted signal within the white matter. Parenchymal volume and CSF spaces are normal. The midline structures are normal. Vascular: Major flow voids are preserved. Skull and upper cervical spine: Normal calvarium and skull base. Visualized upper cervical spine and soft tissues are normal. Sinuses/Orbits:No paranasal sinus fluid levels or advanced mucosal thickening. No mastoid or middle ear effusion. Normal orbits. IMPRESSION: 1. Small areas of acute ischemia within the right basal ganglia and left temporal white matter. No hemorrhage or mass effect. 2. Multiple old small vessel infarcts and findings of chronic microvascular ischemia. Electronically Signed   By: Deatra Robinson M.D.   On: 09/06/2020 00:10    Scheduled Meds: .  stroke: mapping our early stages of recovery book   Does not apply Once  . [START ON 09/08/2020] amLODipine  10 mg Oral Daily  . atorvastatin  80 mg Oral QHS  . Chlorhexidine  Gluconate Cloth  6 each Topical Daily  . hydrochlorothiazide  12.5 mg Oral Daily  . mouth rinse  15 mL Mouth Rinse BID  . pantoprazole (PROTONIX) IV  40 mg Intravenous QHS   Continuous Infusions: . niCARDipine Stopped (09/07/20 1832)     LOS: 2 days   Time spent: 45 minutes. More than 50% of the time was spent in counseling/coordination of care  Delfino Lovett, MD [facility]  If 7PM-7AM, please contact night-coverage [email or web address]  09/07/2020, 10:48 PM   This record has been created using Conservation officer, historic buildings. Errors have been sought and corrected,but may not always be located. Such creation errors do not reflect on the standard of care.

## 2020-09-07 NOTE — Progress Notes (Signed)
Occupational Therapy Treatment Patient Details Name: Darryl Sullivan MRN: [id number] DOB: Apr 10, 1966 Today's Date: 09/07/2020    History of present illness Darryl Sullivan is a 55 year old male with a past medical history significant for tobacco abuse who presents to [facility] ED on 09/05/2020 due to complaints of left-sided weakness and presyncope.      He reports that at work this morning he developed acute onset of left arm and leg weakness at approximately 07:00 a.m.  He denies facial asymmetry, slurred speech, headache, chest pain, palpitations, shortness of breath, cough, neck pain, back pain, dysuria.  Denies any history of stroke, high blood pressure, diabetes mellitus, alcohol or illicit drug abuse.  He is now on any blood thinners. CT shows small areas of ischemia in R basal ganglia and L temporal white matter.   OT comments  Pt seen for OT treatment this date to f/u re: [facility] skills and PROM to improve elasticity as well as neuro-plasticity of L UE through functional movements. OT engages pt and family in education re: importance of trialing fine motor coordination movements even if unsuccessful to help promote plasticity. OT engages pt in digit flx/ext, thumb opposition, digit abd/add, and wrist pronation/supination of L side; both self-assisted from functional R UE (or PROM) for x10 reps per digit) as well as attempts without assist from his other hand to promote re-contructing motor-neuro pathways. Pt with some frustration, but with education he is engaged and demos good understanding. Pt is able to demo very slight independent digit flexion (mostly appears to be successful along the medial nerve pathway with thumb and second/third digit most responsive on observation). Will continue to follow acutely. Anticipate pt will require continued skilled therapy in both the acute hospital setting as well as f/u in inpatient rehab to reach highest attainable level of functional performance.    Follow Up  Recommendations  CIR    Equipment Recommendations  3 in 1 bedside commode    Recommendations for Other Services      Precautions / Restrictions Precautions Precautions: Fall Restrictions Weight Bearing Restrictions: No       Mobility Bed Mobility               General bed mobility comments: deferred, pt up to chair pre/post session. Pt does come to long sitting with MOD I in chair as chair was laid back in flat position when OT enters room. OT adjusts and repositions.    Transfers                 General transfer comment: deferred    Balance Overall balance assessment: Needs assistance Sitting-balance support: Feet supported Sitting balance-Leahy Scale: Good                                     ADL either performed or assessed with clinical judgement   ADL                                               Vision Patient Visual Report: No change from baseline     Perception     Praxis      Cognition Arousal/Alertness: Awake/alert Behavior During Therapy: WFL for tasks assessed/performed Overall Cognitive Status: Within Functional Limits for tasks assessed  Exercises Other Exercises Other Exercises: OT engages pt and family in education re: importance of trialing fine motor coordination movements even if unsuccessful to help promote neuroplasticity. OT engages pt in digit flx/ext, thumb opposition, digit abd/add, and wrist pronation/supination of L side wtih both self-assist from functional R UE (PROM x10 reps per digit) as well as attempts without assist from his other hand to promote re-contructing motor-neuro pathways. Pt with some frustration, but with education he is engaged and demos good understanding.   Shoulder Instructions       General Comments      Pertinent Vitals/ Pain       Pain Assessment: No/denies pain  Home Living                                           Prior Functioning/Environment              Frequency  Min 3X/week        Progress Toward Goals  OT Goals(current goals can now be found in the care plan section)  Progress towards OT goals: Progressing toward goals  Acute Rehab OT Goals Patient Stated Goal: to get better OT Goal Formulation: With patient Time For Goal Achievement: 09/20/20 Potential to Achieve Goals: Good  Plan Discharge plan remains appropriate    Co-evaluation                 AM-PAC OT "6 Clicks" Daily Activity     Outcome Measure   Help from another person eating meals?: None Help from another person taking care of personal grooming?: A Little Help from another person toileting, which includes using toliet, bedpan, or urinal?: A Lot Help from another person bathing (including washing, rinsing, drying)?: A Lot Help from another person to put on and taking off regular upper body clothing?: A Lot Help from another person to put on and taking off regular lower body clothing?: A Lot 6 Click Score: 15    End of Session    OT Visit Diagnosis: Other abnormalities of gait and mobility (R26.89);Hemiplegia and hemiparesis Hemiplegia - Right/Left: Left Hemiplegia - dominant/non-dominant: Non-Dominant Hemiplegia - caused by: Cerebral infarction   Activity Tolerance Patient tolerated treatment well   Patient Left in chair;with call bell/phone within reach;with family/visitor present   Nurse Communication Mobility status        Time: 5643-3295 OT Time Calculation (min): 25 min  Charges: OT General Charges $OT Visit: 1 Visit OT Treatments $Therapeutic Activity: 23-37 mins  Rejeana Brock, MS, OTR/L ascom [phone] 09/07/20, 5:43 PM

## 2020-09-07 NOTE — Progress Notes (Signed)
Pt transitioned over to Nicardipine gtt. Gtt @ 5 for majority of shift, titrated up to 7.5 this AM @ 0600 after labs drawn. Pt resting well overnight. Q4 neuro checks complete. No acute neurological changes overnight. Wife updated via phone. Foley in place, put out 700 ml urine. No complaints of pain overnight. Pt in no acute distress @ this time. Will continue to monitor.

## 2020-09-08 ENCOUNTER — Inpatient Hospital Stay: Payer: BC Managed Care – PPO

## 2020-09-08 DIAGNOSIS — I610 Nontraumatic intracerebral hemorrhage in hemisphere, subcortical: Secondary | ICD-10-CM

## 2020-09-08 LAB — BASIC METABOLIC PANEL
Anion gap: 9 (ref 5–15)
BUN: 28 mg/dL — ABNORMAL HIGH (ref 6–20)
CO2: 24 mmol/L (ref 22–32)
Calcium: 9.4 mg/dL (ref 8.9–10.3)
Chloride: 106 mmol/L (ref 98–111)
Creatinine, Ser: 1.37 mg/dL — ABNORMAL HIGH (ref 0.61–1.24)
GFR, Estimated: >60 mL/min (ref >60)
Glucose, Bld: 119 mg/dL — ABNORMAL HIGH (ref 70–99)
Potassium: 3.7 mmol/L (ref 3.5–5.1)
Sodium: 139 mmol/L (ref 135–145)

## 2020-09-08 LAB — CBC
HCT: 43.1 % (ref 39.0–52.0)
Hemoglobin: 13.7 g/dL (ref 13.0–17.0)
MCH: 28.5 pg (ref 26.0–34.0)
MCHC: 31.8 g/dL (ref 30.0–36.0)
MCV: 89.6 fL (ref 80.0–100.0)
Platelets: 179 10*3/uL (ref 150–400)
RBC: 4.81 MIL/uL (ref 4.22–5.81)
RDW: 11.9 % (ref 11.5–15.5)
WBC: 6.7 10*3/uL (ref 4.0–10.5)
nRBC: 0 % (ref 0.0–0.2)

## 2020-09-08 MED ORDER — ASPIRIN EC 81 MG PO TBEC
81.0000 mg | DELAYED_RELEASE_TABLET | Freq: Every day | ORAL | Status: DC
  Administered 2020-09-08 – 2020-09-14 (×7): 81 mg via ORAL
  Filled 2020-09-08 (×7): qty 1

## 2020-09-08 MED ORDER — PANTOPRAZOLE SODIUM 40 MG PO TBEC
40.0000 mg | DELAYED_RELEASE_TABLET | Freq: Every day | ORAL | Status: DC
  Administered 2020-09-08 – 2020-09-13 (×6): 40 mg via ORAL
  Filled 2020-09-08 (×6): qty 1

## 2020-09-08 MED ORDER — ENOXAPARIN SODIUM 40 MG/0.4ML ~~LOC~~ SOLN
40.0000 mg | SUBCUTANEOUS | Status: DC
  Administered 2020-09-08 – 2020-09-13 (×6): 40 mg via SUBCUTANEOUS
  Filled 2020-09-08 (×6): qty 0.4

## 2020-09-08 NOTE — Progress Notes (Signed)
   09/08/20 1720  Clinical Encounter Type  Visited With Patient  Visit Type Initial  Referral From Nurse  Consult/Referral To Chaplain  Spiritual Encounters  Spiritual Needs Brochure  Chaplain Eunice Winecoff responded to a page in ICU-18, Pt Darryl Sullivan. I did one AD education and left one pamphlet. Darryl Sullivan stated, he will get someone to help him complete it tomorrow. I advised Darryl Sullivan to have the nurse page the Chaplains after he completes it.

## 2020-09-08 NOTE — Progress Notes (Signed)
Progress Note    Darryl Sullivan  ENI:[id number] DOB: Jan 10, 1966  DOA: 09/05/2020 PCP: Patient, No Pcp Per (Inactive)      Brief Narrative:    Medical records reviewed and are as summarized below:  Kenyen L Sullivan is a 55 y.o. male with medical history significant for obesity, tobacco use disorder, hypertension, who was brought to the hospital because of left-sided weakness and presyncope.Darryl Sullivan  He was found to have acute right basal ganglia and left temporal white matter infarct/stroke.  He had mild aphasia on admission.  He was given TPA      Assessment/Plan:   Principal Problem:   Cerebrovascular accident (CVA) [facility]) Active Problems:   Hypertensive emergency    Body mass index is 37.35 kg/m.  (Obesity)   Acute stroke s/p TPA with a small hemorrhagic conversion: Repeat CT head today shows stability of punctate left basal ganglia hemorrhage.  Neurologist recommended initiating aspirin therapy.  Continue Lipitor.  PT and OT evaluation.  Discharge to CIR recommended.  Hemoglobin A1c is 4.7. Ambulatory cardiac monitor recommended at discharge.  Hyperlipidemia: Continue Lipitor  Hypertension: Strict BP control to keep keep systolic blood pressure less than 160.  Patient has required IV nicardipine infusion to control BP.  Continue amlodipine and HCTZ.  Suspected CKD stage II to stage IIIa: Monitor creatinine closely.  Obesity: Outpatient sleep study recommended for evaluation of OSA.   Diet Order            Diet Heart Room service appropriate? Yes; Fluid consistency: Thin  Diet effective now                    Consultants:  Neurologist  Procedures:  None    Medications:   .  stroke: mapping our early stages of recovery book   Does not apply Once  . amLODipine  10 mg Oral Daily  . aspirin EC  81 mg Oral Daily  . atorvastatin  80 mg Oral QHS  . Chlorhexidine Gluconate Cloth  6 each Topical Daily  . enoxaparin (LOVENOX) injection  40 mg Subcutaneous  Q24H  . hydrochlorothiazide  12.5 mg Oral Daily  . mouth rinse  15 mL Mouth Rinse BID  . pantoprazole  40 mg Oral QHS   Continuous Infusions: . niCARDipine 2.5 mg/hr (09/08/20 1200)     Anti-infectives (From admission, onward)   None             Family Communication/Anticipated D/C date and plan/Code Status   DVT prophylaxis: enoxaparin (LOVENOX) injection 40 mg Start: 09/08/20 2200 Place and maintain sequential compression device Start: 09/06/20 1128 SCD's Start: 09/05/20 1057     Code Status: Full Code  Family Communication: None Disposition Plan:    Status is: Inpatient  Remains inpatient appropriate because:Unsafe d/c plan and Inpatient level of care appropriate due to severity of illness   Dispo: The patient is from: Home              Anticipated d/c is to: Home              Patient currently is not medically stable to d/c.   Difficult to place patient No           Subjective:   Interval events noted.  He has no complaints.  Objective:    Vitals:   09/08/20 1230 09/08/20 1245 09/08/20 1300 09/08/20 1315  BP: (!) 194/111 (!) 184/102 (!) 147/87 (!) 165/87  Pulse: (!) 116 (!) 105 91 (!)  112  Resp: 15 14 13 16   Temp:      TempSrc:      SpO2: 95% 95% 96% 96%  Weight:      Height:       No data found.   Intake/Output Summary (Last 24 hours) at 09/08/2020 1400 Last data filed at 09/08/2020 1200 Gross per 24 hour  Intake 1398.93 ml  Output 2300 ml  Net -901.07 ml   Filed Weights   09/05/20 0935 09/05/20 1006  Weight: 103 kg 108.2 kg    Exam:  GEN: NAD SKIN: No rash EYES: EOMI ENT: MMM CV: RRR PULM: CTA B ABD: soft, obese, NT, +BS CNS: AAO x 3, speech is clear, power in LUE 2/5, LLE 3/5 EXT: No edema or tenderness         Data Reviewed:   I have personally reviewed following labs and imaging studies:  Labs: Labs show the following:   Basic Metabolic Panel: Recent Labs  Lab 09/05/20 0937 09/05/20 0955  09/06/20 0511 09/07/20 0543 09/08/20 0551  NA 137  --  139 137 139  K 4.2  --  3.5 3.5 3.7  CL 107  --  106 105 106  CO2 23  --  25 25 24   GLUCOSE 115*  --  113* 126* 119*  BUN 21*  --  22* 24* 28*  CREATININE 1.31* 1.30* 1.43* 1.26* 1.37*  CALCIUM 9.4  --  9.6 9.4 9.4  MG  --   --  2.0  --   --   PHOS  --   --  3.9  --   --    GFR Estimated Creatinine Clearance: 71.4 mL/min (A) (by C-G formula based on SCr of 1.37 mg/dL (H)). Liver Function Tests: Recent Labs  Lab 09/05/20 0937  AST 23  ALT 32  ALKPHOS 58  BILITOT 0.9  PROT 7.5  ALBUMIN 4.4   No results for input(s): LIPASE, AMYLASE in the last 168 hours. No results for input(s): AMMONIA in the last 168 hours. Coagulation profile Recent Labs  Lab 09/05/20 0937 09/06/20 0511  INR 1.1 1.2    CBC: Recent Labs  Lab 09/05/20 0937 09/06/20 0511 09/07/20 0543 09/08/20 0551  WBC 7.2 8.1 8.4 6.7  NEUTROABS 5.2  --   --   --   HGB 13.7 13.8 14.2 13.7  HCT 43.2 43.9 44.3 43.1  MCV 89.4 88.9 87.9 89.6  PLT 194 191 189 179   Cardiac Enzymes: No results for input(s): CKTOTAL, CKMB, CKMBINDEX, TROPONINI in the last 168 hours. BNP (last 3 results) No results for input(s): PROBNP in the last 8760 hours. CBG: No results for input(s): GLUCAP in the last 168 hours. D-Dimer: No results for input(s): DDIMER in the last 72 hours. Hgb A1c: Recent Labs    09/06/20 0511  HGBA1C 4.7*   Lipid Profile: Recent Labs    09/06/20 0511  CHOL 238*  HDL 38*  LDLCALC 165*  TRIG 174*  CHOLHDL 6.3   Thyroid function studies: Recent Labs    09/06/20 0511  TSH 0.608   Anemia work up: No results for input(s): VITAMINB12, FOLATE, FERRITIN, TIBC, IRON, RETICCTPCT in the last 72 hours. Sepsis Labs: Recent Labs  Lab 09/05/20 0937 09/06/20 0511 09/07/20 0543 09/08/20 0551  WBC 7.2 8.1 8.4 6.7    Microbiology Recent Results (from the past 240 hour(s))  Resp Panel by RT-PCR (Flu A&B, Covid) Nasopharyngeal Swab      Status: None   Collection Time: 09/05/20  9:37 AM   Specimen: Nasopharyngeal Swab; Nasopharyngeal(NP) swabs in vial transport medium  Result Value Ref Range Status   SARS Coronavirus 2 by RT PCR NEGATIVE NEGATIVE Final    Comment: (NOTE) SARS-CoV-2 target nucleic acids are NOT DETECTED.  The SARS-CoV-2 RNA is generally detectable in upper respiratory specimens during the acute phase of infection. The lowest concentration of SARS-CoV-2 viral copies this assay can detect is 138 copies/mL. A negative result does not preclude SARS-Cov-2 infection and should not be used as the sole basis for treatment or other patient management decisions. A negative result may occur with  improper specimen collection/handling, submission of specimen other than nasopharyngeal swab, presence of viral mutation(s) within the areas targeted by this assay, and inadequate number of viral copies(<138 copies/mL). A negative result must be combined with clinical observations, patient history, and epidemiological information. The expected result is Negative.  Fact Sheet for Patients:  BloggerCourse.com  Fact Sheet for Healthcare Providers:  SeriousBroker.it  This test is no t yet approved or cleared by the [place] FDA and  has been authorized for detection and/or diagnosis of SARS-CoV-2 by FDA under an Emergency Use Authorization (EUA). This EUA will remain  in effect (meaning this test can be used) for the duration of the COVID-19 declaration under Section 564(b)(1) of the Act, 21 U.S.C.section 360bbb-3(b)(1), unless the authorization is terminated  or revoked sooner.       Influenza A by PCR NEGATIVE NEGATIVE Final   Influenza B by PCR NEGATIVE NEGATIVE Final    Comment: (NOTE) The Xpert Xpress SARS-CoV-2/FLU/RSV plus assay is intended as an aid in the diagnosis of influenza from Nasopharyngeal swab specimens and should not be used as a sole basis  for treatment. Nasal washings and aspirates are unacceptable for Xpert Xpress SARS-CoV-2/FLU/RSV testing.  Fact Sheet for Patients: BloggerCourse.com  Fact Sheet for Healthcare Providers: SeriousBroker.it  This test is not yet approved or cleared by the [place] FDA and has been authorized for detection and/or diagnosis of SARS-CoV-2 by FDA under an Emergency Use Authorization (EUA). This EUA will remain in effect (meaning this test can be used) for the duration of the COVID-19 declaration under Section 564(b)(1) of the Act, 21 U.S.C. section 360bbb-3(b)(1), unless the authorization is terminated or revoked.  Performed at [facility], [address] [place], [place] [address]   MRSA PCR Screening     Status: None   Collection Time: 09/05/20  8:00 PM   Specimen: Nasopharyngeal  Result Value Ref Range Status   MRSA by PCR NEGATIVE NEGATIVE Final    Comment:        The GeneXpert MRSA Assay (FDA approved for NASAL specimens only), is one component of a comprehensive MRSA colonization surveillance program. It is not intended to diagnose MRSA infection nor to guide or monitor treatment for MRSA infections. Performed at [facility], [address] [place], [place] [address]     Procedures and diagnostic studies:  CT HEAD WO CONTRAST  Result Date: 09/08/2020 CLINICAL DATA:  Stroke post tPA with small hemorrhage, follow-up EXAM: CT HEAD WITHOUT CONTRAST TECHNIQUE: Contiguous axial images were obtained from the base of the skull through the vertex without intravenous contrast. COMPARISON:  09/06/2020 FINDINGS: Brain: Small focus of hyperdensity is again identified at the posterior aspect of the left putamen, which was not present on 09/05/2020. There is hypoattenuation within the right basal ganglia and adjacent white matter corresponding to acute infarction on MRI. Additional punctate left temporal  infarct on  MRI is not seen due to size. Stable findings of chronic microvascular ischemic changes. Small chronic infarct of the right centrum semiovale. Ventricles are stable in size. Vascular: No new findings. Skull: Calvarium is unremarkable. Sinuses/Orbits: No acute finding. Other: None. IMPRESSION: Stable punctate left basal ganglia hemorrhage. Evolving recent infarction of right basal ganglia and adjacent white matter. Electronically Signed   By: Guadlupe Spanish M.D.   On: 09/08/2020 12:58               LOS: 3 days   Micheline Markes  [facility]   Pager on [email or web address]. If 7PM-7AM, please contact night-coverage at [email or web address]     09/08/2020, 2:00 PM

## 2020-09-08 NOTE — Progress Notes (Signed)
Inpatient Rehab Admissions Coordinator:   Note pt progressing with therapy.  Still requiring cardene drip for BP management.  Will follow for timing of beginning insurance auth once pt's BP is stable off drips.    Estill Dooms, PT, DPT Admissions Coordinator [phone] 09/08/20  4:38 PM

## 2020-09-08 NOTE — Progress Notes (Signed)
Neurology Progress Note  Patient ID: Darryl Sullivan is a 55 y.o. with PMHx of stroke risk factors of obesity and smoking, and limited interface with medical care presenting with likely lacunar stroke, within the tPA window.  Given potential for stuttering lacunar stroke with worsening of symptoms once outside of the tPA window, decision was made to proceed with tPA.    Subjective: - No change in LUE>LLE weakness since yesterday - Denies headache or any other acute complaints - Nicardipine gtt restarted this AM for SBP> 160  Interval data:  Repeat [facility] 09/08/20 Stable punctate left basal ganglia hemorrhage. Evolving recent infarction of right basal ganglia and adjacent white Matter.  CNS imaging personally reviewed  Prior data: TTE 09/05/20: LVEF 60-65%, grade I diastolic dysfunction, no significant valvular abnl, no visualized intracardiac clot  Exam: Current vital signs: BP (!) 165/87   Pulse (!) 112   Temp 98.6 F (37 C) (Oral)   Resp 16   Ht 5\' 7"  (1.702 m)   Wt 108.2 kg   SpO2 96%   BMI 37.35 kg/m  Vital signs in last 24 hours: Temp:  [98.2 F (36.8 C)-98.8 F (37.1 C)] 98.6 F (37 C) (03/31 1145) Pulse Rate:  [70-116] 112 (03/31 1315) Resp:  [11-29] 16 (03/31 1315) BP: (112-194)/(46-111) 165/87 (03/31 1315) SpO2:  [83 %-100 %] 96 % (03/31 1315)  Gen: In bed, comfortable  Resp: non-labored breathing, no grossly audible wheezing Cardiac: Perfusing extremities well  Abd: soft, nt  Neuro: MS: Alert, awake, oriented.  No longer has any difficulty with repetition, naming intact.  Follows commands easily. CN: Visual fields intact to confrontation, face symmetric, tongue midline, mild left nasolabial fold flattening baseline per patient, mild dysarthria though he reports this is his baseline speech Motor: Left upper extremity is 2/5 throughout.  Left lower extremity is 4/5 at the hip Sensory: Intact to light touch throughout  Pertinent Data: Cr increase from 1.3 to  1.43   Lab Results  Component Value Date   CHOL 238 (H) 09/06/2020   HDL 38 (L) 09/06/2020   LDLCALC 165 (H) 09/06/2020   TRIG 174 (H) 09/06/2020   CHOLHDL 6.3 09/06/2020   Lab Results  Component Value Date   HGBA1C 4.7 (L) 09/06/2020  However notably his fasting glucose this morning was 113  Lab Results  Component Value Date   TSH 0.608 09/06/2020    No results found for: RPR  No results found for: HIV1X2  Impression: This is a 55 year old male with a past medical history significant for obesity, smoking, hyperlipidemia (newly diagnosed), impaired fasting blood glucose (though A1c is 4.7%), likely uncontrolled hypertension.  Strokes overall appear to be related to small vessel disease however the right corona radiata lesion is big enough and given the pattern of multiple vascular distributions involved, patient will need event monitor on discharge since echo did not show intracardiac thrombus.  CT showed small hemorrhagic conversion but patient has been clinically stable.  The small hemorrhage would be expected to cause right-sided symptoms including the initial mild aphasia on my admission examination (which has since improved). No role for reversal at this time given clinical and radiographic stability of the hemorrhagic lesion but will need continued close monitoring and strict blood pressure control as below  ICH score 09/06/20 was 0  Repeat CT head 09/08/20 shows stability of small hemorrhagic transformation  Recommendations:  # Multifocal lacunar strokes versus potentially cardioembolic strokes # Minor hemorrhagic conversion of left sub-insular stroke stroke after tPA administration -  Start ASA 81mg  daily - Start pharmacologic DVT prophylaxis enoxaparin - q4 hr neurochecks -SBP goal < 160 (strict), discussed with ICU team -DVT PPx with SCDs for now given hemorrhage -Risk factor modification counseling, patient counseled on diet, exercise, smoking cessation, need for  outpatient follow-up including sleep study, medication compliance (new diagnosis of hyperlipidemia and impaired fasting blood glucose) -Outpatient sleep study given snoring at night and potential for sleep apnea based on body habitus -PT/OT/SLP evaluation -Neurology will continue to follow - Amb cardiac monitoring after discharge  # Hyperlipidemia, new diagnosis -Initiation of atorvastatin 80 mg nightly  # Impaired fasting blood glucose with low-normal A1c -Outpatient PCP follow-up  , MD [facility] [phone]  If 7pm- 7am, please page neurology on call as listed in AMION.

## 2020-09-08 NOTE — Progress Notes (Signed)
Physical Therapy Treatment Patient Details Name: Darryl Sullivan MRN: [id number] DOB: 1965/08/22 Today's Date: 09/08/2020    History of Present Illness Darryl Sullivan is a 55 year old male with a past medical history significant for tobacco abuse who presents to [facility] ED on 09/05/2020 due to complaints of left-sided weakness and presyncope.      He reports that at work this morning he developed acute onset of left arm and leg weakness at approximately 07:00 a.m.  He denies facial asymmetry, slurred speech, headache, chest pain, palpitations, shortness of breath, cough, neck pain, back pain, dysuria.  Denies any history of stroke, high blood pressure, diabetes mellitus, alcohol or illicit drug abuse.  He is now on any blood thinners. CT shows small areas of ischemia in R basal ganglia and L temporal white matter.    PT Comments    Patient received in bed, agrees to PT session. Patient requires min assist for supine to sit. Transfers with min assist, left knee and foot blocked to assist with sit to stand. Able to take a few steps with mod +2 assist using hemi-walker. Heavy cues needed for foot placement and sequencing. Chair exercises performed for L LE strengthening. Patient will continue to benefit from skilled PT while here to improve functional mobility and independence.      Follow Up Recommendations  CIR     Equipment Recommendations  None recommended by PT    Recommendations for Other Services Rehab consult     Precautions / Restrictions Precautions Precautions: Fall Restrictions Weight Bearing Restrictions: No    Mobility  Bed Mobility Overal bed mobility: Needs Assistance Bed Mobility: Supine to Sit     Supine to sit: Min assist     General bed mobility comments: Patient requires min assist to raise trunk to seated position.    Transfers Overall transfer level: Needs assistance Equipment used: Hemi-walker Transfers: Sit to/from Stand Sit to Stand: Min assist             Ambulation/Gait Ambulation/Gait assistance: Mod assist;+2 physical assistance Gait Distance (Feet): 4 Feet Assistive device: Hemi-walker Gait Pattern/deviations: Step-to pattern;Decreased stride length;Decreased step length - left;Narrow base of support Gait velocity: decr   General Gait Details: patient requires mod +2 for taking steps with hemi-walker. He requires cues for foot placement and sequencing. Mod lean to left at times.   Stairs             Wheelchair Mobility    Modified Rankin (Stroke Patients Only) Modified Rankin (Stroke Patients Only) Pre-Morbid Rankin Score: No symptoms Modified Rankin: Moderately severe disability     Balance Overall balance assessment: Needs assistance Sitting-balance support: Feet supported;Feet unsupported Sitting balance-Leahy Scale: Good Sitting balance - Comments: able to maintain sitting independently   Standing balance support: Single extremity supported;During functional activity Standing balance-Leahy Scale: Fair Standing balance comment: patient reliant on single UE support and min assist for safety wtih pivot transfer, mod assist +2 for gait                            Cognition Arousal/Alertness: Awake/alert Behavior During Therapy: WFL for tasks assessed/performed Overall Cognitive Status: Within Functional Limits for tasks assessed                                        Exercises Other Exercises Other Exercises: L LE seated LAQ, marching,  heel slides, AA hip abd./add,  PROM for L heel cord stretch x 10 each    General Comments        Pertinent Vitals/Pain Pain Assessment: No/denies pain    Home Living                      Prior Function            PT Goals (current goals can now be found in the care plan section) Acute Rehab PT Goals Patient Stated Goal: to get better PT Goal Formulation: With patient Time For Goal Achievement: 09/20/20 Potential to Achieve  Goals: Good Progress towards PT goals: Progressing toward goals    Frequency    7X/week      PT Plan Current plan remains appropriate    Co-evaluation              AM-PAC PT "6 Clicks" Mobility   Outcome Measure  Help needed turning from your back to your side while in a flat bed without using bedrails?: A Little Help needed moving from lying on your back to sitting on the side of a flat bed without using bedrails?: A Lot Help needed moving to and from a bed to a chair (including a wheelchair)?: A Lot Help needed standing up from a chair using your arms (e.g., wheelchair or bedside chair)?: A Little Help needed to walk in hospital room?: A Lot Help needed climbing 3-5 steps with a railing? : Total 6 Click Score: 13    End of Session Equipment Utilized During Treatment: Gait belt Activity Tolerance: Patient tolerated treatment well Patient left: in chair;with call bell/phone within reach Nurse Communication: Mobility status PT Visit Diagnosis: Unsteadiness on feet (R26.81);Muscle weakness (generalized) (M62.81);Difficulty in walking, not elsewhere classified (R26.2);Hemiplegia and hemiparesis;Other abnormalities of gait and mobility (R26.89) Hemiplegia - Right/Left: Left Hemiplegia - dominant/non-dominant: Non-dominant Hemiplegia - caused by: Cerebral infarction     Time: 1315-1346 PT Time Calculation (min) (ACUTE ONLY): 31 min  Charges:  $Gait Training: 8-22 mins $Therapeutic Exercise: 8-22 mins                     Darryl Sullivan, PT, GCS 09/08/20,1:59 PM

## 2020-09-08 NOTE — Progress Notes (Signed)
Nicardipine gtt restarted this AM @ 0600 d/t SBP > 160. Foley in place, putting out 1.4L clear yellow urine. Pt's neuro status remains the same overnight.

## 2020-09-08 NOTE — Plan of Care (Addendum)
Stroke education continued today. Discussed s/s of stroke, when to call 911 and seek medical care, individual risk factors (including tobacco use and hypertension), expected discharge medications and follow up plans. Patient has not been followed by PCP for several years. Discussed importance of obtaining a PCP for regular follow up care. Encouraged patient to obtain BP monitor at home for him to keep record of daily BP. Discussed lifestyle changes to reduce risk of stroke and improve hypertension.   Foley was removed this shift. Patient able to spontaneously void without any difficulty. BP improving, on scheduled amlodipine and hydrochlorothiazide. Intermittently required nicardipine infusion but able to wean off with one dose of PRN labetalol.   Patient continues to participate in PT/OT and has tolerated OOB to chair and transfers to [facility].   Chaplain requested to patient bedside, as he has verbalized wishes to make his daughter his health care POA should he not be able to make decisions. Documents left at bedside for completion.

## 2020-09-08 NOTE — Progress Notes (Signed)
Occupational Therapy Treatment Patient Details Name: Darryl Sullivan MRN: [id number] DOB: 1966/05/03 Today's Date: 09/08/2020    History of present illness Darryl Sullivan is a 55 year old male with a past medical history significant for tobacco abuse who presents to [facility] ED on 09/05/2020 due to complaints of left-sided weakness and presyncope.      He reports that at work this morning he developed acute onset of left arm and leg weakness at approximately 07:00 a.m.  He denies facial asymmetry, slurred speech, headache, chest pain, palpitations, shortness of breath, cough, neck pain, back pain, dysuria.  Denies any history of stroke, high blood pressure, diabetes mellitus, alcohol or illicit drug abuse.  He is now on any blood thinners. CT shows small areas of ischemia in R basal ganglia and L temporal white matter.   OT comments  Pt seen for OT tx this date. Pt eager to participate despite having just assisted back to bed by nursing after being in recliner and to [facility] and recent PT session. OT facilitated PROM and AAROM for LUE gross motor and fine motor movements, including composite finger flex/ext, thumb ROM, wrist flex/ext, elbow flex/ext, shoulder flex/ext/abd/add/ER/IR/elevation; pt noted with improved muscle activation for elbow flex/ext and shoulder flexion, however noted mild increase in resistence to elbow extension/shoulder ER/supination in mid to end ROM. Pt instructed in LUE positioning and self ROM and positioning with pt able to return demo technique and verbalizes understanding of importance. Pt continues to benefit from high intensity skilled therapy services to maximize stroke recovery, minimize risk of falls, increased caregiver burden, and maximize independence. Continue to recommend CIR.    Follow Up Recommendations  CIR    Equipment Recommendations  3 in 1 bedside commode    Recommendations for Other Services      Precautions / Restrictions Precautions Precautions:  Fall Restrictions Weight Bearing Restrictions: No       Mobility Bed Mobility Overal bed mobility: Needs Assistance Bed Mobility: Supine to Sit     Supine to sit: Min assist     General bed mobility comments: deferred, just back to bed with staff after recliner and BSC    Transfers Overall transfer level: Needs assistance Equipment used: Hemi-walker Transfers: Sit to/from Stand Sit to Stand: Min assist         General transfer comment: deferred, just back to bed with staff after recliner and BSC    Balance Overall balance assessment: Needs assistance Sitting-balance support: Feet supported;Feet unsupported Sitting balance-Leahy Scale: Good Sitting balance - Comments: able to maintain sitting independently   Standing balance support: Single extremity supported;During functional activity Standing balance-Leahy Scale: Fair Standing balance comment: patient reliant on single UE support and min assist for safety wtih pivot transfer, mod assist +2 for gait                           ADL either performed or assessed with clinical judgement   ADL                                               Vision       Perception     Praxis      Cognition Arousal/Alertness: Awake/alert Behavior During Therapy: WFL for tasks assessed/performed Overall Cognitive Status: Within Functional Limits for tasks assessed  General Comments: eager to participate        Exercises Other Exercises Other Exercises: L LE seated LAQ, marching,  heel slides, AA hip abd./add,  PROM for L heel cord stretch x 10 each Other Exercises: OT facilitated PROM and AAROM for LUE gross motor and fine motor movements, including composite finger flex/ext, thumb ROM, wrist flex/ext, elbow flex/ext, shoulder flex/ext/abd/add/ER/IR/elevation; pt noted with improved muscle activation for elbow flex/ext and shoulder flexion, however noted  mild increase in resistence to elbow extension/shoulder ER/supination. Pt instructed in LUE positioning and self ROM and positioning with pt able to return demo technique and verbalizes understanding of importance.   Shoulder Instructions       General Comments      Pertinent Vitals/ Pain       Pain Assessment: Faces Faces Pain Scale: Hurts a little bit Pain Location: with L shoulder mid to end AAROM Pain Descriptors / Indicators: Grimacing Pain Intervention(s): Limited activity within patient's tolerance;Monitored during session;Repositioned  Home Living                                          Prior Functioning/Environment              Frequency  Min 3X/week        Progress Toward Goals  OT Goals(current goals can now be found in the care plan section)  Progress towards OT goals: Progressing toward goals  Acute Rehab OT Goals Patient Stated Goal: to get better OT Goal Formulation: With patient Time For Goal Achievement: 09/20/20 Potential to Achieve Goals: Good  Plan Discharge plan remains appropriate;Frequency remains appropriate    Co-evaluation                 AM-PAC OT "6 Clicks" Daily Activity     Outcome Measure   Help from another person eating meals?: None (using dominant hand) Help from another person taking care of personal grooming?: A Little Help from another person toileting, which includes using toliet, bedpan, or urinal?: A Lot Help from another person bathing (including washing, rinsing, drying)?: A Lot Help from another person to put on and taking off regular upper body clothing?: A Lot Help from another person to put on and taking off regular lower body clothing?: A Lot 6 Click Score: 15    End of Session    OT Visit Diagnosis: Other abnormalities of gait and mobility (R26.89);Hemiplegia and hemiparesis Hemiplegia - Right/Left: Left Hemiplegia - dominant/non-dominant: Non-Dominant Hemiplegia - caused by:  Cerebral infarction   Activity Tolerance Patient tolerated treatment well   Patient Left in bed;with call bell/phone within reach;with bed alarm set   Nurse Communication          Time: 4403-4742 OT Time Calculation (min): 31 min  Charges: OT General Charges $OT Visit: 1 Visit OT Treatments $Neuromuscular Re-education: 23-37 mins  Wynona Canes, MPH, MS, OTR/L ascom [phone] 09/08/20, 4:30 PM

## 2020-09-09 MED ORDER — HYDROCHLOROTHIAZIDE 25 MG PO TABS
25.0000 mg | ORAL_TABLET | Freq: Every day | ORAL | Status: DC
  Administered 2020-09-10 – 2020-09-14 (×5): 25 mg via ORAL
  Filled 2020-09-09 (×5): qty 1

## 2020-09-09 MED ORDER — HYDROCHLOROTHIAZIDE 12.5 MG PO CAPS
12.5000 mg | ORAL_CAPSULE | Freq: Once | ORAL | Status: AC
  Administered 2020-09-09: 17:00:00 12.5 mg via ORAL
  Filled 2020-09-09 (×2): qty 1

## 2020-09-09 NOTE — Progress Notes (Signed)
OT Cancellation Note  Patient Details Name: Darryl Sullivan MRN: [id number] DOB: June 21, 1965   Cancelled Treatment:    Reason Eval/Treat Not Completed: Other (comment). Pt with RN upon attempt. Will re-attempt at later date/time as available.  Wynona Canes, MPH, MS, OTR/L ascom [phone] 09/09/20, 4:02 PM

## 2020-09-09 NOTE — Progress Notes (Signed)
Neuro status remains the same overnight. Pt remains on room air. Voiding independently w/ urinal (800 out). Nicardipine gtt remains OFF overnight. SBP remains < 160 while resting overnight. Pt in no acute distress @ this time. Will continue to monitor.

## 2020-09-09 NOTE — Progress Notes (Signed)
Physical Therapy Treatment Patient Details Name: Darryl Sullivan MRN: [id number] DOB: [birth year] Today's Date: 09/09/2020    History of Present Illness Alcide Memoli is a 55 year old male with a past medical history significant for tobacco abuse who presents to [facility] ED on 09/05/2020 due to complaints of left-sided weakness and presyncope.      He reports that at work this morning he developed acute onset of left arm and leg weakness at approximately 07:00 a.m.  He denies facial asymmetry, slurred speech, headache, chest pain, palpitations, shortness of breath, cough, neck pain, back pain, dysuria.  Denies any history of stroke, high blood pressure, diabetes mellitus, alcohol or illicit drug abuse.  He is now on any blood thinners. CT shows small areas of ischemia in R basal ganglia and L temporal white matter.    PT Comments    Patient received in bed napping. Easily wakes to my voice. Patient is agreeable to PT session. He reports no new events. Patient requires min assist with supine to sit with heavy use of bed rail with R UE. He is able to sit unsupported at edge of bed. He requires min assist for standing with cues for safety. Patient able to ambulate 6 feet with hemi-walker and mod assist. Max cues needed for left LE placement, sequencing with AD, safety and assist to block left knee during weight bearing. Patient progressing well and will continue to benefit from skilled PT while here to improve functional independence and strength for maximal recovery.         Follow Up Recommendations  CIR     Equipment Recommendations  None recommended by PT;Other (comment) (TBD)    Recommendations for Other Services Rehab consult     Precautions / Restrictions Precautions Precautions: Fall Restrictions Weight Bearing Restrictions: No    Mobility  Bed Mobility Overal bed mobility: Needs Assistance Bed Mobility: Supine to Sit     Supine to sit: Min assist     General bed mobility comments:  Patient requires min assist to raise trunk to seated position.    Transfers Overall transfer level: Needs assistance Equipment used: Hemi-walker Transfers: Sit to/from Stand Sit to Stand: Min assist            Ambulation/Gait Ambulation/Gait assistance: Mod assist Gait Distance (Feet): 6 Feet Assistive device: Hemi-walker Gait Pattern/deviations: Step-to pattern;Decreased step length - left;Decreased step length - right;Shuffle;Narrow base of support Gait velocity: decr   General Gait Details: Patient able to walk 6 feet with +1 mod assist. Requires cues for L foot stepping and placement, sequencing with Hemi-walker, blocking of left knee.   Stairs             Wheelchair Mobility    Modified Rankin (Stroke Patients Only) Modified Rankin (Stroke Patients Only) Pre-Morbid Rankin Score: No symptoms Modified Rankin: Moderately severe disability     Balance Overall balance assessment: Needs assistance Sitting-balance support: Feet supported Sitting balance-Leahy Scale: Good Sitting balance - Comments: able to maintain sitting independently   Standing balance support: Single extremity supported;During functional activity Standing balance-Leahy Scale: Fair Standing balance comment: patient reliant on single UE support and min assist for safety wtih pivot transfer, mod assist for gait                            Cognition Arousal/Alertness: Awake/alert Behavior During Therapy: WFL for tasks assessed/performed Overall Cognitive Status: Within Functional Limits for tasks assessed  General Comments: Really nice guy, always willing to do what I ask.      Exercises Other Exercises Other Exercises: L LE seated LAQ, marching,  heel slides, AA hip abd./add,  PROM for L heel cord stretch x 10 each, SLR x 5 reps    General Comments        Pertinent Vitals/Pain Pain Assessment: No/denies pain    Home Living                       Prior Function            PT Goals (current goals can now be found in the care plan section) Acute Rehab PT Goals Patient Stated Goal: to get better PT Goal Formulation: With patient Time For Goal Achievement: 09/20/20 Potential to Achieve Goals: Good Progress towards PT goals: Progressing toward goals    Frequency    7X/week      PT Plan Current plan remains appropriate    Co-evaluation              AM-PAC PT "6 Clicks" Mobility   Outcome Measure  Help needed turning from your back to your side while in a flat bed without using bedrails?: A Little Help needed moving from lying on your back to sitting on the side of a flat bed without using bedrails?: A Lot Help needed moving to and from a bed to a chair (including a wheelchair)?: A Lot Help needed standing up from a chair using your arms (e.g., wheelchair or bedside chair)?: A Little Help needed to walk in hospital room?: A Lot Help needed climbing 3-5 steps with a railing? : Total 6 Click Score: 13    End of Session Equipment Utilized During Treatment: Gait belt Activity Tolerance: Patient tolerated treatment well Patient left: in chair;with call bell/phone within reach Nurse Communication: Mobility status PT Visit Diagnosis: Unsteadiness on feet (R26.81);Muscle weakness (generalized) (M62.81);Difficulty in walking, not elsewhere classified (R26.2);Hemiplegia and hemiparesis;Other abnormalities of gait and mobility (R26.89) Hemiplegia - Right/Left: Left Hemiplegia - dominant/non-dominant: Non-dominant Hemiplegia - caused by: Cerebral infarction     Time: 1450-1525 PT Time Calculation (min) (ACUTE ONLY): 35 min  Charges:  $Gait Training: 8-22 mins $Therapeutic Exercise: 8-22 mins                     Kaelynne Christley, PT, GCS 09/09/20,3:35 PM

## 2020-09-09 NOTE — Progress Notes (Signed)
Inpatient Rehab Admissions Coordinator:   Note pt's BP has been stable off cardiac drips overnight.  Case opened for prior auth request with [facility].  Will follow for possible admit pending approval, likely early next week.    Estill Dooms, PT, DPT Admissions Coordinator [phone] 09/09/20  12:20 PM

## 2020-09-09 NOTE — Progress Notes (Signed)
Neurology Progress Note  Patient ID: Darryl Sullivan is a 55 y.o. with PMHx of stroke risk factors of obesity and smoking, and limited interface with medical care presenting with likely lacunar stroke, within the tPA window.  Given potential for stuttering lacunar stroke with worsening of symptoms once outside of the tPA window, decision was made to proceed with tPA.    Subjective: - Speech and L sided weakness gradually improving each day - Denies headache or any other acute complaints - Started on ASA 81mg  daily and pharmacologic DVT prophylaxis yesterday  Interval data:  Repeat Darryl Sullivan 09/08/20 Stable punctate left basal ganglia hemorrhage. Evolving recent infarction of right basal ganglia and adjacent white Matter.  CNS imaging personally reviewed  Prior data: TTE 09/05/20: LVEF 60-65%, grade I diastolic dysfunction, no significant valvular abnl, no visualized intracardiac clot  Exam: Current vital signs: BP (!) 156/91   Pulse 78   Temp 98.1 F (36.7 C) (Oral)   Resp 16   Ht 5\' 7"  (1.702 m)   Wt 108.2 kg   SpO2 91%   BMI 37.35 kg/m  Vital signs in last 24 hours: Temp:  [98.1 F (36.7 C)-98.6 F (37 C)] 98.1 F (36.7 C) (04/01 0400) Pulse Rate:  [73-118] 78 (04/01 0900) Resp:  [13-16] 16 (03/31 1415) BP: (122-184)/(63-102) 156/91 (04/01 0900) SpO2:  [86 %-99 %] 91 % (04/01 0900)  Gen: In bed, comfortable  Resp: non-labored breathing, no grossly audible wheezing Cardiac: Perfusing extremities well  Abd: soft, nt  Neuro: MS: Alert, awake, oriented.  No longer has any difficulty with repetition, naming intact.  Follows commands easily. CN: Visual fields intact to confrontation, face symmetric, tongue midline, mild left nasolabial fold flattening baseline per patient, mild dysarthria though he reports this is his baseline speech Motor: Left upper extremity is 2/5 throughout.  Left lower extremity is 4/5 at the hip Sensory: Intact to light touch throughout  Pertinent  Data: Cr increase from 1.3 to 1.43   Lab Results  Component Value Date   CHOL 238 (H) 09/06/2020   HDL 38 (L) 09/06/2020   LDLCALC 165 (H) 09/06/2020   TRIG 174 (H) 09/06/2020   CHOLHDL 6.3 09/06/2020   Lab Results  Component Value Date   HGBA1C 4.7 (L) 09/06/2020  However notably his fasting glucose this morning was 113  Lab Results  Component Value Date   TSH 0.608 09/06/2020    No results found for: RPR  No results found for: HIV1X2  Impression: This is a 55 year old male with a past medical history significant for obesity, smoking, hyperlipidemia (newly diagnosed), impaired fasting blood glucose (though A1c is 4.7%), likely uncontrolled hypertension.  Strokes overall appear to be related to small vessel disease however the right corona radiata lesion is big enough and given the pattern of multiple vascular distributions involved, patient will need event monitor on discharge since echo did not show intracardiac thrombus.  CT showed small hemorrhagic conversion but patient has been clinically stable.  The small hemorrhage would be expected to cause right-sided symptoms including the initial mild aphasia on my admission examination (which has since improved). No role for reversal at this time given clinical and radiographic stability of the hemorrhagic lesion but will need continued close monitoring and strict blood pressure control as below  ICH score 09/06/20 was 0  Repeat CT head 09/08/20 shows stability of small hemorrhagic transformation  Recommendations:  # Multifocal lacunar strokes versus potentially cardioembolic strokes # Minor hemorrhagic conversion of left sub-insular stroke stroke  after tPA administration - Cont ASA 81mg  daily - Cont pharmacologic DVT prophylaxis enoxaparin - q4 hr neurochecks -SBP goal < 160 (strict), discussed with ICU team -Risk factor modification counseling, patient counseled on diet, exercise, smoking cessation, need for outpatient follow-up  including sleep study, medication compliance (new diagnosis of hyperlipidemia and impaired fasting blood glucose) -Outpatient sleep study given snoring at night and potential for sleep apnea based on body habitus -PT/OT/SLP evaluation -Neurology will continue to follow - Amb cardiac monitoring after discharge  # Hyperlipidemia, new diagnosis -Initiation of atorvastatin 80 mg nightly  # Impaired fasting blood glucose with low-normal A1c -Outpatient PCP follow-up  , MD [facility] [phone]  If 7pm- 7am, please page neurology on call as listed in [facility]ION.

## 2020-09-09 NOTE — Progress Notes (Addendum)
Progress Note    Darryl Sullivan  RUE:[id number] DOB: [birth year]  DOA: 09/05/2020 PCP: Patient, No Pcp Per (Inactive)      Brief Narrative:    Medical records reviewed and are as summarized below:  Darryl Sullivan is a 55 y.o. male with medical history significant for obesity, tobacco use disorder, hypertension, who was brought to the hospital because of left-sided weakness and presyncope.[name].  He was found to have acute right basal ganglia and left temporal white matter infarct/stroke.  He had mild aphasia on admission.  He was given TPA      Assessment/Plan:   Principal Problem:   Cerebrovascular accident (CVA) [facility](HCC) Active Problems:   Hypertensive emergency    Body mass index is 37.35 kg/m.  (Obesity)   Acute stroke s/p TPA with a small hemorrhagic conversion: Repeat CT head today shows stability of punctate left basal ganglia hemorrhage.  Continue aspirin and Lipitor.  PT recommended discharge to CIR. Hemoglobin A1c is 4.7. Ambulatory cardiac monitor recommended at discharge.  Hyperlipidemia: Continue Lipitor  Hypertension: Continue amlodipine and HCTZ.  Increase HCTZ from 12.5 to 25 mg daily.  Lisinopril will be added if BP remains uncontrolled.  Suspected CKD stage II to stage IIIa: Repeat BMP tomorrow  Obesity: Outpatient sleep study recommended for evaluation of OSA.  Transfer from ICU to MedSurg unit.   Diet Order            Diet Heart Room service appropriate? Yes; Fluid consistency: Thin  Diet effective now                    Consultants:  Neurologist  Procedures:  None    Medications:   .  stroke: mapping our early stages of recovery book   Does not apply Once  . amLODipine  10 mg Oral Daily  . aspirin EC  81 mg Oral Daily  . atorvastatin  80 mg Oral QHS  . Chlorhexidine Gluconate Cloth  6 each Topical Daily  . enoxaparin (LOVENOX) injection  40 mg Subcutaneous Q24H  . [START ON 09/10/2020] hydrochlorothiazide  25 mg Oral Daily   . hydrochlorothiazide  12.5 mg Oral Once  . mouth rinse  15 mL Mouth Rinse BID  . pantoprazole  40 mg Oral QHS   Continuous Infusions:    Anti-infectives (From admission, onward)   None             Family Communication/Anticipated D/C date and plan/Code Status   DVT prophylaxis: enoxaparin (LOVENOX) injection 40 mg Start: 09/08/20 2200 Place and maintain sequential compression device Start: 09/06/20 1128 SCD's Start: 09/05/20 1057     Code Status: Full Code  Family Communication: None Disposition Plan:    Status is: Inpatient  Remains inpatient appropriate because:Unsafe d/c plan and Inpatient level of care appropriate due to severity of illness   Dispo: The patient is from: Home              Anticipated d/c is to: Home              Patient currently is not medically stable to d/c.   Difficult to place patient No           Subjective:   C/o weakness in the left upper and lower extremities.  No other complaints.  Objective:    Vitals:   09/09/20 0900 09/09/20 1100 09/09/20 1400 09/09/20 1415  BP: (!) 156/91 (!) 154/81 (!) 169/97 121/76  Pulse: 78 83 (!) 111  85  Resp:      Temp:      TempSrc:      SpO2: 91% 97% 91% 94%  Weight:      Height:       No data found.   Intake/Output Summary (Last 24 hours) at 09/09/2020 1546 Last data filed at 09/09/2020 1408 Gross per 24 hour  Intake 358.71 ml  Output 1700 ml  Net -1341.29 ml   Filed Weights   09/05/20 0935 09/05/20 1006  Weight: 103 kg 108.2 kg    Exam:  GEN: NAD SKIN: No rash EYES: EOMI ENT: MMM CV: RRR PULM: CTA B ABD: soft, obese, NT, +BS CNS: AAO x 3, power LUE-2/5, LLE-3/5 EXT: No edema or tenderness         Data Reviewed:   I have personally reviewed following labs and imaging studies:  Labs: Labs show the following:   Basic Metabolic Panel: Recent Labs  Lab 09/05/20 0937 09/05/20 0955 09/06/20 0511 09/07/20 0543 09/08/20 0551  NA 137  --  139 137 139   K 4.2  --  3.5 3.5 3.7  CL 107  --  106 105 106  CO2 23  --  25 25 24   GLUCOSE 115*  --  113* 126* 119*  BUN 21*  --  22* 24* 28*  CREATININE 1.31* 1.30* 1.43* 1.26* 1.37*  CALCIUM 9.4  --  9.6 9.4 9.4  MG  --   --  2.0  --   --   PHOS  --   --  3.9  --   --    GFR Estimated Creatinine Clearance: 71.4 mL/min (A) (by C-G formula based on SCr of 1.37 mg/dL (H)). Liver Function Tests: Recent Labs  Lab 09/05/20 0937  AST 23  ALT 32  ALKPHOS 58  BILITOT 0.9  PROT 7.5  ALBUMIN 4.4   No results for input(s): LIPASE, AMYLASE in the last 168 hours. No results for input(s): AMMONIA in the last 168 hours. Coagulation profile Recent Labs  Lab 09/05/20 0937 09/06/20 0511  INR 1.1 1.2    CBC: Recent Labs  Lab 09/05/20 0937 09/06/20 0511 09/07/20 0543 09/08/20 0551  WBC 7.2 8.1 8.4 6.7  NEUTROABS 5.2  --   --   --   HGB 13.7 13.8 14.2 13.7  HCT 43.2 43.9 44.3 43.1  MCV 89.4 88.9 87.9 89.6  PLT 194 191 189 179   Cardiac Enzymes: No results for input(s): CKTOTAL, CKMB, CKMBINDEX, TROPONINI in the last 168 hours. BNP (last 3 results) No results for input(s): PROBNP in the last 8760 hours. CBG: No results for input(s): GLUCAP in the last 168 hours. D-Dimer: No results for input(s): DDIMER in the last 72 hours. Hgb A1c: No results for input(s): HGBA1C in the last 72 hours. Lipid Profile: No results for input(s): CHOL, HDL, LDLCALC, TRIG, CHOLHDL, LDLDIRECT in the last 72 hours. Thyroid function studies: No results for input(s): TSH, T4TOTAL, T3FREE, THYROIDAB in the last 72 hours.  Invalid input(s): FREET3 Anemia work up: No results for input(s): VITAMINB12, FOLATE, FERRITIN, TIBC, IRON, RETICCTPCT in the last 72 hours. Sepsis Labs: Recent Labs  Lab 09/05/20 0937 09/06/20 0511 09/07/20 0543 09/08/20 0551  WBC 7.2 8.1 8.4 6.7    Microbiology Recent Results (from the past 240 hour(s))  Resp Panel by RT-PCR (Flu A&B, Covid) Nasopharyngeal Swab     Status: None    Collection Time: 09/05/20  9:37 AM   Specimen: Nasopharyngeal Swab; Nasopharyngeal(NP) swabs in vial transport  medium  Result Value Ref Range Status   SARS Coronavirus 2 by RT PCR NEGATIVE NEGATIVE Final    Comment: (NOTE) SARS-CoV-2 target nucleic acids are NOT DETECTED.  The SARS-CoV-2 RNA is generally detectable in upper respiratory specimens during the acute phase of infection. The lowest concentration of SARS-CoV-2 viral copies this assay can detect is 138 copies/mL. A negative result does not preclude SARS-Cov-2 infection and should not be used as the sole basis for treatment or other patient management decisions. A negative result may occur with  improper specimen collection/handling, submission of specimen other than nasopharyngeal swab, presence of viral mutation(s) within the areas targeted by this assay, and inadequate number of viral copies(<138 copies/mL). A negative result must be combined with clinical observations, patient history, and epidemiological information. The expected result is Negative.  Fact Sheet for Patients:  BloggerCourse.com  Fact Sheet for Healthcare Providers:  SeriousBroker.it  This test is no t yet approved or cleared by the [place] FDA and  has been authorized for detection and/or diagnosis of SARS-CoV-2 by FDA under an Emergency Use Authorization (EUA). This EUA will remain  in effect (meaning this test can be used) for the duration of the COVID-19 declaration under Section 564(b)(1) of the Act, 21 U.S.C.section 360bbb-3(b)(1), unless the authorization is terminated  or revoked sooner.       Influenza A by PCR NEGATIVE NEGATIVE Final   Influenza B by PCR NEGATIVE NEGATIVE Final    Comment: (NOTE) The Xpert Xpress SARS-CoV-2/FLU/RSV plus assay is intended as an aid in the diagnosis of influenza from Nasopharyngeal swab specimens and should not be used as a sole basis for treatment.  Nasal washings and aspirates are unacceptable for Xpert Xpress SARS-CoV-2/FLU/RSV testing.  Fact Sheet for Patients: BloggerCourse.com  Fact Sheet for Healthcare Providers: SeriousBroker.it  This test is not yet approved or cleared by the [place] FDA and has been authorized for detection and/or diagnosis of SARS-CoV-2 by FDA under an Emergency Use Authorization (EUA). This EUA will remain in effect (meaning this test can be used) for the duration of the COVID-19 declaration under Section 564(b)(1) of the Act, 21 U.S.C. section 360bbb-3(b)(1), unless the authorization is terminated or revoked.  Performed at [facility], [address] [place], [place] [address]   MRSA PCR Screening     Status: None   Collection Time: 09/05/20  8:00 PM   Specimen: Nasopharyngeal  Result Value Ref Range Status   MRSA by PCR NEGATIVE NEGATIVE Final    Comment:        The GeneXpert MRSA Assay (FDA approved for NASAL specimens only), is one component of a comprehensive MRSA colonization surveillance program. It is not intended to diagnose MRSA infection nor to guide or monitor treatment for MRSA infections. Performed at [facility], [address] [place], [place] [address]     Procedures and diagnostic studies:  CT HEAD WO CONTRAST  Result Date: 09/08/2020 CLINICAL DATA:  Stroke post tPA with small hemorrhage, follow-up EXAM: CT HEAD WITHOUT CONTRAST TECHNIQUE: Contiguous axial images were obtained from the base of the skull through the vertex without intravenous contrast. COMPARISON:  09/06/2020 FINDINGS: Brain: Small focus of hyperdensity is again identified at the posterior aspect of the left putamen, which was not present on 09/05/2020. There is hypoattenuation within the right basal ganglia and adjacent white matter corresponding to acute infarction on MRI. Additional punctate left temporal infarct on MRI is not  seen due to size. Stable findings of chronic microvascular  ischemic changes. Small chronic infarct of the right centrum semiovale. Ventricles are stable in size. Vascular: No new findings. Skull: Calvarium is unremarkable. Sinuses/Orbits: No acute finding. Other: None. IMPRESSION: Stable punctate left basal ganglia hemorrhage. Evolving recent infarction of right basal ganglia and adjacent white matter. Electronically Signed   By: Guadlupe Spanish M.D.   On: 09/08/2020 12:58               LOS: 4 days   Tien Spooner  [facility]   Pager on [email or web address]. If 7PM-7AM, please contact night-coverage at [email or web address]     09/09/2020, 3:46 PM

## 2020-09-10 LAB — BASIC METABOLIC PANEL
Anion gap: 10 (ref 5–15)
BUN: 36 mg/dL — ABNORMAL HIGH (ref 6–20)
CO2: 26 mmol/L (ref 22–32)
Calcium: 9.8 mg/dL (ref 8.9–10.3)
Chloride: 101 mmol/L (ref 98–111)
Creatinine, Ser: 1.42 mg/dL — ABNORMAL HIGH (ref 0.61–1.24)
GFR, Estimated: 58 mL/min — ABNORMAL LOW (ref >60)
Glucose, Bld: 106 mg/dL — ABNORMAL HIGH (ref 70–99)
Potassium: 3.6 mmol/L (ref 3.5–5.1)
Sodium: 137 mmol/L (ref 135–145)

## 2020-09-10 NOTE — Progress Notes (Signed)
Progress Note    Darryl Sullivan  BJS:[id number] DOB: 1965/07/29  DOA: 09/05/2020 PCP: Patient, No Pcp Per (Inactive)      Brief Narrative:    Medical records reviewed and are as summarized below:  Darryl Sullivan is a 55 y.o. male with medical history significant for obesity, tobacco use disorder, hypertension, who was brought to the hospital because of left-sided weakness and presyncope.[name]  He was found to have acute right basal ganglia and left temporal white matter infarct/stroke.  He had mild aphasia on admission.  He was given TPA      Assessment/Plan:   Principal Problem:   Cerebrovascular accident (CVA) [facility]) Active Problems:   Hypertensive emergency    Body mass index is 37.35 kg/m.  (Obesity)   Acute stroke s/p TPA with a small hemorrhagic conversion: Repeat CT head today shows stability of punctate left basal ganglia hemorrhage.  Continue aspirin and Lipitor.  PT recommended discharge to CIR. Hemoglobin A1c is 4.7. Ambulatory cardiac monitor recommended at discharge.  Hyperlipidemia: Continue Lipitor  Hypertension: Continue antihypertensives.  CKD stage IIIa: Creatinine is stable  Obesity: Outpatient sleep study recommended for evaluation of OSA.     Diet Order            Diet Heart Room service appropriate? Yes; Fluid consistency: Thin  Diet effective now                    Consultants:  Neurologist  Procedures:  None    Medications:   .  stroke: mapping our early stages of recovery book   Does not apply Once  . amLODipine  10 mg Oral Daily  . aspirin EC  81 mg Oral Daily  . atorvastatin  80 mg Oral QHS  . Chlorhexidine Gluconate Cloth  6 each Topical Daily  . enoxaparin (LOVENOX) injection  40 mg Subcutaneous Q24H  . hydrochlorothiazide  25 mg Oral Daily  . mouth rinse  15 mL Mouth Rinse BID  . pantoprazole  40 mg Oral QHS   Continuous Infusions:    Anti-infectives (From admission, onward)   None              Family Communication/Anticipated D/C date and plan/Code Status   DVT prophylaxis: enoxaparin (LOVENOX) injection 40 mg Start: 09/08/20 2200 Place and maintain sequential compression device Start: 09/06/20 1128 SCD's Start: 09/05/20 1057     Code Status: Full Code  Family Communication: None Disposition Plan:    Status is: Inpatient  Remains inpatient appropriate because:Unsafe d/c plan and Inpatient level of care appropriate due to severity of illness   Dispo: The patient is from: Home              Anticipated d/c is to: Home              Patient currently is not medically stable to d/c.   Difficult to place patient No           Subjective:   Interval events noted.  He still weak in the left upper and lower extremities.  He thinks is slowly improving.  Objective:    Vitals:   09/10/20 0006 09/10/20 0420 09/10/20 0713 09/10/20 1109  BP: (!) 143/96 (!) 150/98 (!) 154/97 (!) 131/95  Pulse: 85 82 91 93  Resp: 16 16 16 16   Temp: 98.6 F (37 C) 98.6 F (37 C) 98.5 F (36.9 C) 98.6 F (37 C)  TempSrc: Oral Oral Oral Oral  SpO2:  93% 92% 93% 98%  Weight:      Height:       No data found.   Intake/Output Summary (Last 24 hours) at 09/10/2020 1140 Last data filed at 09/10/2020 1009 Gross per 24 hour  Intake 240 ml  Output 675 ml  Net -435 ml   Filed Weights   09/05/20 0935 09/05/20 1006  Weight: 103 kg 108.2 kg    Exam:  GEN: NAD SKIN: No rash EYES: EOMI ENT: MMM CV: RRR PULM: CTA B ABD: soft, obese, NT, +BS CNS: AAO x 3, mild slurred speech, left facial droop,  Power in LUE-3/5, LLE-3/5 EXT: No edema or tenderness        Data Reviewed:   I have personally reviewed following labs and imaging studies:  Labs: Labs show the following:   Basic Metabolic Panel: Recent Labs  Lab 09/05/20 0937 09/05/20 0955 09/06/20 0511 09/07/20 0543 09/08/20 0551 09/10/20 0413  NA 137  --  139 137 139 137  K 4.2  --  3.5 3.5 3.7  3.6  CL 107  --  106 105 106 101  CO2 23  --  25 25 24 26   GLUCOSE 115*  --  113* 126* 119* 106*  BUN 21*  --  22* 24* 28* 36*  CREATININE 1.31* 1.30* 1.43* 1.26* 1.37* 1.42*  CALCIUM 9.4  --  9.6 9.4 9.4 9.8  MG  --   --  2.0  --   --   --   PHOS  --   --  3.9  --   --   --    GFR Estimated Creatinine Clearance: 68.9 mL/min (A) (by C-G formula based on SCr of 1.42 mg/dL (H)). Liver Function Tests: Recent Labs  Lab 09/05/20 0937  AST 23  ALT 32  ALKPHOS 58  BILITOT 0.9  PROT 7.5  ALBUMIN 4.4   No results for input(s): LIPASE, AMYLASE in the last 168 hours. No results for input(s): AMMONIA in the last 168 hours. Coagulation profile Recent Labs  Lab 09/05/20 0937 09/06/20 0511  INR 1.1 1.2    CBC: Recent Labs  Lab 09/05/20 0937 09/06/20 0511 09/07/20 0543 09/08/20 0551  WBC 7.2 8.1 8.4 6.7  NEUTROABS 5.2  --   --   --   HGB 13.7 13.8 14.2 13.7  HCT 43.2 43.9 44.3 43.1  MCV 89.4 88.9 87.9 89.6  PLT 194 191 189 179   Cardiac Enzymes: No results for input(s): CKTOTAL, CKMB, CKMBINDEX, TROPONINI in the last 168 hours. BNP (last 3 results) No results for input(s): PROBNP in the last 8760 hours. CBG: No results for input(s): GLUCAP in the last 168 hours. D-Dimer: No results for input(s): DDIMER in the last 72 hours. Hgb A1c: No results for input(s): HGBA1C in the last 72 hours. Lipid Profile: No results for input(s): CHOL, HDL, LDLCALC, TRIG, CHOLHDL, LDLDIRECT in the last 72 hours. Thyroid function studies: No results for input(s): TSH, T4TOTAL, T3FREE, THYROIDAB in the last 72 hours.  Invalid input(s): FREET3 Anemia work up: No results for input(s): VITAMINB12, FOLATE, FERRITIN, TIBC, IRON, RETICCTPCT in the last 72 hours. Sepsis Labs: Recent Labs  Lab 09/05/20 0937 09/06/20 0511 09/07/20 0543 09/08/20 0551  WBC 7.2 8.1 8.4 6.7    Microbiology Recent Results (from the past 240 hour(s))  Resp Panel by RT-PCR (Flu A&B, Covid) Nasopharyngeal Swab      Status: None   Collection Time: 09/05/20  9:37 AM   Specimen: Nasopharyngeal Swab; Nasopharyngeal(NP) swabs in  vial transport medium  Result Value Ref Range Status   SARS Coronavirus 2 by RT PCR NEGATIVE NEGATIVE Final    Comment: (NOTE) SARS-CoV-2 target nucleic acids are NOT DETECTED.  The SARS-CoV-2 RNA is generally detectable in upper respiratory specimens during the acute phase of infection. The lowest concentration of SARS-CoV-2 viral copies this assay can detect is 138 copies/mL. A negative result does not preclude SARS-Cov-2 infection and should not be used as the sole basis for treatment or other patient management decisions. A negative result may occur with  improper specimen collection/handling, submission of specimen other than nasopharyngeal swab, presence of viral mutation(s) within the areas targeted by this assay, and inadequate number of viral copies(<138 copies/mL). A negative result must be combined with clinical observations, patient history, and epidemiological information. The expected result is Negative.  Fact Sheet for Patients:  BloggerCourse.com  Fact Sheet for Healthcare Providers:  SeriousBroker.it  This test is no t yet approved or cleared by the [place] FDA and  has been authorized for detection and/or diagnosis of SARS-CoV-2 by FDA under an Emergency Use Authorization (EUA). This EUA will remain  in effect (meaning this test can be used) for the duration of the COVID-19 declaration under Section 564(b)(1) of the Act, 21 U.S.C.section 360bbb-3(b)(1), unless the authorization is terminated  or revoked sooner.       Influenza A by PCR NEGATIVE NEGATIVE Final   Influenza B by PCR NEGATIVE NEGATIVE Final    Comment: (NOTE) The Xpert Xpress SARS-CoV-2/FLU/RSV plus assay is intended as an aid in the diagnosis of influenza from Nasopharyngeal swab specimens and should not be used as a sole basis  for treatment. Nasal washings and aspirates are unacceptable for Xpert Xpress SARS-CoV-2/FLU/RSV testing.  Fact Sheet for Patients: BloggerCourse.com  Fact Sheet for Healthcare Providers: SeriousBroker.it  This test is not yet approved or cleared by the [place] FDA and has been authorized for detection and/or diagnosis of SARS-CoV-2 by FDA under an Emergency Use Authorization (EUA). This EUA will remain in effect (meaning this test can be used) for the duration of the COVID-19 declaration under Section 564(b)(1) of the Act, 21 U.S.C. section 360bbb-3(b)(1), unless the authorization is terminated or revoked.  Performed at [facility], [address] [place], [place] [address]   MRSA PCR Screening     Status: None   Collection Time: 09/05/20  8:00 PM   Specimen: Nasopharyngeal  Result Value Ref Range Status   MRSA by PCR NEGATIVE NEGATIVE Final    Comment:        The GeneXpert MRSA Assay (FDA approved for NASAL specimens only), is one component of a comprehensive MRSA colonization surveillance program. It is not intended to diagnose MRSA infection nor to guide or monitor treatment for MRSA infections. Performed at [facility], [address] [place], [place] [address]     Procedures and diagnostic studies:  No results found.             LOS: 5 days   Suezette Lafave  [facility]   Pager on [email or web address]. If 7PM-7AM, please contact night-coverage at [email or web address]     09/10/2020, 11:40 AM

## 2020-09-10 NOTE — Progress Notes (Signed)
Occupational Therapy Treatment Patient Details Name: Darryl Sullivan MRN: [id number] DOB: 22-Dec-1965 Today's Date: 09/10/2020    History of present illness Darryl Sullivan is a 55 year old male with a past medical history significant for tobacco abuse who presents to [facility] ED on 09/05/2020 due to complaints of left-sided weakness and presyncope.      He reports that at work this morning he developed acute onset of left arm and leg weakness at approximately 07:00 a.m.  He denies facial asymmetry, slurred speech, headache, chest pain, palpitations, shortness of breath, cough, neck pain, back pain, dysuria.  Denies any history of stroke, high blood pressure, diabetes mellitus, alcohol or illicit drug abuse.  He is now on any blood thinners. CT shows small areas of ischemia in R basal ganglia and L temporal white matter.   OT comments  Pt seen for OT treatment this date to f/u re: fine motor coordination of L UE. OT issues pt fine motor coordination exercise handout and goes over the top 4 exercises with pt including demonstration and verbal explanation. In addition, OT engages pt in trials as follows: digit opposition, abd/add, and flex/ext as well as wrist flexion/ext PROM for 2 set x10 reps each. Pt able to engage in [facility] for elbow flexion (<1/4 range, but still a meaningful progression), and shld flexion to 1/3 range (OT stabilizing for subluxation prevention) AAROM for 2 set x10 reps of each. Pt tolerates well and demos good understanding of PROM using R hand as well as attempting AROM to improve motor-neuron pathways. Pt continues to benefit from skilled occupational therapy and continue to anticipate that CIR would be most beneficial d/c placement to restore function to highest attainable level/closest to pt's PLOF.    Follow Up Recommendations  CIR    Equipment Recommendations  3 in 1 bedside commode    Recommendations for Other Services      Precautions / Restrictions Precautions Precautions:  Fall Restrictions Weight Bearing Restrictions: No       Mobility Bed Mobility            Transfers              Balance                  ADL either performed or assessed with clinical judgement   ADL                 Vision Baseline Vision/History: No visual deficits Patient Visual Report: No change from baseline     Perception     Praxis      Cognition Arousal/Alertness: Awake/alert Behavior During Therapy: WFL for tasks assessed/performed Overall Cognitive Status: Within Functional Limits for tasks assessed                        Exercises Other Exercises Other Exercises: OT engages pt in digit opposition, abd/add, and flex/ext as well as wrist flexion/ext PROM for 2 set x10 reps each. Pt able to engage in [facility] for elbow flexion (<1/4 range, but still a meaningful progression), and shld flexion (OT stabilizing for subluxation prevention) AAROM for 2 set x10 reps of each.   Shoulder Instructions       General Comments      Pertinent Vitals/ Pain       Pain Assessment: No/denies pain  Home Living                    Prior Functioning/Environment  Frequency  Min 3X/week        Progress Toward Goals  OT Goals(current goals can now be found in the care plan section)  Progress towards OT goals: Progressing toward goals  Acute Rehab OT Goals Patient Stated Goal: to get better OT Goal Formulation: With patient Time For Goal Achievement: 09/20/20 Potential to Achieve Goals: Good  Plan Discharge plan remains appropriate;Frequency remains appropriate    Co-evaluation                 AM-PAC OT "6 Clicks" Daily Activity     Outcome Measure   Help from another person eating meals?: None Help from another person taking care of personal grooming?: A Little Help from another person toileting, which includes using toliet, bedpan, or urinal?: A Lot Help from another person bathing (including  washing, rinsing, drying)?: A Lot Help from another person to put on and taking off regular upper body clothing?: A Lot Help from another person to put on and taking off regular lower body clothing?: A Lot 6 Click Score: 15    End of Session    OT Visit Diagnosis: Other abnormalities of gait and mobility (R26.89);Hemiplegia and hemiparesis Hemiplegia - Right/Left: Left Hemiplegia - dominant/non-dominant: Non-Dominant Hemiplegia - caused by: Cerebral infarction   Activity Tolerance Patient tolerated treatment well   Patient Left with call bell/phone within reach;in chair   Nurse Communication          Time: 6294-7654 OT Time Calculation (min): 24 min  Charges: OT General Charges $OT Visit: 1 Visit OT Treatments $Therapeutic Activity: 23-37 mins  Rejeana Brock, MS, OTR/L ascom [phone] 09/10/20, 7:37 PM

## 2020-09-10 NOTE — Progress Notes (Signed)
Physical Therapy Treatment Patient Details Name: Darryl Sullivan MRN: [id number] DOB: Jun 09, 1966 Today's Date: 09/10/2020    History of Present Illness Darryl Sullivan is a 55 year old male with a past medical history significant for tobacco abuse who presents to [facility] ED on 09/05/2020 due to complaints of left-sided weakness and presyncope.      He reports that at work this morning he developed acute onset of left arm and leg weakness at approximately 07:00 a.m.  He denies facial asymmetry, slurred speech, headache, chest pain, palpitations, shortness of breath, cough, neck pain, back pain, dysuria.  Denies any history of stroke, high blood pressure, diabetes mellitus, alcohol or illicit drug abuse.  He is now on any blood thinners. CT shows small areas of ischemia in R basal ganglia and L temporal white matter.    PT Comments    Pt was long sitting in bed watching TV upon arriving. He is A and O x 4 and extremely motivated to improve. He was able to demonstrate AROM in both LUE/LLE however very minimal in LUE. Poor coordination/strength. Was able to exit L side of flat bed with log roll technique and heavy use of bed rails. Sat EOB without difficulty or LOB x ~ 5 minutes. Author educated pt on importance of movements for LUE/LLE to promote functional return.He states understanding and demonstrated several exercises. Pt was able to stand to hemi walker and ambulate with mod assist around room to recliner. Pt struggles with LLE advancement and placement. Required assist to protect L knee from buckling during RLE advancement. Overall pt tolerated session well. He is a perfect CIR candidate and will continue to benefit from skilled PT to assist with return to PLOF.    Follow Up Recommendations  CIR     Equipment Recommendations  Other (comment) (defer to next level of care)       Precautions / Restrictions Precautions Precautions: Fall Restrictions Weight Bearing Restrictions: No    Mobility  Bed  Mobility Overal bed mobility: Needs Assistance Bed Mobility: Supine to Sit;Rolling;Sidelying to Sit Rolling:  (flat bed surface, rolled L to short sit with min assist) Sidelying to sit: Min assist Supine to sit: Min assist     General bed mobility comments: Increased time with vcs for sequencing and safety. heavy use of bedrail to achieve EOB short sit.    Transfers Overall transfer level: Needs assistance Equipment used: Hemi-walker Transfers: Sit to/from Stand Sit to Stand: Min assist         General transfer comment: Min assist to stand from slightly elevated bed height. Vcs for handplacement and technique.  Ambulation/Gait Ambulation/Gait assistance: Mod assist Gait Distance (Feet): 15 Feet Assistive device: Hemi-walker Gait Pattern/deviations: Step-to pattern;Decreased step length - left;Decreased step length - right;Shuffle;Narrow base of support Gait velocity: decreased   General Gait Details: Pt was able to exit L side of bed and ambulate around room to recliner with hemi walker. does require mod assist for LLE placement. pt tends to have LLE cross midline. constant L knee blocking during RLE advancement for safety.       Balance Overall balance assessment: Needs assistance Sitting-balance support: Feet supported;Single extremity supported Sitting balance-Leahy Scale: Good Sitting balance - Comments: Pt sat EOB x ~ 5 minutes without assistance.   Standing balance support: Single extremity supported;During functional activity Standing balance-Leahy Scale: Poor Standing balance comment: pt is at high fall risk due to deficits       Cognition Arousal/Alertness: Awake/alert Behavior During Therapy: [facility] for tasks  assessed/performed Overall Cognitive Status: Within Functional Limits for tasks assessed        General Comments: Pt is A and O x 4. motivated and agreeable for CIR         General Comments General comments (skin integrity, edema, etc.): Therapist  issued ther ex to promote return in LUE/LLE. he does have some AROM in both but less in LUE.      Pertinent Vitals/Pain Pain Assessment: No/denies pain Faces Pain Scale: No hurt Pain Intervention(s): Monitored during session;Limited activity within patient's tolerance           PT Goals (current goals can now be found in the care plan section) Acute Rehab PT Goals Patient Stated Goal: to get better Progress towards PT goals: Progressing toward goals    Frequency    7X/week      PT Plan Current plan remains appropriate       AM-PAC PT "6 Clicks" Mobility   Outcome Measure  Help needed turning from your back to your side while in a flat bed without using bedrails?: A Little Help needed moving from lying on your back to sitting on the side of a flat bed without using bedrails?: A Little Help needed moving to and from a bed to a chair (including a wheelchair)?: A Little Help needed standing up from a chair using your arms (e.g., wheelchair or bedside chair)?: A Little Help needed to walk in hospital room?: A Lot Help needed climbing 3-5 steps with a railing? : A Lot 6 Click Score: 16    End of Session Equipment Utilized During Treatment: Gait belt Activity Tolerance: Patient tolerated treatment well Patient left: in chair;with call bell/phone within reach;with chair alarm set Nurse Communication: Mobility status PT Visit Diagnosis: Unsteadiness on feet (R26.81);Muscle weakness (generalized) (M62.81);Difficulty in walking, not elsewhere classified (R26.2);Hemiplegia and hemiparesis;Other abnormalities of gait and mobility (R26.89) Hemiplegia - Right/Left: Left Hemiplegia - dominant/non-dominant: Non-dominant Hemiplegia - caused by: Cerebral infarction     Time: 0920-0944 PT Time Calculation (min) (ACUTE ONLY): 24 min  Charges:  $Gait Training: 8-22 mins $Neuromuscular Re-education: 8-22 mins                     Jetta Lout PTA 09/10/20, 9:55 AM

## 2020-09-10 NOTE — Progress Notes (Signed)
Neurology Progress Note  Patient ID: Darryl Sullivan is a 55 y.o. with PMHx of stroke risk factors of obesity and smoking, and limited interface with medical care presenting with likely lacunar stroke, within the tPA window.  Given potential for stuttering lacunar stroke with worsening of symptoms once outside of the tPA window, decision was made to proceed with tPA.    Subjective: - Speech and L sided weakness gradually improving each day - Denies headache or any other acute complaints - Awaiting discharge to acute rehab  Interval data:  Repeat [facility] 09/08/20 Stable punctate left basal ganglia hemorrhage. Evolving recent infarction of right basal ganglia and adjacent white Matter.  CNS imaging personally reviewed  Prior data: TTE 09/05/20: LVEF 60-65%, grade I diastolic dysfunction, no significant valvular abnl, no visualized intracardiac clot  Exam: Current vital signs: BP (!) 152/96 (BP Location: Left Arm)   Pulse 89   Temp 98.7 F (37.1 C)   Resp 16   Ht 5\' 7"  (1.702 m)   Wt 108.2 kg   SpO2 97%   BMI 37.35 kg/m  Vital signs in last 24 hours: Temp:  [98.5 F (36.9 C)-98.7 F (37.1 C)] 98.7 F (37.1 C) (04/02 1538) Pulse Rate:  [82-93] 89 (04/02 1538) Resp:  [16] 16 (04/02 1538) BP: (131-154)/(84-98) 152/96 (04/02 1538) SpO2:  [92 %-98 %] 97 % (04/02 1538)  Gen: In bed, comfortable  Resp: non-labored breathing, no grossly audible wheezing Cardiac: Perfusing extremities well  Abd: soft, nt  Neuro: MS: Alert, awake, oriented.  No longer has any difficulty with repetition, naming intact.  Follows commands easily. CN: Visual fields intact to confrontation, face symmetric, tongue midline, mild left nasolabial fold flattening baseline per patient, mild dysarthria though he reports this is his baseline speech Motor: Left upper extremity is 2/5 distally 3/5 proxt.  Left lower extremity is 4/5 at the hip Sensory: Intact to light touch throughout  Pertinent Data: Cr increase  from 1.3 to 1.43   Lab Results  Component Value Date   CHOL 238 (H) 09/06/2020   HDL 38 (L) 09/06/2020   LDLCALC 165 (H) 09/06/2020   TRIG 174 (H) 09/06/2020   CHOLHDL 6.3 09/06/2020   Lab Results  Component Value Date   HGBA1C 4.7 (L) 09/06/2020  However notably his fasting glucose this morning was 113  Lab Results  Component Value Date   TSH 0.608 09/06/2020    No results found for: RPR  No results found for: HIV1X2  Impression: This is a 55 year old male with a past medical history significant for obesity, smoking, hyperlipidemia (newly diagnosed), impaired fasting blood glucose (though A1c is 4.7%), likely uncontrolled hypertension.  Strokes overall appear to be related to small vessel disease however the right corona radiata lesion is big enough and given the pattern of multiple vascular distributions involved, patient will need event monitor on discharge since echo did not show intracardiac thrombus.  CT showed small hemorrhagic conversion but patient has been clinically stable.  The small hemorrhage would be expected to cause right-sided symptoms including the initial mild aphasia on my admission examination (which has since improved). No role for reversal at this time given clinical and radiographic stability of the hemorrhagic lesion but will need continued close monitoring and strict blood pressure control as below  ICH score 09/06/20 was 0  Repeat CT head 09/08/20 shows stability of small hemorrhagic transformation  Recommendations:  # Multifocal lacunar strokes versus potentially cardioembolic strokes # Minor hemorrhagic conversion of left sub-insular stroke stroke  after tPA administration - Cont ASA 81mg  daily - Cont pharmacologic DVT prophylaxis enoxaparin - SBP goal normotension -Risk factor modification counseling, patient counseled on diet, exercise, smoking cessation, need for outpatient follow-up including sleep study, medication compliance (new diagnosis of  hyperlipidemia and impaired fasting blood glucose) -Outpatient sleep study given snoring at night and potential for sleep apnea based on body habitus -Patient awaiting discharge to acute rehab - Amb cardiac monitoring after discharge  # Hyperlipidemia, new diagnosis -Initiation of atorvastatin 80 mg nightly  # Impaired fasting blood glucose with low-normal A1c -Outpatient PCP follow-up  Patient is doing extremely well. No further inpatient neurologic w/u required. Awaiting discharge to acute rehab. I have placed an order for amb referral to neurology to arrange o/p neurology f/u to include amb cardiac monitoring.  Neurology will not continue to actively follow but please re-engage if additional questions arise.  , MD [facility] [phone]  If 7pm- 7am, please page neurology on call as listed in AMION.

## 2020-09-10 NOTE — Plan of Care (Signed)
  Problem: Education: Goal: Knowledge of General Education information will improve Description: Including pain rating scale, medication(s)/side effects and non-pharmacologic comfort measures Outcome: Progressing   Problem: Health Behavior/Discharge Planning: Goal: Ability to manage health-related needs will improve Outcome: Progressing   Problem: Clinical Measurements: Goal: Ability to maintain clinical measurements within normal limits will improve Outcome: Progressing Goal: Will remain free from infection Outcome: Progressing Goal: Diagnostic test results will improve Outcome: Progressing Goal: Respiratory complications will improve Outcome: Progressing Goal: Cardiovascular complication will be avoided Outcome: Progressing   Problem: Activity: Goal: Risk for activity intolerance will decrease Outcome: Progressing  PT able to transfer pt oob to chair Problem: Nutrition: Goal: Adequate nutrition will be maintained Outcome: Progressing   Problem: Coping: Goal: Level of anxiety will decrease Outcome: Progressing   Problem: Elimination: Goal: Will not experience complications related to bowel motility Outcome: Progressing Goal: Will not experience complications related to urinary retention Outcome: Progressing   Problem: Pain Managment: Goal: General experience of comfort will improve Outcome: Progressing   Problem: Safety: Goal: Ability to remain free from injury will improve Outcome: Progressing   Problem: Skin Integrity: Goal: Risk for impaired skin integrity will decrease Outcome: Progressing   Problem: Education: Goal: Knowledge of disease or condition will improve Outcome: Progressing Goal: Knowledge of secondary prevention will improve Outcome: Progressing Goal: Knowledge of patient specific risk factors addressed and post discharge goals established will improve Outcome: Progressing   Problem: Coping: Goal: Will verbalize positive feelings about  self Outcome: Progressing Goal: Will identify appropriate support needs Outcome: Progressing   Problem: Health Behavior/Discharge Planning: Goal: Ability to manage health-related needs will improve Outcome: Progressing   Problem: Self-Care: Goal: Ability to participate in self-care as condition permits will improve Outcome: Progressing Goal: Verbalization of feelings and concerns over difficulty with self-care will improve Outcome: Progressing Goal: Ability to communicate needs accurately will improve Outcome: Progressing   Problem: Nutrition: Goal: Risk of aspiration will decrease Outcome: Progressing   Problem: Intracerebral Hemorrhage Tissue Perfusion: Goal: Complications of Intracerebral Hemorrhage will be minimized Outcome: Progressing

## 2020-09-11 NOTE — Progress Notes (Signed)
Progress Note    Darryl Sullivan  BWI:[id number] DOB: 12/22/1965  DOA: 09/05/2020 PCP: Patient, No Pcp Per (Inactive)      Brief Narrative:    Medical records reviewed and are as summarized below:  Darryl Sullivan is a 55 y.o. male with medical history significant for obesity, tobacco use disorder, hypertension, who was brought to the hospital because of left-sided weakness and presyncope.[name]  He was found to have acute right basal ganglia and left temporal white matter infarct/stroke.  He had mild aphasia on admission.  He was given TPA      Assessment/Plan:   Principal Problem:   Cerebrovascular accident (CVA) [facility]) Active Problems:   Hypertensive emergency    Body mass index is 37.35 kg/m.  (Obesity)   Acute stroke s/p TPA with a small hemorrhagic conversion: Repeat CT head today shows stability of punctate left basal ganglia hemorrhage.  Continue aspirin and Lipitor.   Continue PT and OT. Hemoglobin A1c is 4.7. Ambulatory cardiac monitor recommended at discharge.  Hyperlipidemia: Continue Lipitor  Hypertension: Consider antihypertensives  CKD stage IIIa: Creatinine is stable  Obesity: Outpatient sleep study recommended for evaluation of OSA.  Awaiting placement to inpatient rehab.   Diet Order            Diet Heart Room service appropriate? Yes; Fluid consistency: Thin  Diet effective now                    Consultants:  Neurologist  Procedures:  None    Medications:   . amLODipine  10 mg Oral Daily  . aspirin EC  81 mg Oral Daily  . atorvastatin  80 mg Oral QHS  . Chlorhexidine Gluconate Cloth  6 each Topical Daily  . enoxaparin (LOVENOX) injection  40 mg Subcutaneous Q24H  . hydrochlorothiazide  25 mg Oral Daily  . mouth rinse  15 mL Mouth Rinse BID  . pantoprazole  40 mg Oral QHS   Continuous Infusions:    Anti-infectives (From admission, onward)   None             Family Communication/Anticipated D/C date and  plan/Code Status   DVT prophylaxis: enoxaparin (LOVENOX) injection 40 mg Start: 09/08/20 2200 Place and maintain sequential compression device Start: 09/06/20 1128 SCD's Start: 09/05/20 1057     Code Status: Full Code  Family Communication: None Disposition Plan:    Status is: Inpatient  Remains inpatient appropriate because:Unsafe d/c plan and Inpatient level of care appropriate due to severity of illness   Dispo: The patient is from: Home              Anticipated d/c is to: Home              Patient currently is not medically stable to d/c.   Difficult to place patient No           Subjective:   No new complaints.  He still has weakness in the left upper and lower extremities.  Objective:    Vitals:   09/10/20 2355 09/11/20 0422 09/11/20 0725 09/11/20 1222  BP: (!) 139/94 (!) 145/99 137/81 (!) 141/84  Pulse: 85 87 82 99  Resp: 16 16 16 16   Temp: 98.1 F (36.7 C) 98.2 F (36.8 C) 97.9 F (36.6 C) 98.9 F (37.2 C)  TempSrc:   Oral   SpO2: 98% 98% 98% 98%  Weight:      Height:       No  data found.   Intake/Output Summary (Last 24 hours) at 09/11/2020 1429 Last data filed at 09/11/2020 1412 Gross per 24 hour  Intake 360 ml  Output 850 ml  Net -490 ml   Filed Weights   09/05/20 0935 09/05/20 1006  Weight: 103 kg 108.2 kg    Exam:  GEN: NAD SKIN: Warm and dry EYES: EOMI ENT: MMM CV: RRR PULM: CTA B ABD: soft, ND, NT, +BS CNS: AAO x 3, left facial droop, mild slurred speech, power is 3/5 in both left upper and lower extremities EXT: No edema or tenderness       Data Reviewed:   I have personally reviewed following labs and imaging studies:  Labs: Labs show the following:   Basic Metabolic Panel: Recent Labs  Lab 09/05/20 0937 09/05/20 0955 09/06/20 0511 09/07/20 0543 09/08/20 0551 09/10/20 0413  NA 137  --  139 137 139 137  K 4.2  --  3.5 3.5 3.7 3.6  CL 107  --  106 105 106 101  CO2 23  --  25 25 24 26   GLUCOSE 115*  --   113* 126* 119* 106*  BUN 21*  --  22* 24* 28* 36*  CREATININE 1.31* 1.30* 1.43* 1.26* 1.37* 1.42*  CALCIUM 9.4  --  9.6 9.4 9.4 9.8  MG  --   --  2.0  --   --   --   PHOS  --   --  3.9  --   --   --    GFR Estimated Creatinine Clearance: 68.9 mL/min (A) (by C-G formula based on SCr of 1.42 mg/dL (H)). Liver Function Tests: Recent Labs  Lab 09/05/20 0937  AST 23  ALT 32  ALKPHOS 58  BILITOT 0.9  PROT 7.5  ALBUMIN 4.4   No results for input(s): LIPASE, AMYLASE in the last 168 hours. No results for input(s): AMMONIA in the last 168 hours. Coagulation profile Recent Labs  Lab 09/05/20 0937 09/06/20 0511  INR 1.1 1.2    CBC: Recent Labs  Lab 09/05/20 0937 09/06/20 0511 09/07/20 0543 09/08/20 0551  WBC 7.2 8.1 8.4 6.7  NEUTROABS 5.2  --   --   --   HGB 13.7 13.8 14.2 13.7  HCT 43.2 43.9 44.3 43.1  MCV 89.4 88.9 87.9 89.6  PLT 194 191 189 179   Cardiac Enzymes: No results for input(s): CKTOTAL, CKMB, CKMBINDEX, TROPONINI in the last 168 hours. BNP (last 3 results) No results for input(s): PROBNP in the last 8760 hours. CBG: No results for input(s): GLUCAP in the last 168 hours. D-Dimer: No results for input(s): DDIMER in the last 72 hours. Hgb A1c: No results for input(s): HGBA1C in the last 72 hours. Lipid Profile: No results for input(s): CHOL, HDL, LDLCALC, TRIG, CHOLHDL, LDLDIRECT in the last 72 hours. Thyroid function studies: No results for input(s): TSH, T4TOTAL, T3FREE, THYROIDAB in the last 72 hours.  Invalid input(s): FREET3 Anemia work up: No results for input(s): VITAMINB12, FOLATE, FERRITIN, TIBC, IRON, RETICCTPCT in the last 72 hours. Sepsis Labs: Recent Labs  Lab 09/05/20 0937 09/06/20 0511 09/07/20 0543 09/08/20 0551  WBC 7.2 8.1 8.4 6.7    Microbiology Recent Results (from the past 240 hour(s))  Resp Panel by RT-PCR (Flu A&B, Covid) Nasopharyngeal Swab     Status: None   Collection Time: 09/05/20  9:37 AM   Specimen: Nasopharyngeal  Swab; Nasopharyngeal(NP) swabs in vial transport medium  Result Value Ref Range Status   SARS Coronavirus 2  by RT PCR NEGATIVE NEGATIVE Final    Comment: (NOTE) SARS-CoV-2 target nucleic acids are NOT DETECTED.  The SARS-CoV-2 RNA is generally detectable in upper respiratory specimens during the acute phase of infection. The lowest concentration of SARS-CoV-2 viral copies this assay can detect is 138 copies/mL. A negative result does not preclude SARS-Cov-2 infection and should not be used as the sole basis for treatment or other patient management decisions. A negative result may occur with  improper specimen collection/handling, submission of specimen other than nasopharyngeal swab, presence of viral mutation(s) within the areas targeted by this assay, and inadequate number of viral copies(<138 copies/mL). A negative result must be combined with clinical observations, patient history, and epidemiological information. The expected result is Negative.  Fact Sheet for Patients:  BloggerCourse.com  Fact Sheet for Healthcare Providers:  SeriousBroker.it  This test is no t yet approved or cleared by the [place] FDA and  has been authorized for detection and/or diagnosis of SARS-CoV-2 by FDA under an Emergency Use Authorization (EUA). This EUA will remain  in effect (meaning this test can be used) for the duration of the COVID-19 declaration under Section 564(b)(1) of the Act, 21 U.S.C.section 360bbb-3(b)(1), unless the authorization is terminated  or revoked sooner.       Influenza A by PCR NEGATIVE NEGATIVE Final   Influenza B by PCR NEGATIVE NEGATIVE Final    Comment: (NOTE) The Xpert Xpress SARS-CoV-2/FLU/RSV plus assay is intended as an aid in the diagnosis of influenza from Nasopharyngeal swab specimens and should not be used as a sole basis for treatment. Nasal washings and aspirates are unacceptable for Xpert Xpress  SARS-CoV-2/FLU/RSV testing.  Fact Sheet for Patients: BloggerCourse.com  Fact Sheet for Healthcare Providers: SeriousBroker.it  This test is not yet approved or cleared by the [place] FDA and has been authorized for detection and/or diagnosis of SARS-CoV-2 by FDA under an Emergency Use Authorization (EUA). This EUA will remain in effect (meaning this test can be used) for the duration of the COVID-19 declaration under Section 564(b)(1) of the Act, 21 U.S.C. section 360bbb-3(b)(1), unless the authorization is terminated or revoked.  Performed at [facility], [address] [place], [place] [address]   MRSA PCR Screening     Status: None   Collection Time: 09/05/20  8:00 PM   Specimen: Nasopharyngeal  Result Value Ref Range Status   MRSA by PCR NEGATIVE NEGATIVE Final    Comment:        The GeneXpert MRSA Assay (FDA approved for NASAL specimens only), is one component of a comprehensive MRSA colonization surveillance program. It is not intended to diagnose MRSA infection nor to guide or monitor treatment for MRSA infections. Performed at [facility], [address] [place], [place] [address]     Procedures and diagnostic studies:  No results found.             LOS: 6 days   Delwin Raczkowski  [facility]   Pager on [email or web address]. If 7PM-7AM, please contact night-coverage at [email or web address]     09/11/2020, 2:29 PM

## 2020-09-11 NOTE — Progress Notes (Addendum)
Physical Therapy Treatment Patient Details Name: Darryl Sullivan MRN: [id number] DOB: [birth year] Today's Date: 09/11/2020    History of Present Illness Tetsuo Coppola is a 55 year old male with a past medical history significant for tobacco abuse who presents to [facility] ED on 09/05/2020 due to complaints of left-sided weakness and presyncope.      He reports that at work this morning he developed acute onset of left arm and leg weakness at approximately 07:00 a.m.  He denies facial asymmetry, slurred speech, headache, chest pain, palpitations, shortness of breath, cough, neck pain, back pain, dysuria.  Denies any history of stroke, high blood pressure, diabetes mellitus, alcohol or illicit drug abuse.  He is now on any blood thinners. CT shows small areas of ischemia in R basal ganglia and L temporal white matter.    PT Comments    Pt in bed upon entry watching TV, agreeable to session. Pt agreeable to trial of AFO this date as he has been requiring manual assist from PT on prior days for prevention of knee buckling. Pt educated on ergonomic value of AFO in gait and safety utility. Pt able to AMB 4 94ft intervals in session with HW on Rt, he is an excellent student in learning optimal HW technique and safe use. AFO does prove successful in facilitating safety and energy efficiency, the patient really showing improvements in gait speed with each interval. Distance is only limited by onset of tachycardia in 140s with concurrent dizziness, after which he takes a 3 minutes seated rest with rate recovery to low 100s BPM and resolution of dizziness. Pt left up in chair at end of session. RN made aware.     Pt has big increase in independence and tolerance to AMB with use of rigid AFO, however trial device lacks adequate rigidity for optimal knee control, and performance/safety could be further enhanced by a custom device in the future.  *Addendum 09/12/20: AFO rigidity appears adequate; suspect heel not fully in shoe  this date due to calf hypertonicity and inadequate tightness or shoe laces; rectified with success next day.    Follow Up Recommendations  CIR     Equipment Recommendations  Other (comment)    Recommendations for Other Services Rehab consult     Precautions / Restrictions Precautions Precautions: Fall Precaution Comments: Left foot drop c spastic calf    Mobility  Bed Mobility Overal bed mobility: Needs Assistance Bed Mobility: Sidelying to Sit     Supine to sit: Min assist          Transfers Overall transfer level: Needs assistance Equipment used: Hemi-walker Transfers: Sit to/from Stand Sit to Stand: Min assist         General transfer comment: provided minA repeatedly to allow most energy reseveres for AMB practice  Ambulation/Gait   Gait Distance (Feet): 13 Feet (13x4; distance limited each time by tachycardia and dizziness) Assistive device: Hemi-walker (pt's shoes, Left rigid AFO) Gait Pattern/deviations: Step-to pattern     General Gait Details: 3-point step-to gait, good demonstration of maintaining HW at 1:30 position with FWD and lateral lean to facilitate LLE swing. Left knee remains flexed, however AFO trial is successful in preventing all out buckling during stance phase. Pt gets dizzy each time as HR jumps to 140s BPM;   Stairs             Wheelchair Mobility    Modified Rankin (Stroke Patients Only)       Balance  Cognition Arousal/Alertness: Awake/alert Behavior During Therapy: WFL for tasks assessed/performed Overall Cognitive Status: Within Functional Limits for tasks assessed                                 General Comments: Pt is A and O x 4. motivated and agreeable for CIR      Exercises Other Exercises Other Exercises: 42ft AMB, 3 minutes rest; 67ft AMB, 3 minutes rest; 39ft AMB, 3 minutes rest; 96ft AMB, 3 minutes rest;    General Comments         Pertinent Vitals/Pain Pain Assessment: No/denies pain    Home Living                      Prior Function            PT Goals (current goals can now be found in the care plan section) Acute Rehab PT Goals Patient Stated Goal: to get better PT Goal Formulation: With patient Time For Goal Achievement: 09/20/20 Potential to Achieve Goals: Good Progress towards PT goals: Progressing toward goals    Frequency    7X/week      PT Plan Current plan remains appropriate    Co-evaluation              AM-PAC PT "6 Clicks" Mobility   Outcome Measure  Help needed turning from your back to your side while in a flat bed without using bedrails?: A Little Help needed moving from lying on your back to sitting on the side of a flat bed without using bedrails?: A Little Help needed moving to and from a bed to a chair (including a wheelchair)?: A Little Help needed standing up from a chair using your arms (e.g., wheelchair or bedside chair)?: A Little Help needed to walk in hospital room?: A Lot Help needed climbing 3-5 steps with a railing? : A Lot 6 Click Score: 16    End of Session Equipment Utilized During Treatment: Gait belt (Boots x2; trial AFO Left) Activity Tolerance: Patient tolerated treatment well;Treatment limited secondary to medical complications (Comment) Patient left: in chair;with call bell/phone within reach Nurse Communication: Mobility status PT Visit Diagnosis: Unsteadiness on feet (R26.81);Muscle weakness (generalized) (M62.81);Difficulty in walking, not elsewhere classified (R26.2);Hemiplegia and hemiparesis;Other abnormalities of gait and mobility (R26.89) Hemiplegia - Right/Left: Left Hemiplegia - dominant/non-dominant: Non-dominant Hemiplegia - caused by: Cerebral infarction     Time: 7948-0165 PT Time Calculation (min) (ACUTE ONLY): 60 min  Charges:  $Gait Training: 23-37 mins $Neuromuscular Re-education: 23-37 mins                      2:39 PM, 09/11/20 Rosamaria Lints, PT, DPT Physical Therapist - [facility]  [phone] (ASCOM)    Drianna Chandran C 09/11/2020, 2:35 PM  Addendum on 09/12/20 as above to add comments regarding AFO user error.  6:55 PM, 09/12/20 Rosamaria Lints, PT, DPT Physical Therapist - [facility]  [phone] [facility])

## 2020-09-11 NOTE — Plan of Care (Signed)
  Problem: Education: Goal: Knowledge of General Education information will improve Description: Including pain rating scale, medication(s)/side effects and non-pharmacologic comfort measures Outcome: Progressing   Problem: Health Behavior/Discharge Planning: Goal: Ability to manage health-related needs will improve Outcome: Progressing   Problem: Clinical Measurements: Goal: Ability to maintain clinical measurements within normal limits will improve Outcome: Progressing Goal: Will remain free from infection Outcome: Progressing Goal: Diagnostic test results will improve Outcome: Progressing Goal: Respiratory complications will improve Outcome: Progressing Goal: Cardiovascular complication will be avoided Outcome: Progressing   Problem: Activity: Goal: Risk for activity intolerance will decrease Outcome: Progressing   Problem: Nutrition: Goal: Adequate nutrition will be maintained Outcome: Progressing   Problem: Coping: Goal: Level of anxiety will decrease Outcome: Progressing   Problem: Elimination: Goal: Will not experience complications related to bowel motility Outcome: Progressing Goal: Will not experience complications related to urinary retention Outcome: Progressing   Problem: Pain Managment: Goal: General experience of comfort will improve Outcome: Progressing   Problem: Safety: Goal: Ability to remain free from injury will improve Outcome: Progressing   Problem: Skin Integrity: Goal: Risk for impaired skin integrity will decrease Outcome: Progressing   Problem: Education: Goal: Knowledge of disease or condition will improve Outcome: Progressing Goal: Knowledge of secondary prevention will improve Outcome: Progressing Goal: Knowledge of patient specific risk factors addressed and post discharge goals established will improve Outcome: Progressing   Problem: Coping: Goal: Will verbalize positive feelings about self Outcome: Progressing Goal: Will  identify appropriate support needs Outcome: Progressing   Problem: Health Behavior/Discharge Planning: Goal: Ability to manage health-related needs will improve Outcome: Progressing   Problem: Self-Care: Goal: Ability to participate in self-care as condition permits will improve Outcome: Progressing Goal: Verbalization of feelings and concerns over difficulty with self-care will improve Outcome: Progressing Goal: Ability to communicate needs accurately will improve Outcome: Progressing   Problem: Nutrition: Goal: Risk of aspiration will decrease Outcome: Progressing   Problem: Intracerebral Hemorrhage Tissue Perfusion: Goal: Complications of Intracerebral Hemorrhage will be minimized Outcome: Progressing  Patient slept comfortably throughout the night. VSS. No medical complaints at this time.

## 2020-09-11 NOTE — Plan of Care (Signed)
  Problem: Education: Goal: Knowledge of General Education information will improve Description: Including pain rating scale, medication(s)/side effects and non-pharmacologic comfort measures Outcome: Progressing   Problem: Health Behavior/Discharge Planning: Goal: Ability to manage health-related needs will improve Outcome: Progressing   Problem: Clinical Measurements: Goal: Ability to maintain clinical measurements within normal limits will improve Outcome: Progressing Goal: Will remain free from infection Outcome: Progressing Goal: Diagnostic test results will improve Outcome: Progressing Goal: Respiratory complications will improve Outcome: Progressing Goal: Cardiovascular complication will be avoided Outcome: Progressing   Problem: Activity: Goal: Risk for activity intolerance will decrease Outcome: Progressing  Pt with left sided weakness; pt performing exercises throughout shift Problem: Nutrition: Goal: Adequate nutrition will be maintained Outcome: Progressing   Problem: Coping: Goal: Level of anxiety will decrease Outcome: Progressing   Problem: Elimination: Goal: Will not experience complications related to bowel motility Outcome: Progressing Goal: Will not experience complications related to urinary retention Outcome: Progressing   Problem: Pain Managment: Goal: General experience of comfort will improve Outcome: Progressing   Problem: Safety: Goal: Ability to remain free from injury will improve Outcome: Progressing   Problem: Skin Integrity: Goal: Risk for impaired skin integrity will decrease Outcome: Progressing   Problem: Education: Goal: Knowledge of disease or condition will improve Outcome: Progressing Goal: Knowledge of secondary prevention will improve Outcome: Progressing Goal: Knowledge of patient specific risk factors addressed and post discharge goals established will improve Outcome: Progressing   Problem: Coping: Goal: Will  verbalize positive feelings about self Outcome: Progressing Goal: Will identify appropriate support needs Outcome: Progressing   Problem: Health Behavior/Discharge Planning: Goal: Ability to manage health-related needs will improve Outcome: Progressing   Problem: Self-Care: Goal: Ability to participate in self-care as condition permits will improve Outcome: Progressing Goal: Verbalization of feelings and concerns over difficulty with self-care will improve Outcome: Progressing Goal: Ability to communicate needs accurately will improve Outcome: Progressing   Problem: Nutrition: Goal: Risk of aspiration will decrease Outcome: Progressing   Problem: Intracerebral Hemorrhage Tissue Perfusion: Goal: Complications of Intracerebral Hemorrhage will be minimized Outcome: Progressing

## 2020-09-12 NOTE — Progress Notes (Signed)
Inpatient Rehab Admissions Coordinator:   Awaiting determination from [facility] regarding prior auth for CIR.  Will continue to follow.   Estill Dooms, PT, DPT Admissions Coordinator [phone] 09/12/20  11:07 AM

## 2020-09-12 NOTE — Progress Notes (Signed)
Physical Therapy Treatment Patient Details Name: Darryl Sullivan MRN: [id number] DOB: 01-Jan-1966 Today's Date: 09/12/2020    History of Present Illness Darryl Sullivan is a 55 year old male with a past medical history significant for tobacco abuse who presents to [facility] ED on 09/05/2020 due to complaints of left-sided weakness and presyncope.      He reports that at work this morning he developed acute onset of left arm and leg weakness at approximately 07:00 a.m.  He denies facial asymmetry, slurred speech, headache, chest pain, palpitations, shortness of breath, cough, neck pain, back pain, dysuria.  Denies any history of stroke, high blood pressure, diabetes mellitus, alcohol or illicit drug abuse.  He is now on any blood thinners. CT shows small areas of ischemia in R basal ganglia and L temporal white matter.    PT Comments    Pet in bed, agreeable to participate- feels more tired today, had OT earlier in day. Pt assisted with donning of bilat boots, Left rigid AFO. ModA to sitting EOB, sitting balance is fair, RUE holding onto FOB. MinA for STS transfers due to LLE weakness, educated on multiple techniques in session >5, today introduced use of lower handle of HW for STS. Continued with AMB interval training this date. Tachycardia with rates in 140s concurrent with dizziness remains the biggest hurdle to longer distance AMB, however fatigue/soreness of RUE, as well as fatigue in low back both make themselves known. Pt is able to progress tolerated AMB distances today from 45ft to 69ft. These 34ft require 2-3 minutes of moderate exerting activity. Pt shows good retention of education for DME use previous day. Author adds a friction reducing mechanism to toerocker of Left boot for intervals 2 and 3 to facilitate swing phase when hip flexors begin to show fatigue. Author utilizes a chair follow to provide 2-3 min seated recovdery intervals during gait training. HR appears to recover faster today than previous  day by estimate 20-30%. Author uses fanny pack velcro to bring along tele box. Pt refuses remaining up to chair at end of session, as he is upset that NSG left him in chair for ~7 hours yesterday despite calling twice. Will continue to follow.  *MD Myriam Forehand made aware of HR trends with AMB previous day. No updated instructions were issued in response.      Follow Up Recommendations  CIR     Equipment Recommendations       Recommendations for Other Services Rehab consult     Precautions / Restrictions Precautions Precautions: Fall Precaution Comments: Left foot drop Sullivan spastic calf Restrictions Weight Bearing Restrictions: No    Mobility  Bed Mobility Overal bed mobility: Needs Assistance Bed Mobility: Supine to Sit;Sit to Supine     Supine to sit: Mod assist Sit to supine: Min assist        Transfers Overall transfer level: Needs assistance Equipment used: Hemi-walker Transfers: Sit to/from [employer] Sit to Stand: Min assist Stand pivot transfers: Mod assist       General transfer comment: doing well with HW, but strill struggle from lower surfaces  Ambulation/Gait   Gait Distance (Feet): 18 Feet (18+18+10) Assistive device: Hemi-walker (Left rigid AFO; sock over forefoot of boot) Gait Pattern/deviations: Step-to pattern Gait velocity: <0.8m/s   General Gait Details: 3-point step-to gait, good demonstration of maintaining HW at "1:30" position with FWD + lateral lean to facilitate passive LLE swing phase. Left knee only minimally flexed this date as AFO is better fit with foot fully in  shoe; AFO trial remains successful in assisting Left foot drop, abating Left knee buckle. Pt gets dizzy each AMB attempt  Sullivan HR jumping to 140s BPM but AMB intervals advanced today to 9ft versus 5ft previous day.   Stairs             Wheelchair Mobility    Modified Rankin (Stroke Patients Only)       Balance                                             Cognition Arousal/Alertness: Awake/alert Behavior During Therapy: WFL for tasks assessed/performed Overall Cognitive Status: Within Functional Limits for tasks assessed                                        Exercises      General Comments        Pertinent Vitals/Pain Pain Assessment: No/denies pain    Home Living                      Prior Function            PT Goals (current goals can now be found in the care plan section) Acute Rehab PT Goals Patient Stated Goal: to get better PT Goal Formulation: With patient Time For Goal Achievement: 09/20/20 Potential to Achieve Goals: Good Progress towards PT goals: Progressing toward goals    Frequency    7X/week      PT Plan Current plan remains appropriate    Co-evaluation              AM-PAC PT "6 Clicks" Mobility   Outcome Measure  Help needed turning from your back to your side while in a flat bed without using bedrails?: A Little Help needed moving from lying on your back to sitting on the side of a flat bed without using bedrails?: A Lot Help needed moving to and from a bed to a chair (including a wheelchair)?: A Lot Help needed standing up from a chair using your arms (e.g., wheelchair or bedside chair)?: A Lot Help needed to walk in hospital room?: A Lot Help needed climbing 3-5 steps with a railing? : Total 6 Click Score: 12    End of Session Equipment Utilized During Treatment: Gait belt (Left rigid AFO; bilat [place], sock over left boot toe rocker) Activity Tolerance: Patient tolerated treatment well;Treatment limited secondary to medical complications (Comment);Patient limited by fatigue Patient left: with call bell/phone within reach;in bed;Other (comment) (Pt reports NSG left him in chair yesterday from 11-6 despite calling twice and so he does not want to sit in the chair again.) Nurse Communication: Mobility status (vitals (CCMD called RN  during session)) PT Visit Diagnosis: Unsteadiness on feet (R26.81);Muscle weakness (generalized) (M62.81);Difficulty in walking, not elsewhere classified (R26.2);Hemiplegia and hemiparesis;Other abnormalities of gait and mobility (R26.89) Hemiplegia - Right/Left: Left Hemiplegia - dominant/non-dominant: Non-dominant Hemiplegia - caused by: Cerebral infarction     Time: 1545-1630 PT Time Calculation (min) (ACUTE ONLY): 45 min  Charges:  $Gait Training: 38-52 mins                     6:39 PM, 09/12/20 Rosamaria Lints, PT, DPT Physical Therapist - [facility]  [phone] (ASCOM)    Darryl Sullivan 09/12/2020, 6:30 PM

## 2020-09-12 NOTE — TOC Initial Note (Signed)
Transition of Care [facility]) - Initial/Assessment Note    Patient Details  Name: Darryl Sullivan MRN: [id number] Date of Birth: [birth year]  Transition of Care [facility]) CM/SW Contact:    Darryl Hutching, RN Phone Number: 09/12/2020, 11:12 AM  Clinical Narrative:                 Patient admitted to the hospital with Stroke.  RNCM met with patient at the bedside to discuss discharge planning.  Patient is from home where he lives with his nephew and nephew's wife.  Patient before having a stroke was independent and drove, and worked at [employer].  PT is recommending CIR and patient agrees with plan.  CIR started insurance auth on Friday.  Once insurance is approved patient can discharge to CIR.    Expected Discharge Plan: IP Rehab Facility Barriers to Discharge: Insurance Authorization   Patient Goals and CMS Choice Patient states their goals for this hospitalization and ongoing recovery are:: Patient excited to go to CIR CMS Medicare.gov Compare Post Acute Care list provided to:: Patient Choice offered to / list presented to : Patient  Expected Discharge Plan and Services Expected Discharge Plan: [place]   Discharge Planning Services: CM Consult Post Acute Care Choice: IP Rehab Living arrangements for the past 2 months: Mobile Home                 DME Arranged: N/A DME Agency: NA       HH Arranged: NA          Prior Living Arrangements/Services Living arrangements for the past 2 months: Mobile Home Lives with:: Relatives (nephew) Patient language and need for interpreter reviewed:: Yes Do you feel safe going back to the place where you live?: Yes      Need for Family Participation in Patient Care: Yes (Comment) (stroke) Care giver support system in place?: Yes (comment) (nephew [place])   Criminal Activity/Legal Involvement Pertinent to Current Situation/Hospitalization: No - Comment as needed  Activities of Daily Living      Permission Sought/Granted Permission  sought to share information with : Case Manager,Facility Contact Representative,Family Supports Permission granted to share information with : Yes, Verbal Permission Granted  Share Information with NAME: Darryl Sullivan  Permission granted to share info w AGENCY: CIR  Permission granted to share info w Relationship: nephew     Emotional Assessment Appearance:: Appears stated age Attitude/Demeanor/Rapport: Engaged Affect (typically observed): Accepting Orientation: : Oriented to Self,Oriented to Place,Oriented to  Time,Oriented to Situation Alcohol / Substance Use: Not Applicable Psych Involvement: No (comment)  Admission diagnosis:  AKI (acute kidney injury) ([place]) [N17.9] Hypertensive emergency [I16.1] Ischemic stroke ([place]) [I63.9] Cerebrovascular accident (CVA), unspecified mechanism ([place]) [I63.9] Patient Active Problem List   Diagnosis Date Noted  . Hypertensive emergency   . Cerebrovascular accident (CVA) ([place]) 09/05/2020   PCP:  Patient, No Pcp Per (Inactive) Pharmacy:   [id number] [place] [place] [place]Phone: [phone]: [phone]    Social Determinants of Health (SDOH) Interventions    Readmission Risk Interventions No flowsheet data found.

## 2020-09-12 NOTE — Progress Notes (Signed)
Occupational Therapy Treatment Patient Details Name: Darryl Sullivan MRN: [id number] DOB: September 21, 1965 Today's Date: 09/12/2020    History of present illness Darryl Sullivan is a 55 year old male with a past medical history significant for tobacco abuse who presents to [facility] ED on 09/05/2020 due to complaints of left-sided weakness and presyncope.      He reports that at work this morning he developed acute onset of left arm and leg weakness at approximately 07:00 a.m.  He denies facial asymmetry, slurred speech, headache, chest pain, palpitations, shortness of breath, cough, neck pain, back pain, dysuria.  Denies any history of stroke, high blood pressure, diabetes mellitus, alcohol or illicit drug abuse.  He is now on any blood thinners. CT shows small areas of ischemia in R basal ganglia and L temporal white matter.   OT comments  Pt seen for OT tx this date. Pt eager to participate and reiterates his motivation to continue to get better. OT facilitated seated dynamic balance activities requiring LUE and LLE weightbearing utilizing supported partial STS transfers to reach items on tray with RUE to L and R, L and R lateral leans utilizing weightbearing through L elbow when reaching to the R and through R elbow and input through L lateral thoracic flexors to support coming back to midline; facilitated partial STS transfers with pt using R hand to support L hand on L knee and push up to promote increased weightbearing through L side and support improved balance during transfers, requiring Min-MOD A to support initial attempts, improving to MIN A. Pt continues to demonstrate gains towards OT goals and improved independence. Continue to strongly recommend CIR.   Follow Up Recommendations  CIR    Equipment Recommendations  3 in 1 bedside commode    Recommendations for Other Services      Precautions / Restrictions Precautions Precautions: Fall Precaution Comments: Left foot drop c spastic  calf Restrictions Weight Bearing Restrictions: No       Mobility Bed Mobility Overal bed mobility: Needs Assistance Bed Mobility: Supine to Sit;Sit to Sidelying;Rolling Rolling: Supervision   Supine to sit: Min guard;HOB elevated   Sit to sidelying: Min guard General bed mobility comments: increased time/effort, cues for sequencing/hand placement to improve technique    Transfers Overall transfer level: Needs assistance Equipment used: None Transfers: Lateral/Scoot Transfers          Lateral/Scoot Transfers: Mod assist;Min assist General transfer comment: instructed in using R hand to support L hand on top of L knee to promote weightbearing and improved balance with L lateral scoots EOB with support at L knee    Balance Overall balance assessment: Needs assistance Sitting-balance support: Feet supported;Single extremity supported;No upper extremity supported Sitting balance-Leahy Scale: Good Sitting balance - Comments: Pt tolerated sitting EOB >56min with good static sitting balance and requiring MIN A for dynamic balance outside BOS while RUE reaching for various items on tray, CGA for R and L lateral leans                                   ADL either performed or assessed with clinical judgement   ADL                                               Vision  Perception     Praxis      Cognition Arousal/Alertness: Awake/alert Behavior During Therapy: WFL for tasks assessed/performed Overall Cognitive Status: Within Functional Limits for tasks assessed                                 General Comments: Pt is A and O x 4. motivated and agreeable for CIR        Exercises Other Exercises Other Exercises: OT facilitated seated dynamic balance activities requiring LUE and LLE weightbearing utilizing supported partial STS transfers to reach items on tray with RUE to L and R, L and R lateral leans utilizing  weightbearing through L elbow when reaching to the R and through R elbow and input through L lateral thoracic flexors to support coming back to midline; facilitated partial STS transfers with pt using R hand to support L hand on L knee and push up to promote increased weightbearing through L side and support improved balance during transfers, requiring Min-MOD A to support initial attempts, improving to MIN A   Shoulder Instructions       General Comments      Pertinent Vitals/ Pain       Pain Assessment: No/denies pain  Home Living                                          Prior Functioning/Environment              Frequency  Min 3X/week        Progress Toward Goals  OT Goals(current goals can now be found in the care plan section)  Progress towards OT goals: Progressing toward goals  Acute Rehab OT Goals Patient Stated Goal: to get better OT Goal Formulation: With patient Time For Goal Achievement: 09/20/20 Potential to Achieve Goals: Good  Plan Discharge plan remains appropriate;Frequency remains appropriate    Co-evaluation                 AM-PAC OT "6 Clicks" Daily Activity     Outcome Measure   Help from another person eating meals?: None Help from another person taking care of personal grooming?: A Little Help from another person toileting, which includes using toliet, bedpan, or urinal?: A Lot Help from another person bathing (including washing, rinsing, drying)?: A Lot Help from another person to put on and taking off regular upper body clothing?: A Lot Help from another person to put on and taking off regular lower body clothing?: A Lot 6 Click Score: 15    End of Session Equipment Utilized During Treatment: Gait belt  OT Visit Diagnosis: Other abnormalities of gait and mobility (R26.89);Hemiplegia and hemiparesis Hemiplegia - Right/Left: Left Hemiplegia - dominant/non-dominant: Non-Dominant Hemiplegia - caused by: Cerebral  infarction   Activity Tolerance Patient tolerated treatment well   Patient Left in bed;with call bell/phone within reach;with bed alarm set;with nursing/sitter in room   Nurse Communication          Time: 3976-7341 OT Time Calculation (min): 48 min  Charges: OT General Charges $OT Visit: 1 Visit OT Treatments $Neuromuscular Re-education: 38-52 mins  Wynona Canes, MPH, MS, OTR/L ascom [phone] 09/12/20, 12:24 PM

## 2020-09-12 NOTE — Progress Notes (Signed)
Progress Note    Darryl Sullivan  OMV:[id number] DOB: 08-19-1965  DOA: 09/05/2020 PCP: Patient, No Pcp Per (Inactive)      Brief Narrative:    Medical records reviewed and are as summarized below:  Darryl Sullivan is a 55 y.o. male with medical history significant for obesity, tobacco use disorder, hypertension, who was brought to the hospital because of left-sided weakness and presyncope.[name]  He was found to have acute right basal ganglia and left temporal white matter infarct/stroke.  He had mild aphasia on admission.  He was given TPA      Assessment/Plan:   Principal Problem:   Cerebrovascular accident (CVA) [facility]) Active Problems:   Hypertensive emergency    Body mass index is 37.35 kg/m.  (Obesity)   Acute stroke s/p TPA with a small hemorrhagic conversion: Repeat CT head today shows stability of punctate left basal ganglia hemorrhage.  Continue aspirin and Lipitor.  Continue PT and OT. Hemoglobin A1c is 4.7. Ambulatory cardiac monitor recommended at discharge.  Hyperlipidemia: Lipid panel showed total cholesterol 238, triglycerides 174, HDL 38, LDL 165.  Continue Lipitor.  Hypertension: Consider antihypertensives  CKD stage IIIa: Creatinine is stable  Obesity: Outpatient sleep study recommended for evaluation of OSA.  Awaiting placement to inpatient rehab.   Diet Order            Diet Heart Room service appropriate? Yes; Fluid consistency: Thin  Diet effective now                    Consultants:  Neurologist  Procedures:  None    Medications:   . amLODipine  10 mg Oral Daily  . aspirin EC  81 mg Oral Daily  . atorvastatin  80 mg Oral QHS  . enoxaparin (LOVENOX) injection  40 mg Subcutaneous Q24H  . hydrochlorothiazide  25 mg Oral Daily  . mouth rinse  15 mL Mouth Rinse BID  . pantoprazole  40 mg Oral QHS   Continuous Infusions:    Anti-infectives (From admission, onward)   None             Family  Communication/Anticipated D/C date and plan/Code Status   DVT prophylaxis: enoxaparin (LOVENOX) injection 40 mg Start: 09/08/20 2200 Place and maintain sequential compression device Start: 09/06/20 1128 SCD's Start: 09/05/20 1057     Code Status: Full Code  Family Communication: None Disposition Plan:    Status is: Inpatient  Remains inpatient appropriate because:Unsafe d/c plan and Inpatient level of care appropriate due to severity of illness   Dispo: The patient is from: Home              Anticipated d/c is to: Home              Patient currently is not medically stable to d/c.   Difficult to place patient No           Subjective:   Interval events noted.  He does not provide much history.  Objective:    Vitals:   09/11/20 2343 09/12/20 0554 09/12/20 0727 09/12/20 1024  BP: (!) 149/93 (!) 144/91 (!) 123/101 110/88  Pulse: 89 92 89 89  Resp: 16 16 15 15   Temp: (!) 97.5 F (36.4 C) 98.2 F (36.8 C) 98.9 F (37.2 C) 98.6 F (37 C)  TempSrc: Oral  Oral Oral  SpO2: 98% 95% 96% 98%  Weight:      Height:       No data found.  Intake/Output Summary (Last 24 hours) at 09/12/2020 1529 Last data filed at 09/12/2020 1010 Gross per 24 hour  Intake 240 ml  Output 500 ml  Net -260 ml   Filed Weights   09/05/20 0935 09/05/20 1006  Weight: 103 kg 108.2 kg    Exam:  GEN: NAD SKIN: No rash EYES: EOMI ENT: MMM CV: RRR PULM: CTA B ABD: soft, ND, NT, +BS CNS: AAO x 3, slurred speech, left facial droop, left-sided weakness (power 3/5 left upper and lower extremities) EXT: No edema or tenderness         Data Reviewed:   I have personally reviewed following labs and imaging studies:  Labs: Labs show the following:   Basic Metabolic Panel: Recent Labs  Lab 09/06/20 0511 09/07/20 0543 09/08/20 0551 09/10/20 0413  NA 139 137 139 137  K 3.5 3.5 3.7 3.6  CL 106 105 106 101  CO2 25 25 24 26   GLUCOSE 113* 126* 119* 106*  BUN 22* 24* 28* 36*   CREATININE 1.43* 1.26* 1.37* 1.42*  CALCIUM 9.6 9.4 9.4 9.8  MG 2.0  --   --   --   PHOS 3.9  --   --   --    GFR Estimated Creatinine Clearance: 68.9 mL/min (A) (by C-G formula based on SCr of 1.42 mg/dL (H)). Liver Function Tests: No results for input(s): AST, ALT, ALKPHOS, BILITOT, PROT, ALBUMIN in the last 168 hours. No results for input(s): LIPASE, AMYLASE in the last 168 hours. No results for input(s): AMMONIA in the last 168 hours. Coagulation profile Recent Labs  Lab 09/06/20 0511  INR 1.2    CBC: Recent Labs  Lab 09/06/20 0511 09/07/20 0543 09/08/20 0551  WBC 8.1 8.4 6.7  HGB 13.8 14.2 13.7  HCT 43.9 44.3 43.1  MCV 88.9 87.9 89.6  PLT 191 189 179   Cardiac Enzymes: No results for input(s): CKTOTAL, CKMB, CKMBINDEX, TROPONINI in the last 168 hours. BNP (last 3 results) No results for input(s): PROBNP in the last 8760 hours. CBG: No results for input(s): GLUCAP in the last 168 hours. D-Dimer: No results for input(s): DDIMER in the last 72 hours. Hgb A1c: No results for input(s): HGBA1C in the last 72 hours. Lipid Profile: No results for input(s): CHOL, HDL, LDLCALC, TRIG, CHOLHDL, LDLDIRECT in the last 72 hours. Thyroid function studies: No results for input(s): TSH, T4TOTAL, T3FREE, THYROIDAB in the last 72 hours.  Invalid input(s): FREET3 Anemia work up: No results for input(s): VITAMINB12, FOLATE, FERRITIN, TIBC, IRON, RETICCTPCT in the last 72 hours. Sepsis Labs: Recent Labs  Lab 09/06/20 0511 09/07/20 0543 09/08/20 0551  WBC 8.1 8.4 6.7    Microbiology Recent Results (from the past 240 hour(s))  Resp Panel by RT-PCR (Flu A&B, Covid) Nasopharyngeal Swab     Status: None   Collection Time: 09/05/20  9:37 AM   Specimen: Nasopharyngeal Swab; Nasopharyngeal(NP) swabs in vial transport medium  Result Value Ref Range Status   SARS Coronavirus 2 by RT PCR NEGATIVE NEGATIVE Final    Comment: (NOTE) SARS-CoV-2 target nucleic acids are NOT  DETECTED.  The SARS-CoV-2 RNA is generally detectable in upper respiratory specimens during the acute phase of infection. The lowest concentration of SARS-CoV-2 viral copies this assay can detect is 138 copies/mL. A negative result does not preclude SARS-Cov-2 infection and should not be used as the sole basis for treatment or other patient management decisions. A negative result may occur with  improper specimen collection/handling, submission of specimen other  than nasopharyngeal swab, presence of viral mutation(s) within the areas targeted by this assay, and inadequate number of viral copies(<138 copies/mL). A negative result must be combined with clinical observations, patient history, and epidemiological information. The expected result is Negative.  Fact Sheet for Patients:  BloggerCourse.com  Fact Sheet for Healthcare Providers:  SeriousBroker.it  This test is no t yet approved or cleared by the [place] FDA and  has been authorized for detection and/or diagnosis of SARS-CoV-2 by FDA under an Emergency Use Authorization (EUA). This EUA will remain  in effect (meaning this test can be used) for the duration of the COVID-19 declaration under Section 564(b)(1) of the Act, 21 U.S.C.section 360bbb-3(b)(1), unless the authorization is terminated  or revoked sooner.       Influenza A by PCR NEGATIVE NEGATIVE Final   Influenza B by PCR NEGATIVE NEGATIVE Final    Comment: (NOTE) The Xpert Xpress SARS-CoV-2/FLU/RSV plus assay is intended as an aid in the diagnosis of influenza from Nasopharyngeal swab specimens and should not be used as a sole basis for treatment. Nasal washings and aspirates are unacceptable for Xpert Xpress SARS-CoV-2/FLU/RSV testing.  Fact Sheet for Patients: BloggerCourse.com  Fact Sheet for Healthcare Providers: SeriousBroker.it  This test is not yet  approved or cleared by the [place] FDA and has been authorized for detection and/or diagnosis of SARS-CoV-2 by FDA under an Emergency Use Authorization (EUA). This EUA will remain in effect (meaning this test can be used) for the duration of the COVID-19 declaration under Section 564(b)(1) of the Act, 21 U.S.C. section 360bbb-3(b)(1), unless the authorization is terminated or revoked.  Performed at [facility], [address] [place], [place] [address]   MRSA PCR Screening     Status: None   Collection Time: 09/05/20  8:00 PM   Specimen: Nasopharyngeal  Result Value Ref Range Status   MRSA by PCR NEGATIVE NEGATIVE Final    Comment:        The GeneXpert MRSA Assay (FDA approved for NASAL specimens only), is one component of a comprehensive MRSA colonization surveillance program. It is not intended to diagnose MRSA infection nor to guide or monitor treatment for MRSA infections. Performed at [facility], [address] [place], [place] [address]     Procedures and diagnostic studies:  No results found.             LOS: 7 days   Massiel Stipp  [facility]   Pager on [email or web address]. If 7PM-7AM, please contact night-coverage at [email or web address]     09/12/2020, 3:29 PM

## 2020-09-13 DIAGNOSIS — I63312 Cerebral infarction due to thrombosis of left middle cerebral artery: Secondary | ICD-10-CM

## 2020-09-13 MED ORDER — METOPROLOL TARTRATE 25 MG PO TABS
25.0000 mg | ORAL_TABLET | Freq: Two times a day (BID) | ORAL | Status: DC
  Administered 2020-09-13 – 2020-09-14 (×3): 25 mg via ORAL
  Filled 2020-09-13 (×3): qty 1

## 2020-09-13 MED ORDER — DICLOFENAC SODIUM 1 % EX GEL
2.0000 g | Freq: Four times a day (QID) | CUTANEOUS | Status: DC | PRN
  Administered 2020-09-13: 22:00:00 2 g via TOPICAL
  Filled 2020-09-13: qty 100

## 2020-09-13 NOTE — Consult Note (Signed)
Physical Medicine and Rehabilitation Consult Reason for Consult: CVA Referring Physician: Lurene Shadow, MD   HPI: Darryl Sullivan is a 55 y.o. male with PMH of tobacco abuse who presents to the [facility] ED on 3/28 with left-sided weakness and presyncope that he developed at work that morning. CT showed small area of ischemia in the R basal ganglia and L temporal white matter. Physical Medicine & Rehabilitation was consulted to assess candidacy for CIR.    Review of Systems  Constitutional: Negative.   HENT: Negative.   Eyes: Negative.   Respiratory: Negative.   Cardiovascular: Negative.   Gastrointestinal: Negative.   Genitourinary: Negative.   Musculoskeletal: Positive for joint pain.  Skin: Negative.   Neurological: Positive for focal weakness and weakness. Negative for sensory change.  Endo/Heme/Allergies: Negative.   Psychiatric/Behavioral: Negative.    No past medical history on file. Past Surgical History:  Procedure Laterality Date  . NO PAST SURGERIES     Family History  Problem Relation Age of Onset  . Diabetes Mother    Social History:  reports that he has been smoking cigarettes. He has been smoking about 0.25 packs per day. His smokeless tobacco use includes chew. He reports that he does not drink alcohol and does not use drugs. Allergies:  Allergies  Allergen Reactions  . Penicillins Hives  . Tramadol Hives   No medications prior to admission.    Home: Home Living Family/patient expects to be discharged to:: Inpatient rehab Living Arrangements: Other relatives (nephew and nephew's wife) Available Help at Discharge: Family,Available PRN/intermittently Type of Home: House Home Access: Stairs to enter [employer] of Steps: a few steps with rail Home Layout: One level Home Equipment: None  Functional History: Prior Function Level of Independence: Independent Comments: Pt independent, working in [employer], no falls Functional  Status:  Mobility: Bed Mobility Overal bed mobility: Needs Assistance Bed Mobility: Sit to Supine Rolling: Supervision Sidelying to sit: Min assist Supine to sit: Min assist,Mod assist Sit to supine: Min assist Sit to sidelying: Min guard General bed mobility comments: Min-Mod A for LLE mgt back to bed, does a good job of protecting his LUE without cues Transfers Overall transfer level: Needs assistance Equipment used: Hemi-walker Transfers: Sit to/from Stand Sit to Stand: Min assist Stand pivot transfers: Mod assist  Lateral/Scoot Transfers: Mod assist,Min assist General transfer comment: cues for force through [facility] Ambulation/Gait Ambulation/Gait assistance: Min guard Gait Distance (Feet): 20 Feet [phone] (wants to take it easier today due to fatigue)) Assistive device: Hemi-walker (Left rigid AFO; sock over forefoot of boot) Gait Pattern/deviations: Step-to pattern General Gait Details: 3-point step-to gait, good demonstration of maintaining HW at "1:30" position with FWD + lateral lean to facilitate passive LLE swing phase. Left knee only minimally flexed this date as AFO is better fit with foot fully in shoe; has 1 instance of full out buckling durign third AMB interval but AFO helps patient recover without physical assistance from author. AFO trial remains successful in assisting Left foot drop, abating Left knee inmstability. HR WNL this date, mid 80s at rest, peaking in low 100s during AMB, pt denies any dizziness today while up. Gait velocity: 0.17m/s    ADL: ADL Overall ADL's : Needs assistance/impaired Eating/Feeding: Modified independent Eating/Feeding Details (indicate cue type and reason): with dom RUE Grooming: Wash/dry face,Sitting,Moderate assistance Grooming Details (indicate cue type and reason): MOD A for LUE support at the elbow with pt supporting L wrist/hand with his RUE to bring washcloth  to face incorporating BUE into task General ADL Comments: Anticipate  MOD-MAX A for LB ADL and UB ADL in seated position given L side impairments  Cognition: Cognition Overall Cognitive Status: Within Functional Limits for tasks assessed Orientation Level: Oriented X4 Cognition Arousal/Alertness: Awake/alert Behavior During Therapy: WFL for tasks assessed/performed Overall Cognitive Status: Within Functional Limits for tasks assessed General Comments: eager to participate throughout, excited to share with therapist that the case mgr told him he's going to CIR tomorrow  Blood pressure 110/69, pulse 79, temperature 98.2 F (36.8 C), resp. rate 18, height 5\' 7"  (1.702 m), weight 108.2 kg, SpO2 99 %. Physical Exam  Gen: no distress, normal appearing HEENT: oral mucosa pink and moist, NCAT Cardio: Reg rate Chest: normal effort, normal rate of breathing Abd: soft, non-distended Ext: no edema Psych: pleasant, normal affect Skin: intact Neuro: Alert and oriented x3.  Musculoskeletal: 5/5 strength right side. Left side with 3/5 EF, HF, KE, otherwise 0/5. Left shoulder subluxation  No results found for this or any previous visit (from the past 24 hour(s)). No results found.   Assessment/Plan: Diagnosis: R basal ganglia infarction 1. Does the need for close, 24 hr/day medical supervision in concert with the patient's rehab needs make it unreasonable for this patient to be served in a less intensive setting? Yes 2. Co-Morbidities requiring supervision/potential complications:  1. Tobacco smoker history 2. Severe left sided weakness 3. Left sided shoulder subluxation: voltaren gel ordered 4. Left sided wrist drop: WHO ordered 5. Left sided foot drop: AFO ordered.  6. Hypertensive emergency 3. Due to bladder management, bowel management, safety, skin/wound care, disease management, medication administration, pain management and patient education, does the patient require 24 hr/day rehab nursing? Yes 4. Does the patient require coordinated care of a  physician, rehab nurse, therapy disciplines of PT, OT to address physical and functional deficits in the context of the above medical diagnosis(es)? Yes Addressing deficits in the following areas: balance, endurance, locomotion, strength, transferring, bowel/bladder control, bathing, dressing, feeding, grooming, toileting and psychosocial support 5. Can the patient actively participate in an intensive therapy program of at least 3 hrs of therapy per day at least 5 days per week? Yes 6. The potential for patient to make measurable gains while on inpatient rehab is excellent 7. Anticipated functional outcomes upon discharge from inpatient rehab are modified independent  with PT, modified independent with OT, independent with SLP. 8. Estimated rehab length of stay to reach the above functional goals is: 5-7 days 9. Anticipated discharge destination: Home 10. Overall Rehab/Functional Prognosis: excellent  RECOMMENDATIONS: This patient's condition is appropriate for continued rehabilitative care in the following setting: CIR Patient has agreed to participate in recommended program. Yes Note that insurance prior authorization may be required for reimbursement for recommended care.  Comment: Thank you for this consult. Admission coordinator to follow.   I have personally performed a face to face diagnostic evaluation, including, but not limited to relevant history and physical exam findings, of this patient and developed relevant assessment and plan.  Additionally, I have reviewed and concur with the physician assistant's documentation above.  , MD  Sula Soda, MD 09/13/2020

## 2020-09-13 NOTE — Progress Notes (Signed)
Occupational Therapy Treatment Patient Details Name: AYHAM WORD MRN: [id number] DOB: [birth year] Today's Date: 09/13/2020    History of present illness Naod Sweetland is a 55 year old male with a past medical history significant for tobacco abuse who presents to [facility] ED on 09/05/2020 due to complaints of left-sided weakness and presyncope.      He reports that at work this morning he developed acute onset of left arm and leg weakness at approximately 07:00 a.m.  He denies facial asymmetry, slurred speech, headache, chest pain, palpitations, shortness of breath, cough, neck pain, back pain, dysuria.  Denies any history of stroke, high blood pressure, diabetes mellitus, alcohol or illicit drug abuse.  He is now on any blood thinners. CT shows small areas of ischemia in R basal ganglia and L temporal white matter.   OT comments  Pt seen for OT tx this date, received in recliner after PT session and eager to participate. Pt also eager to share with OT that the case manager told him he is going to CIR tomorrow. Manually facilitated AAROM LUE for table top activities involving supported shoulder flexion/ext, internal/external rotation requiring max manual cues to ellicit muscle contraction (more with elbow extension) and unable to sustain contraction. Pt performed STS and SPT transfer with HW and MIN-MOD A and Min VC for HW, LUE supported. Manually facilitated digit opposition, abd/add, and flex/ext as well as wrist flexion/ext PROM for 1 set x10 reps each. Pt able to initiate muscle contraction in AAROM for elbow flexion (<1/3 range), and shld flexion (OT stabilizing for subluxation prevention) AAROM for 1 set x10 reps of each. Pt continues to benefit from skilled OT services to maximize return to PLOF. Continue to recommend CIR.   Follow Up Recommendations  CIR    Equipment Recommendations  3 in 1 bedside commode    Recommendations for Other Services      Precautions / Restrictions  Precautions Precautions: Fall Precaution Comments: Left foot drop c spastic calf Restrictions Weight Bearing Restrictions: No       Mobility Bed Mobility Overal bed mobility: Needs Assistance Bed Mobility: Sit to Supine     Supine to sit: Min assist;Mod assist     General bed mobility comments: Min-Mod A for LLE mgt back to bed, does a good job of protecting his LUE without cues    Transfers Overall transfer level: Needs assistance Equipment used: Hemi-walker Transfers: Sit to/from Stand Sit to Stand: Min assist Stand pivot transfers: Mod assist       General transfer comment: cues for force through [facility]    Balance Overall balance assessment: Needs assistance Sitting-balance support: Feet supported;No upper extremity supported Sitting balance-Leahy Scale: Good     Standing balance support: Single extremity supported;During functional activity Standing balance-Leahy Scale: Poor Standing balance comment: with AFO, HW, pt has much improved capcity for upright stance without LOB today.                           ADL either performed or assessed with clinical judgement   ADL                                               Vision       Perception     Praxis      Cognition Arousal/Alertness: Awake/alert Behavior During Therapy: [facility] for  tasks assessed/performed Overall Cognitive Status: Within Functional Limits for tasks assessed                                 General Comments: eager to participate throughout, excited to share with therapist that the case mgr told him he's going to CIR tomorrow        Exercises Other Exercises Other Exercises: Manually facilitated AAROM LUE for table top activities involving supported shoulder flexion/ext, internal/external rotation requiring max manual cues to ellicit muscle contraction (more with elbow extension) and unable to sustain contraction Other Exercises: STS and SPT  transfer with HW and MIN-MOD A Other Exercises: Manually facilitated digit opposition, abd/add, and flex/ext as well as wrist flexion/ext PROM for 1 set x10 reps each. Pt able to initiate muscle contraction in AAROM for elbow flexion (<1/3 range), and shld flexion (OT stabilizing for subluxation prevention) AAROM for 1 set x10 reps of each.    Shoulder Instructions       General Comments      Pertinent Vitals/ Pain       Pain Assessment: No/denies pain Faces Pain Scale: Hurts little more Pain Location: Left UE, left back/trunk Pain Intervention(s): Limited activity within patient's tolerance;Monitored during session  Home Living                                          Prior Functioning/Environment              Frequency  Min 3X/week        Progress Toward Goals  OT Goals(current goals can now be found in the care plan section)  Progress towards OT goals: Progressing toward goals  Acute Rehab OT Goals Patient Stated Goal: to get better OT Goal Formulation: With patient Time For Goal Achievement: 09/20/20 Potential to Achieve Goals: Good  Plan Discharge plan remains appropriate;Frequency remains appropriate    Co-evaluation                 AM-PAC OT "6 Clicks" Daily Activity     Outcome Measure   Help from another person eating meals?: None Help from another person taking care of personal grooming?: A Little Help from another person toileting, which includes using toliet, bedpan, or urinal?: A Lot Help from another person bathing (including washing, rinsing, drying)?: A Lot Help from another person to put on and taking off regular upper body clothing?: A Lot Help from another person to put on and taking off regular lower body clothing?: A Lot 6 Click Score: 15    End of Session    OT Visit Diagnosis: Other abnormalities of gait and mobility (R26.89);Hemiplegia and hemiparesis Hemiplegia - Right/Left: Left Hemiplegia -  dominant/non-dominant: Non-Dominant Hemiplegia - caused by: Cerebral infarction   Activity Tolerance Patient tolerated treatment well   Patient Left in bed;with call bell/phone within reach;with bed alarm set   Nurse Communication          Time: 0160-1093 OT Time Calculation (min): 29 min  Charges: OT General Charges $OT Visit: 1 Visit OT Treatments $Neuromuscular Re-education: 23-37 mins  Wynona Canes, MPH, MS, OTR/L ascom [phone] 09/13/20, 3:56 PM

## 2020-09-13 NOTE — Progress Notes (Signed)
Inpatient Rehab Admissions Coordinator:   Continue to await insurance authorization.  Per [employer] rep, status is still pending.   Estill Dooms, PT, DPT Admissions Coordinator [phone] 09/13/20  8:17 AM

## 2020-09-13 NOTE — Progress Notes (Signed)
Orthopedic Tech Progress Note Patient Details:  Darryl Sullivan 1965-08-02 [id number] Called in order to [employer] for a WRIST SPLINT and an AFO Patient ID: Darryl Sullivan, male   DOB: 1966/04/17, 55 y.o.   MRN: [id number]   Darryl Sullivan 09/13/2020, 5:43 PM

## 2020-09-13 NOTE — TOC Progression Note (Signed)
Transition of Care [facility]) - Progression Note    Patient Details  Name: Darryl Sullivan MRN: [id number] Date of Birth: July 16, 1965  Transition of Care [facility]) CM/SW Contact  Allayne Butcher, RN Phone Number: 09/13/2020, 2:10 PM  Clinical Narrative:    Received updated from CIR that insurance has approved for authorization to inpatient rehab.  CIR can accept patient tomorrow.  Plan to discharge to CIR tomorrow.    Expected Discharge Plan: IP Rehab Facility Barriers to Discharge: Insurance Authorization  Expected Discharge Plan and Services Expected Discharge Plan: IP Rehab Facility   Discharge Planning Services: CM Consult Post Acute Care Choice: IP Rehab Living arrangements for the past 2 months: Mobile Home                 DME Arranged: N/A DME Agency: NA       HH Arranged: NA           Social Determinants of Health (SDOH) Interventions    Readmission Risk Interventions No flowsheet data found.

## 2020-09-13 NOTE — Progress Notes (Signed)
Physical Therapy Treatment Patient Details Name: Darryl Sullivan MRN: [id number] DOB: Oct 15, 1965 Today's Date: 09/13/2020    History of Present Illness Darryl Sullivan is a 55 year old male with a past medical history significant for tobacco abuse who presents to [facility] ED on 09/05/2020 due to complaints of left-sided weakness and presyncope.      He reports that at work this morning he developed acute onset of left arm and leg weakness at approximately 07:00 a.m.  He denies facial asymmetry, slurred speech, headache, chest pain, palpitations, shortness of breath, cough, neck pain, back pain, dysuria.  Denies any history of stroke, high blood pressure, diabetes mellitus, alcohol or illicit drug abuse.  He is now on any blood thinners. CT shows small areas of ischemia in R basal ganglia and L temporal white matter.    PT Comments    Pt in bed upon arrival, agreeable to session. Pt reports his LUE and left trunk/back are fairly sore, difficulty in describing exactly. Author assists with donning of boots and trial rigid AFO LLE. Pt showing improved strength with all transfers today, requires only slight minA. Pt asks to make shorter AMB distances today after 2 prior days of long AMB workouts. AMB continues to improve, good maintaining of educational interventions from previous day, HW use appropriate and optimal 80% of time without cues. AFO continues to help with stability of knee, only having 1-2 major left knee buckles, but AFO prevents all out loss of stability. Finished with sitting exercises in chair. Pt agreeable to sit-up in chair and rest until OT session in a few minutes.   Follow Up Recommendations  CIR     Equipment Recommendations  Other (comment)    Recommendations for Other Services Rehab consult     Precautions / Restrictions Precautions Precautions: Fall Precaution Comments: Left foot drop c spastic calf    Mobility  Bed Mobility Overal bed mobility: Needs Assistance Bed Mobility:  Supine to Sit     Supine to sit: Mod assist     General bed mobility comments: min-ModA of trunk and assist with LLE OOB    Transfers Overall transfer level: Needs assistance Equipment used: Hemi-walker Transfers: Sit to/from Stand Sit to Stand: Min assist         General transfer comment: doing well with HW, but needs more practice with heavy UE force downward into HW; durign gait training consistently pulled the HW off the floor.  Ambulation/Gait Ambulation/Gait assistance: Min guard Gait Distance (Feet): 20 Feet [phone] (wants to take it easier today due to fatigue))     Gait velocity: 0.67m/s   General Gait Details: 3-point step-to gait, good demonstration of maintaining HW at "1:30" position with FWD + lateral lean to facilitate passive LLE swing phase. Left knee only minimally flexed this date as AFO is better fit with foot fully in shoe; has 1 instance of full out buckling durign third AMB interval but AFO helps patient recover without physical assistance from author. AFO trial remains successful in assisting Left foot drop, abating Left knee inmstability. HR WNL this date, mid 80s at rest, peaking in low 100s during AMB, pt denies any dizziness today while up.   Stairs             Wheelchair Mobility    Modified Rankin (Stroke Patients Only)       Balance           Standing balance support: Single extremity supported;During functional activity Standing balance-Leahy Scale: Poor Standing balance comment:  with AFO, HW, pt has much improved capcity for upright stance without LOB today.                            Cognition Arousal/Alertness: Awake/alert Behavior During Therapy: WFL for tasks assessed/performed Overall Cognitive Status: Within Functional Limits for tasks assessed                                 General Comments: requires some engagement to be interactive, still moderate dysarthria, otherwise cognitively  intact.      Exercises Other Exercises Other Exercises: Manually facilitated AA/ROM Left LAQ 1x10; Other Exercises: Manually facilitated AA/ROM Left hip/knee flexion 1x10; Other Exercises: Manually facilitated AA/ROM Left hip extension 1x5; Other Exercises: forward flexion from recliner with RUE reach to authors name badge; 1x10 Other Exercises: STS from recliner with corrected HW technique RUE pushing into the floor x1    General Comments        Pertinent Vitals/Pain Pain Assessment: Faces Faces Pain Scale: Hurts little more Pain Location: Left UE, left back/trunk Pain Intervention(s): Limited activity within patient's tolerance;Monitored during session    Home Living                      Prior Function            PT Goals (current goals can now be found in the care plan section) Acute Rehab PT Goals Patient Stated Goal: to get better PT Goal Formulation: With patient Time For Goal Achievement: 09/20/20 Potential to Achieve Goals: Good Progress towards PT goals: Progressing toward goals    Frequency    7X/week      PT Plan Current plan remains appropriate    Co-evaluation              AM-PAC PT "6 Clicks" Mobility   Outcome Measure  Help needed turning from your back to your side while in a flat bed without using bedrails?: A Little Help needed moving from lying on your back to sitting on the side of a flat bed without using bedrails?: A Lot Help needed moving to and from a bed to a chair (including a wheelchair)?: A Lot Help needed standing up from a chair using your arms (e.g., wheelchair or bedside chair)?: A Little Help needed to walk in hospital room?: A Little Help needed climbing 3-5 steps with a railing? : Total 6 Click Score: 14    End of Session Equipment Utilized During Treatment: Gait belt (AFO, boots, sock over Left toe rocker.) Activity Tolerance: Patient tolerated treatment well;Patient limited by fatigue;No increased  pain Patient left: with call bell/phone within reach;in chair Nurse Communication: Mobility status PT Visit Diagnosis: Unsteadiness on feet (R26.81);Muscle weakness (generalized) (M62.81);Difficulty in walking, not elsewhere classified (R26.2);Hemiplegia and hemiparesis;Other abnormalities of gait and mobility (R26.89) Hemiplegia - Right/Left: Left Hemiplegia - dominant/non-dominant: Non-dominant Hemiplegia - caused by: Cerebral infarction     Time: 8315-1761 PT Time Calculation (min) (ACUTE ONLY): 43 min  Charges:  $Gait Training: 23-37 mins $Therapeutic Exercise: 8-22 mins                     3:15 PM, 09/13/20 Rosamaria Lints, PT, DPT Physical Therapist - [facility]  [phone] (ASCOM)    Bellamy Rubey C 09/13/2020, 3:06 PM

## 2020-09-13 NOTE — Progress Notes (Addendum)
Progress Note    Darryl Sullivan  LTJ:[id number] DOB: 1965-07-17  DOA: 09/05/2020 PCP: Patient, No Pcp Per (Inactive)      Brief Narrative:    Medical records reviewed and are as summarized below:  Yazid L Chura is a 55 y.o. male with medical history significant for obesity, tobacco use disorder, hypertension, who was brought to the hospital because of left-sided weakness and presyncope.[name]  He was found to have acute right basal ganglia and left temporal white matter infarct/stroke.  He had mild aphasia on admission.  He was given TPA.  Neurologist recommended aspirin at discharge.  He was evaluated by PT and OT recommended further rehabilitation at the inpatient rehab center.  Blood pressure has improved with antihypertensives.  He was started on metoprolol because of exertional sinus tachycardia.  Follow-up with cardiologist as recommended for ambulatory cardiac event monitor.      Assessment/Plan:   Principal Problem:   Cerebrovascular accident (CVA) [facility] Fall) Active Problems:   Hypertensive emergency    Body mass index is 37.35 kg/m.  (Obesity)   Acute stroke s/p TPA with a small hemorrhagic conversion: Repeat CT head today shows stability of punctate left basal ganglia hemorrhage.  Continue aspirin and Lipitor.  Continue PT and OT. Hemoglobin A1c is 4.7. Ambulatory cardiac monitor recommended at discharge.  Hyperlipidemia: Lipid panel showed total cholesterol 238, triglycerides 174, HDL 38, LDL 165.  Continue Lipitor.  Sinus tachycardia that is worse with activity: Start metoprolol for rate control.  Hypertension: Continue antihypertensives  CKD stage IIIa: Creatinine is stable  Obesity: Outpatient sleep study recommended for evaluation of OSA.  Insurance authorization has been approved and patient can go to inpatient rehab tomorrow..   Diet Order            Diet Heart Room service appropriate? Yes; Fluid consistency: Thin  Diet effective now                     Consultants:  Neurologist  Procedures:  None    Medications:   . amLODipine  10 mg Oral Daily  . aspirin EC  81 mg Oral Daily  . atorvastatin  80 mg Oral QHS  . enoxaparin (LOVENOX) injection  40 mg Subcutaneous Q24H  . hydrochlorothiazide  25 mg Oral Daily  . mouth rinse  15 mL Mouth Rinse BID  . metoprolol tartrate  25 mg Oral BID  . pantoprazole  40 mg Oral QHS   Continuous Infusions:    Anti-infectives (From admission, onward)   None             Family Communication/Anticipated D/C date and plan/Code Status   DVT prophylaxis: enoxaparin (LOVENOX) injection 40 mg Start: 09/08/20 2200 Place and maintain sequential compression device Start: 09/06/20 1128 SCD's Start: 09/05/20 1057     Code Status: Full Code  Family Communication: None Disposition Plan:    Status is: Inpatient  Remains inpatient appropriate because:Unsafe d/c plan and Inpatient level of care appropriate due to severity of illness   Dispo: The patient is from: Home              Anticipated d/c is to: Home              Patient currently is not medically stable to d/c.   Difficult to place patient No           Subjective:   Interval events noted.  Patient's heart rate goes up with activity.  Objective:  Vitals:   09/13/20 0314 09/13/20 0745 09/13/20 1124 09/13/20 1536  BP: 137/80 (!) 138/99 138/88 110/69  Pulse: 96 86 83 79  Resp: 18 16 16 18   Temp: 98 F (36.7 C) 98 F (36.7 C) 98.6 F (37 C) 98.2 F (36.8 C)  TempSrc:      SpO2: 98% 97% 98% 99%  Weight:      Height:       No data found.   Intake/Output Summary (Last 24 hours) at 09/13/2020 1700 Last data filed at 09/13/2020 1100 Gross per 24 hour  Intake --  Output 850 ml  Net -850 ml   Filed Weights   09/05/20 0935 09/05/20 1006  Weight: 103 kg 108.2 kg    Exam:  GEN: NAD SKIN: Warm and dry EYES: No pallor or icterus ENT: MMM CV: RRR PULM: CTA B ABD: soft, ND, NT, +BS CNS: AAO  x 3, slurred speech, left facial droop, left-sided weakness (power 3/5 left upper and lower extremities) EXT: No edema or tenderness        Data Reviewed:   I have personally reviewed following labs and imaging studies:  Labs: Labs show the following:   Basic Metabolic Panel: Recent Labs  Lab 09/07/20 0543 09/08/20 0551 09/10/20 0413  NA 137 139 137  K 3.5 3.7 3.6  CL 105 106 101  CO2 25 24 26   GLUCOSE 126* 119* 106*  BUN 24* 28* 36*  CREATININE 1.26* 1.37* 1.42*  CALCIUM 9.4 9.4 9.8   GFR Estimated Creatinine Clearance: 68.9 mL/min (A) (by C-G formula based on SCr of 1.42 mg/dL (H)). Liver Function Tests: No results for input(s): AST, ALT, ALKPHOS, BILITOT, PROT, ALBUMIN in the last 168 hours. No results for input(s): LIPASE, AMYLASE in the last 168 hours. No results for input(s): AMMONIA in the last 168 hours. Coagulation profile No results for input(s): INR, PROTIME in the last 168 hours.  CBC: Recent Labs  Lab 09/07/20 0543 09/08/20 0551  WBC 8.4 6.7  HGB 14.2 13.7  HCT 44.3 43.1  MCV 87.9 89.6  PLT 189 179   Cardiac Enzymes: No results for input(s): CKTOTAL, CKMB, CKMBINDEX, TROPONINI in the last 168 hours. BNP (last 3 results) No results for input(s): PROBNP in the last 8760 hours. CBG: No results for input(s): GLUCAP in the last 168 hours. D-Dimer: No results for input(s): DDIMER in the last 72 hours. Hgb A1c: No results for input(s): HGBA1C in the last 72 hours. Lipid Profile: No results for input(s): CHOL, HDL, LDLCALC, TRIG, CHOLHDL, LDLDIRECT in the last 72 hours. Thyroid function studies: No results for input(s): TSH, T4TOTAL, T3FREE, THYROIDAB in the last 72 hours.  Invalid input(s): FREET3 Anemia work up: No results for input(s): VITAMINB12, FOLATE, FERRITIN, TIBC, IRON, RETICCTPCT in the last 72 hours. Sepsis Labs: Recent Labs  Lab 09/07/20 0543 09/08/20 0551  WBC 8.4 6.7    Microbiology Recent Results (from the past 240  hour(s))  Resp Panel by RT-PCR (Flu A&B, Covid) Nasopharyngeal Swab     Status: None   Collection Time: 09/05/20  9:37 AM   Specimen: Nasopharyngeal Swab; Nasopharyngeal(NP) swabs in vial transport medium  Result Value Ref Range Status   SARS Coronavirus 2 by RT PCR NEGATIVE NEGATIVE Final    Comment: (NOTE) SARS-CoV-2 target nucleic acids are NOT DETECTED.  The SARS-CoV-2 RNA is generally detectable in upper respiratory specimens during the acute phase of infection. The lowest concentration of SARS-CoV-2 viral copies this assay can detect is 138 copies/mL. A  negative result does not preclude SARS-Cov-2 infection and should not be used as the sole basis for treatment or other patient management decisions. A negative result may occur with  improper specimen collection/handling, submission of specimen other than nasopharyngeal swab, presence of viral mutation(s) within the areas targeted by this assay, and inadequate number of viral copies(<138 copies/mL). A negative result must be combined with clinical observations, patient history, and epidemiological information. The expected result is Negative.  Fact Sheet for Patients:  BloggerCourse.com  Fact Sheet for Healthcare Providers:  SeriousBroker.it  This test is no t yet approved or cleared by the [place] FDA and  has been authorized for detection and/or diagnosis of SARS-CoV-2 by FDA under an Emergency Use Authorization (EUA). This EUA will remain  in effect (meaning this test can be used) for the duration of the COVID-19 declaration under Section 564(b)(1) of the Act, 21 U.S.C.section 360bbb-3(b)(1), unless the authorization is terminated  or revoked sooner.       Influenza A by PCR NEGATIVE NEGATIVE Final   Influenza B by PCR NEGATIVE NEGATIVE Final    Comment: (NOTE) The Xpert Xpress SARS-CoV-2/FLU/RSV plus assay is intended as an aid in the diagnosis of influenza from  Nasopharyngeal swab specimens and should not be used as a sole basis for treatment. Nasal washings and aspirates are unacceptable for Xpert Xpress SARS-CoV-2/FLU/RSV testing.  Fact Sheet for Patients: BloggerCourse.com  Fact Sheet for Healthcare Providers: SeriousBroker.it  This test is not yet approved or cleared by the [place] FDA and has been authorized for detection and/or diagnosis of SARS-CoV-2 by FDA under an Emergency Use Authorization (EUA). This EUA will remain in effect (meaning this test can be used) for the duration of the COVID-19 declaration under Section 564(b)(1) of the Act, 21 U.S.C. section 360bbb-3(b)(1), unless the authorization is terminated or revoked.  Performed at [facility], [address] [place], [place] [address]   MRSA PCR Screening     Status: None   Collection Time: 09/05/20  8:00 PM   Specimen: Nasopharyngeal  Result Value Ref Range Status   MRSA by PCR NEGATIVE NEGATIVE Final    Comment:        The GeneXpert MRSA Assay (FDA approved for NASAL specimens only), is one component of a comprehensive MRSA colonization surveillance program. It is not intended to diagnose MRSA infection nor to guide or monitor treatment for MRSA infections. Performed at [facility], [address] [place], [place] [address]     Procedures and diagnostic studies:  No results found.             LOS: 8 days   Belal Scallon  [facility]   Pager on [email or web address]. If 7PM-7AM, please contact night-coverage at [email or web address]     09/13/2020, 5:00 PM

## 2020-09-14 ENCOUNTER — Encounter: Payer: Self-pay | Admitting: Internal Medicine

## 2020-09-14 ENCOUNTER — Inpatient Hospital Stay (HOSPITAL_COMMUNITY)
Admission: RE | Admit: 2020-09-14 | Discharge: 2020-10-07 | DRG: 057 | Disposition: A | Discharge status: Home Health | Payer: BC Managed Care – PPO | Source: Other Acute Inpatient Hospital | Attending: Physical Medicine & Rehabilitation | Admitting: Physical Medicine & Rehabilitation

## 2020-09-14 ENCOUNTER — Encounter (HOSPITAL_COMMUNITY): Payer: Self-pay | Admitting: Physical Medicine & Rehabilitation

## 2020-09-14 ENCOUNTER — Other Ambulatory Visit: Payer: Self-pay

## 2020-09-14 DIAGNOSIS — I6381 Other cerebral infarction due to occlusion or stenosis of small artery: Secondary | ICD-10-CM | POA: Diagnosis present

## 2020-09-14 DIAGNOSIS — I69354 Hemiplegia and hemiparesis following cerebral infarction affecting left non-dominant side: Principal | ICD-10-CM

## 2020-09-14 DIAGNOSIS — Z720 Tobacco use: Secondary | ICD-10-CM

## 2020-09-14 DIAGNOSIS — R7301 Impaired fasting glucose: Secondary | ICD-10-CM | POA: Diagnosis present

## 2020-09-14 DIAGNOSIS — M7542 Impingement syndrome of left shoulder: Secondary | ICD-10-CM | POA: Diagnosis present

## 2020-09-14 DIAGNOSIS — E785 Hyperlipidemia, unspecified: Secondary | ICD-10-CM

## 2020-09-14 DIAGNOSIS — I10 Essential (primary) hypertension: Secondary | ICD-10-CM | POA: Diagnosis present

## 2020-09-14 DIAGNOSIS — I69322 Dysarthria following cerebral infarction: Secondary | ICD-10-CM

## 2020-09-14 DIAGNOSIS — N179 Acute kidney failure, unspecified: Secondary | ICD-10-CM | POA: Diagnosis not present

## 2020-09-14 DIAGNOSIS — Z79899 Other long term (current) drug therapy: Secondary | ICD-10-CM

## 2020-09-14 DIAGNOSIS — Z833 Family history of diabetes mellitus: Secondary | ICD-10-CM | POA: Diagnosis not present

## 2020-09-14 DIAGNOSIS — I129 Hypertensive chronic kidney disease with stage 1 through stage 4 chronic kidney disease, or unspecified chronic kidney disease: Secondary | ICD-10-CM | POA: Diagnosis present

## 2020-09-14 DIAGNOSIS — M25512 Pain in left shoulder: Secondary | ICD-10-CM

## 2020-09-14 DIAGNOSIS — E876 Hypokalemia: Secondary | ICD-10-CM | POA: Diagnosis not present

## 2020-09-14 DIAGNOSIS — G8929 Other chronic pain: Secondary | ICD-10-CM

## 2020-09-14 DIAGNOSIS — Z7982 Long term (current) use of aspirin: Secondary | ICD-10-CM | POA: Diagnosis not present

## 2020-09-14 DIAGNOSIS — N183 Chronic kidney disease, stage 3 unspecified: Secondary | ICD-10-CM

## 2020-09-14 DIAGNOSIS — F17210 Nicotine dependence, cigarettes, uncomplicated: Secondary | ICD-10-CM | POA: Diagnosis present

## 2020-09-14 DIAGNOSIS — N1831 Chronic kidney disease, stage 3a: Secondary | ICD-10-CM | POA: Diagnosis present

## 2020-09-14 DIAGNOSIS — I63312 Cerebral infarction due to thrombosis of left middle cerebral artery: Secondary | ICD-10-CM | POA: Diagnosis not present

## 2020-09-14 HISTORY — DX: Essential (primary) hypertension: I10

## 2020-09-14 MED ORDER — ENOXAPARIN SODIUM 40 MG/0.4ML ~~LOC~~ SOLN
40.0000 mg | SUBCUTANEOUS | Status: DC
  Administered 2020-09-14 – 2020-10-06 (×23): 40 mg via SUBCUTANEOUS
  Filled 2020-09-14 (×23): qty 0.4

## 2020-09-14 MED ORDER — ASPIRIN EC 81 MG PO TBEC
81.0000 mg | DELAYED_RELEASE_TABLET | Freq: Every day | ORAL | Status: DC
  Administered 2020-09-15 – 2020-10-07 (×23): 81 mg via ORAL
  Filled 2020-09-14 (×23): qty 1

## 2020-09-14 MED ORDER — ATORVASTATIN CALCIUM 20 MG PO TABS: 80.0000 mg | ORAL_TABLET | Freq: Every day | ORAL | 0 refills | Status: DC

## 2020-09-14 MED ORDER — AMLODIPINE BESYLATE 10 MG PO TABS
10.0000 mg | ORAL_TABLET | Freq: Every day | ORAL | Status: DC
  Administered 2020-09-15 – 2020-10-07 (×23): 10 mg via ORAL
  Filled 2020-09-14 (×23): qty 1

## 2020-09-14 MED ORDER — ACETAMINOPHEN 325 MG PO TABS
325.0000 mg | ORAL_TABLET | ORAL | Status: DC | PRN
  Administered 2020-09-22: 650 mg via ORAL
  Filled 2020-09-14: qty 2

## 2020-09-14 MED ORDER — ALUM & MAG HYDROXIDE-SIMETH 200-200-20 MG/5ML PO SUSP
30.0000 mL | ORAL | Status: DC | PRN
Start: 2020-09-14 — End: 2020-10-07

## 2020-09-14 MED ORDER — PROCHLORPERAZINE MALEATE 5 MG PO TABS: 5.0000 mg | ORAL_TABLET | Freq: Four times a day (QID) | ORAL | Status: DC | PRN

## 2020-09-14 MED ORDER — METOPROLOL TARTRATE 25 MG PO TABS
25.0000 mg | ORAL_TABLET | Freq: Two times a day (BID) | ORAL | Status: DC
  Administered 2020-09-14 – 2020-10-07 (×46): 25 mg via ORAL
  Filled 2020-09-14 (×46): qty 1

## 2020-09-14 MED ORDER — DIPHENHYDRAMINE HCL 12.5 MG/5ML PO ELIX: 12.5000 mg | ORAL_SOLUTION | Freq: Four times a day (QID) | ORAL | Status: DC | PRN

## 2020-09-14 MED ORDER — ACETAMINOPHEN 325 MG PO TABS: 650.0000 mg | ORAL_TABLET | ORAL | 0 refills | Status: DC | PRN

## 2020-09-14 MED ORDER — ORAL CARE MOUTH RINSE
15.0000 mL | Freq: Two times a day (BID) | OROMUCOSAL | Status: DC
  Administered 2020-09-14 – 2020-10-06 (×34): 15 mL via OROMUCOSAL

## 2020-09-14 MED ORDER — ATORVASTATIN CALCIUM 80 MG PO TABS
80.0000 mg | ORAL_TABLET | Freq: Every day | ORAL | Status: DC
  Administered 2020-09-14 – 2020-09-17 (×4): 80 mg via ORAL
  Filled 2020-09-14 (×5): qty 1

## 2020-09-14 MED ORDER — SENNOSIDES-DOCUSATE SODIUM 8.6-50 MG PO TABS
1.0000 | ORAL_TABLET | Freq: Every evening | ORAL | Status: DC | PRN
Start: 2020-09-14 — End: 2020-10-07
  Administered 2020-09-16 – 2020-10-01 (×3): 1 via ORAL
  Filled 2020-09-14 (×3): qty 1

## 2020-09-14 MED ORDER — ASPIRIN 81 MG PO TBEC: 81.0000 mg | DELAYED_RELEASE_TABLET | Freq: Every day | ORAL | 0 refills | Status: DC

## 2020-09-14 MED ORDER — AMLODIPINE BESYLATE 10 MG PO TABS: 10.0000 mg | ORAL_TABLET | Freq: Every day | ORAL | 0 refills | Status: DC

## 2020-09-14 MED ORDER — PROCHLORPERAZINE 25 MG RE SUPP: 12.5000 mg | Freq: Four times a day (QID) | RECTAL | Status: DC | PRN

## 2020-09-14 MED ORDER — PROCHLORPERAZINE EDISYLATE 10 MG/2ML IJ SOLN: 5.0000 mg | Freq: Four times a day (QID) | INTRAMUSCULAR | Status: DC | PRN

## 2020-09-14 MED ORDER — BISACODYL 10 MG RE SUPP
10.0000 mg | Freq: Every day | RECTAL | Status: DC | PRN
  Administered 2020-09-18: 10 mg via RECTAL
  Filled 2020-09-14: qty 1

## 2020-09-14 MED ORDER — TRAZODONE HCL 50 MG PO TABS
25.0000 mg | ORAL_TABLET | Freq: Every evening | ORAL | Status: DC | PRN
  Administered 2020-09-16: 50 mg via ORAL
  Filled 2020-09-14: qty 1

## 2020-09-14 MED ORDER — EXERCISE FOR HEART AND HEALTH BOOK
Freq: Once | Status: AC
  Filled 2020-09-14: qty 1

## 2020-09-14 MED ORDER — FLEET ENEMA 7-19 GM/118ML RE ENEM: 1.0000 | ENEMA | Freq: Once | RECTAL | Status: DC | PRN

## 2020-09-14 MED ORDER — METOPROLOL TARTRATE 25 MG PO TABS: 25.0000 mg | ORAL_TABLET | Freq: Two times a day (BID) | ORAL | 0 refills | Status: DC

## 2020-09-14 MED ORDER — GUAIFENESIN-DM 100-10 MG/5ML PO SYRP: 5.0000 mL | ORAL_SOLUTION | Freq: Four times a day (QID) | ORAL | Status: DC | PRN

## 2020-09-14 MED ORDER — POLYETHYLENE GLYCOL 3350 17 G PO PACK
17.0000 g | PACK | Freq: Every day | ORAL | Status: DC | PRN
Start: 2020-09-14 — End: 2020-10-07
  Administered 2020-09-16 – 2020-10-01 (×2): 17 g via ORAL
  Filled 2020-09-14 (×2): qty 1

## 2020-09-14 MED ORDER — DICLOFENAC SODIUM 1 % EX GEL
2.0000 g | Freq: Four times a day (QID) | CUTANEOUS | Status: DC | PRN
  Filled 2020-09-14: qty 100

## 2020-09-14 MED ORDER — PANTOPRAZOLE SODIUM 40 MG PO TBEC
40.0000 mg | DELAYED_RELEASE_TABLET | Freq: Every day | ORAL | Status: DC
  Administered 2020-09-14 – 2020-09-17 (×4): 40 mg via ORAL
  Filled 2020-09-14 (×5): qty 1

## 2020-09-14 MED ORDER — PANTOPRAZOLE SODIUM 40 MG PO TBEC: 40.0000 mg | DELAYED_RELEASE_TABLET | Freq: Every day | ORAL | 0 refills | Status: DC

## 2020-09-14 MED ORDER — BLOOD PRESSURE CONTROL BOOK
Freq: Once | Status: AC
  Filled 2020-09-14: qty 1

## 2020-09-14 MED ORDER — HYDROCHLOROTHIAZIDE 25 MG PO TABS
25.0000 mg | ORAL_TABLET | Freq: Every day | ORAL | Status: DC
  Administered 2020-09-15 – 2020-10-07 (×23): 25 mg via ORAL
  Filled 2020-09-14 (×23): qty 1

## 2020-09-14 MED ORDER — HYDROCHLOROTHIAZIDE 25 MG PO TABS: 25.0000 mg | ORAL_TABLET | Freq: Every day | ORAL | 0 refills | Status: DC

## 2020-09-14 NOTE — PMR Pre-admission (Addendum)
PMR Admission Coordinator Pre-Admission Assessment  Patient: Darryl Sullivan is an 55 y.o., male MRN: [id number] DOB: [birth year] Height: 5' 7"  (170.2 cm) Weight: 108.2 kg              Insurance Information HMO:     PPO: yes     PCP:      IPA:      80/20:      OTHER:  PRIMARY: Rickey Primus      Policy#: [id number]      Subscriber:  CM Name: Ace      Phone#: [phone]     Fax#: [phone] Pre-Cert#: [id number] auth for CIR given by Ace with [employer] with update due to fax listed above on 4/11      Employer:  Benefits:  Phone #: [phone]     Name:  Eff. Date: 06/11/20     Deduct: $2800 ($0 met)      Out of Pocket Max: [phone] ($0 met)      Life Max: n/a  CIR: 80%      SNF: 80% Outpatient: 80%     Co-Ins: 20% Home Health: not covered      Co-Pay:  DME: 80%     Co-Ins: 20% Providers:  SECONDARY:       Policy#:       Phone#:   Development worker, community:       Phone#:   The Therapist, art Information Summary" for patients in Inpatient Rehabilitation Facilities with attached "Privacy Act [place] Records" was provided and verbally reviewed with: N/A  Emergency Contact Information Contact Information    Name Relation Home Work Barrera Daughter   [phone]   Raye Sorrow [phone]  [phone]   Emelia Loron [phone]  [phone]     Current Medical History  Patient Admitting Diagnosis: CVA   History of Present Illness: Pt is a 55 y/o male with PMH of obesity, tobacco use, and HTN who was admitted to [facility] on 3/28 with presyncope and L sided weakness.  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  Mild aphasia present on admission and pt was given tPA.  Neurology was consulted and recommended aspirin on discharge.  BP improved on antihypertensives and metoprolol was started due to tachhycardia with exertion.  Cardiology recommended outpatient follow up for event monitor.  Therapy evaluations were  completed and pt was recommended for CIR.  Complete NIHSS TOTAL: 5 Glasgow Coma Scale Score: 15  Past Medical History  No past medical history on file.  Family History  family history includes Diabetes in his mother.  Prior Rehab/Hospitalizations:  Has the patient had prior rehab or hospitalizations prior to admission? No  Has the patient had major surgery during 100 days prior to admission? No  Current Medications   Current Facility-Administered Medications:  .  acetaminophen (TYLENOL) tablet 650 mg, 650 mg, Oral, Q4H PRN **OR** acetaminophen (TYLENOL) 160 MG/5ML solution 650 mg, 650 mg, Per Tube, Q4H PRN **OR** acetaminophen (TYLENOL) suppository 650 mg, 650 mg, Rectal, Q4H PRN, Jennye Boroughs, MD .  amLODipine (NORVASC) tablet 10 mg, 10 mg, Oral, Daily, Jennye Boroughs, MD, 10 mg at 09/14/20 0925 .  aspirin EC tablet 81 mg, 81 mg, Oral, Daily, Jennye Boroughs, MD, 81 mg at 09/14/20 0924 .  atorvastatin (LIPITOR) tablet 80 mg, 80 mg, Oral, QHS, Jennye Boroughs, MD, 80 mg at 09/13/20 2141 .  diclofenac Sodium (VOLTAREN) 1 % topical gel 2 g, 2 g, Topical, QID  PRN, Izora Ribas, MD, 2 g at 09/13/20 2144 .  enoxaparin (LOVENOX) injection 40 mg, 40 mg, Subcutaneous, Q24H, Jennye Boroughs, MD, 40 mg at 09/13/20 2142 .  hydrochlorothiazide (HYDRODIURIL) tablet 25 mg, 25 mg, Oral, Daily, Jennye Boroughs, MD, 25 mg at 09/14/20 0925 .  labetalol (NORMODYNE) injection 10 mg, 10 mg, Intravenous, Q10 min PRN, Jennye Boroughs, MD, 10 mg at 09/09/20 1409 .  MEDLINE mouth rinse, 15 mL, Mouth Rinse, BID, Jennye Boroughs, MD, 15 mL at 09/14/20 0927 .  metoprolol tartrate (LOPRESSOR) tablet 25 mg, 25 mg, Oral, BID, Jennye Boroughs, MD, 25 mg at 09/14/20 0926 .  pantoprazole (PROTONIX) EC tablet 40 mg, 40 mg, Oral, QHS, Jennye Boroughs, MD, 40 mg at 09/13/20 2140 .  senna-docusate (Senokot-S) tablet 1 tablet, 1 tablet, Oral, QHS PRN, Jennye Boroughs, MD  Patients Current Diet:  Diet Order            Diet  - low sodium heart healthy           Diet Heart Room service appropriate? Yes; Fluid consistency: Thin  Diet effective now                 Precautions / Restrictions Precautions Precautions: Fall Precaution Comments: Left foot drop c spastic calf Restrictions Weight Bearing Restrictions: No   Has the patient had 2 or more falls or a fall with injury in the past year?No  Prior Activity Level Community (5-7x/wk): independent prior to admit, working full time as an Radio producer Level Prior Function Level of Independence: Independent Comments: Pt independent, working in Ambulance person, no falls  Self Care: Did the patient need help bathing, dressing, using the toilet or eating?  Independent  Indoor Mobility: Did the patient need assistance with walking from room to room (with or without device)? Independent  Stairs: Did the patient need assistance with internal or external stairs (with or without device)? Independent  Functional Cognition: Did the patient need help planning regular tasks such as shopping or remembering to take medications? Independent  Home Assistive Devices / Equipment Home Equipment: None  Prior Device Use: Indicate devices/aids used by the patient prior to current illness, exacerbation or injury? None of the above  Current Functional Level Cognition  Overall Cognitive Status: Within Functional Limits for tasks assessed Orientation Level: Oriented X4 General Comments: eager to participate throughout, excited to share with therapist that the case mgr told him he's going to CIR tomorrow    Extremity Assessment (includes Sensation/Coordination)  Upper Extremity Assessment: LUE deficits/detail (RUE WFL) LUE Deficits / Details: shoulder flexion 2+/5, shoulder abduction 2/5, elbow flex/ext in GE position 2-/5, composite finger flexion 3+ to 4-/5, sensation grossly intact LUE Coordination: decreased gross motor,decreased fine motor   Lower Extremity Assessment: LLE deficits/detail (RLE WFL) LLE Deficits / Details: at least 3/5, difficulty sustaining muscle activation during partial STS, sensation grossly intact LLE Sensation: decreased light touch LLE Coordination: decreased fine motor,decreased gross motor    ADLs  Overall ADL's : Needs assistance/impaired Eating/Feeding: Modified independent Eating/Feeding Details (indicate cue type and reason): with dom RUE Grooming: Wash/dry face,Sitting,Moderate assistance Grooming Details (indicate cue type and reason): MOD A for LUE support at the elbow with pt supporting L wrist/hand with his RUE to bring washcloth to face incorporating BUE into task General ADL Comments: Anticipate MOD-MAX A for LB ADL and UB ADL in seated position given L side impairments    Mobility  Overal bed mobility: Needs Assistance Bed Mobility: Sit to Supine  Rolling: Supervision Sidelying to sit: Min assist Supine to sit: Min assist,Mod assist Sit to supine: Min assist Sit to sidelying: Min guard General bed mobility comments: Min-Mod A for LLE mgt back to bed, does a good job of protecting his LUE without cues    Transfers  Overall transfer level: Needs assistance Equipment used: Hemi-walker Transfers: Sit to/from Stand Sit to Stand: Min assist Stand pivot transfers: Mod assist  Lateral/Scoot Transfers: Mod assist,Min assist General transfer comment: cues for force through [facility]    Ambulation / Gait / Stairs / Wheelchair Mobility  Ambulation/Gait Ambulation/Gait assistance: Min guard Gait Distance (Feet): 20 Feet [phone] (wants to take it easier today due to fatigue)) Assistive device: Hemi-walker (Left rigid AFO; sock over forefoot of boot) Gait Pattern/deviations: Step-to pattern General Gait Details: 3-point step-to gait, good demonstration of maintaining HW at "1:30" position with FWD + lateral lean to facilitate passive LLE swing phase. Left knee only minimally flexed this date as  AFO is better fit with foot fully in shoe; has 1 instance of full out buckling durign third AMB interval but AFO helps patient recover without physical assistance from author. AFO trial remains successful in assisting Left foot drop, abating Left knee inmstability. HR WNL this date, mid 80s at rest, peaking in low 100s during AMB, pt denies any dizziness today while up. Gait velocity: 0.71ms    Posture / Balance Dynamic Sitting Balance Sitting balance - Comments: Pt tolerated sitting EOB >246m with good static sitting balance and requiring MIN A for dynamic balance outside BOS while RUE reaching for various items on tray, CGA for R and L lateral leans Balance Overall balance assessment: Needs assistance Sitting-balance support: Feet supported,No upper extremity supported Sitting balance-Leahy Scale: Good Sitting balance - Comments: Pt tolerated sitting EOB >2528mwith good static sitting balance and requiring MIN A for dynamic balance outside BOS while RUE reaching for various items on tray, CGA for R and L lateral leans Standing balance support: Single extremity supported,During functional activity Standing balance-Leahy Scale: Poor Standing balance comment: with AFO, HW, pt has much improved capcity for upright stance without LOB today.    Special needs/care consideration n/a     Previous Home Environment (from acute therapy documentation) Living Arrangements: Other relatives (nephew and nephew's wife) Available Help at Discharge: Family,Available PRN/intermittently Type of Home: House Home Layout: One level Home Access: Stairs to enter [employer] Steps: a few steps with rail  Discharge Living Setting Plans for Discharge Living Setting: Lives with (comment) (nephew and his spouse) Type of Home at Discharge: Mobile home Discharge Home Layout: One level Discharge Home Access: Stairs to enter Entrance Stairs-Rails: Right Entrance Stairs-Number of Steps: 4-5 Discharge  Bathroom Shower/Tub: Tub/shower unit Discharge Bathroom Toilet: Standard Discharge Bathroom Accessibility: Yes How Accessible: Accessible via walker Does the patient have any problems obtaining your medications?: No  Social/Family/Support Systems Anticipated Caregiver: nephew (CoGeorgina Snellnticipated Caregiver's Contact Information: [phone]/Limitations of Caregiver: works full time [employer] adviservenings only Discharge Plan Discussed with Primary Caregiver: Yes Is Caregiver In Agreement with Plan?: Yes Does Caregiver/Family have Issues with Lodging/Transportation while Pt is in Rehab?: No   Goals Patient/Family Goal for Rehab: PT/OT mod I, SLP n/a Expected length of stay: 8-11 days Additional Information: will need to be mod I to return home with family Pt/Family Agrees to Admission and willing to participate: Yes Program Orientation Provided & Reviewed with Pt/Caregiver Including Roles  & Responsibilities: Yes  Barriers to Discharge: Insurance for SNF coverage  Decrease burden of Care through IP rehab admission: n/a   Possible need for SNF placement upon discharge:not anticipated.  Pt with mod I goals.    Patient Condition: I have reviewed medical records from [facility], spoken with CM, and patient. I discussed via phone for inpatient rehabilitation assessment.  Patient will benefit from ongoing PT and OT, can actively participate in 3 hours of therapy a day 5 days of the week, and can make measurable gains during the admission.  Patient will also benefit from the coordinated team approach during an Inpatient Acute Rehabilitation admission.  The patient will receive intensive therapy as well as Rehabilitation physician, nursing, social worker, and care management interventions.  Due to safety, medication administration, pain management and patient education the patient requires 24 hour a day rehabilitation nursing.  The patient is currently min assist with mobility and  basic ADLs.  Discharge setting and therapy post discharge at home with outpatient is anticipated.  Patient has agreed to participate in the Acute Inpatient Rehabilitation Program and will admit today.  Preadmission Screen Completed By:  Michel Santee, PT, DPT 09/14/2020 10:00 AM ______________________________________________________________________   Discussed status with Dr. Posey Pronto on 09/14/20 at 10:07 AM  and received approval for admission today.  Admission Coordinator:  Michel Santee, PT, DPT time 10:07 AM Sudie Grumbling 09/14/20

## 2020-09-14 NOTE — Progress Notes (Signed)
Inpatient Rehab Admissions Coordinator:    I have insurance approval and a bed available for pt to admit to CIR today. Dr. Chipper Herb in agreement.  Will let pt/family and TOC team know.  I will call and arrange transport with [facility] once pt's room on rehab is ready.  RN can call report at [phone].   Estill Dooms, PT, DPT Admissions Coordinator [phone] 09/14/20  9:58 AM

## 2020-09-14 NOTE — H&P (Addendum)
Physical Medicine and Rehabilitation Admission H&P    Chief Complaint  Patient presents with  . Stroke with functional deficits    HPI: Darryl Sullivan is a 55 year old male in relatively good health who was admitted on 08/28/2020 to [facility] with left hemiparesis and elevated blood pressure.  UDS negative.  History taken from chart review and patient due to slowed cognition. CT head unremarkable for acute intracranial process and patient received TPA.  CTA head/neck was negative for LVO or stenosis. MRI brain on 09/05/2020 discussed and reviewed with Neurology, showing white matter and basal ganglia infarcts on right. Echocardiogram with ejection fraction of 60-65% with moderate LVH.  Follow up CT personally reviewed, showing small left basal ganglia hemorrhage, likely hemorrhagic conversion. Neurology recommended low dose ASA for stroke due to small vessel disease and outpatient sleep study to rule out OSA.  Hospital course further complicated by AKI and renal ultrasound performed, showing increased cortical echogenicity c/w medical renal disease.  Meds titrated for BP control and metoprolol added tachycardia with activity. He continues to be limited by LUE/LLE weakness affecting ADLs as well as mobility. CIR recommended due to functional decline.  Please see preadmission assessment from earlier today as well.  Review of Systems  Constitutional: Negative for chills and fever.  HENT: Negative for hearing loss.   Eyes: Negative for blurred vision and double vision.  Respiratory: Negative for cough and shortness of breath.   Cardiovascular: Negative for chest pain and palpitations.  Gastrointestinal: Negative for constipation, heartburn and nausea.  Musculoskeletal: Negative for joint pain and myalgias.  Skin: Negative for itching and rash.  Neurological: Positive for speech change, focal weakness and weakness. Negative for dizziness, sensory change and headaches.  Psychiatric/Behavioral: The  patient is not nervous/anxious and does not have insomnia.   All other systems reviewed and are negative.  Has medical history: CKD, HTN  Past Surgical History:  Procedure Laterality Date  . NO PAST SURGERIES      Family History  Problem Relation Age of Onset  . Diabetes Mother     Social History:  reports that he has been smoking cigarettes. He has been smoking about 0.25 packs per day. His smokeless tobacco use includes chew. He reports that he does not drink alcohol and does not use drugs.   Allergies  Allergen Reactions  . Penicillins Hives  . Tramadol Hives   Medications Prior to Admission  Medication Sig Dispense Refill  . acetaminophen (TYLENOL) 325 MG tablet Take 2 tablets (650 mg total) by mouth every 4 (four) hours as needed for mild pain (or temp > 37.5 C (99.5 F)). 60 tablet 0  . amLODipine (NORVASC) 10 MG tablet Take 1 tablet (10 mg total) by mouth daily. 30 tablet 0  . aspirin EC 81 MG EC tablet Take 1 tablet (81 mg total) by mouth daily. Swallow whole. 30 tablet 0  . atorvastatin (LIPITOR) 20 MG tablet Take 4 tablets (80 mg total) by mouth at bedtime. 30 tablet 0  . [START ON 09/15/2020] hydrochlorothiazide (HYDRODIURIL) 25 MG tablet Take 1 tablet (25 mg total) by mouth daily. 30 tablet 0  . metoprolol tartrate (LOPRESSOR) 25 MG tablet Take 1 tablet (25 mg total) by mouth 2 (two) times daily. 30 tablet 0  . pantoprazole (PROTONIX) 40 MG tablet Take 1 tablet (40 mg total) by mouth at bedtime. 30 tablet 0    Drug Regimen Review  Drug regimen was reviewed and remains appropriate with no significant issues  identified  Home: Home Living Family/patient expects to be discharged to:: Inpatient rehab Living Arrangements: Other relatives (nephew and nephew's wife) Available Help at Discharge: Family,Available PRN/intermittently Type of Home: House Home Access: Stairs to enter [employer] of Steps: a few steps with rail Home Layout: One level Home Equipment:  None   Functional History: Prior Function Level of Independence: Independent Comments: Pt independent, working in Designer, multimedia, 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  Functional Status:  Mobility: Bed Mobility Overal bed mobility: Needs Assistance Bed Mobility: Sit to Supine Rolling: Supervision Sidelying to sit: Min assist Supine to sit: Min assist,Mod assist Sit to supine: Min assist Sit to sidelying: Min guard General bed mobility comments: Min-Mod A for LLE mgt back to bed, does a good job of protecting his LUE without cues Transfers Overall transfer level: Needs assistance Equipment used: Hemi-walker Transfers: Sit to/from Stand Sit to Stand: Min assist Stand pivot transfers: Mod assist  Lateral/Scoot Transfers: Mod assist,Min assist General transfer comment: cues for force through [facility] Ambulation/Gait Ambulation/Gait assistance: Min guard Gait Distance (Feet): 20 Feet [phone] (wants to take it easier today due to fatigue)) Assistive device: Hemi-walker (Left rigid AFO; sock over forefoot of boot) Gait Pattern/deviations: Step-to pattern General Gait Details: 3-point step-to gait, good demonstration of maintaining HW at "1:30" position with FWD + lateral lean to facilitate passive LLE swing phase. Left knee only minimally flexed this date as AFO is better fit with foot fully in shoe; has 1 instance of full out buckling durign third AMB interval but AFO helps patient recover without physical assistance from author. AFO trial remains successful in assisting Left foot drop, abating Left knee inmstability. HR WNL this date, mid 80s at rest, peaking in low 100s during AMB, pt denies any dizziness today while up. Gait velocity: 0.70m/s    ADL: ADL Overall ADL's : Needs assistance/impaired Eating/Feeding: Modified independent Eating/Feeding Details (indicate cue type and reason): with dom RUE Grooming: Wash/dry face,Sitting,Moderate assistance Grooming Details (indicate cue type and reason): MOD A  for LUE support at the elbow with pt supporting L wrist/hand with his RUE to bring washcloth to face incorporating BUE into task General ADL Comments: Anticipate MOD-MAX A for LB ADL and UB ADL in seated position given L side impairments  Cognition: Cognition Overall Cognitive Status: Within Functional Limits for tasks assessed Orientation Level: Oriented X4 Cognition Arousal/Alertness: Awake/alert Behavior During Therapy: WFL for tasks assessed/performed Overall Cognitive Status: Within Functional Limits for tasks assessed General Comments: eager to participate throughout, excited to share with therapist that the case mgr told him he's going to CIR tomorrow   Blood pressure (!) 134/92, pulse 77, temperature 98.8 F (37.1 C), temperature source Oral, resp. rate 16, height 5\' 7"  (1.702 m), weight 108.2 kg, SpO2 99 %. Physical Exam Vitals reviewed.  Constitutional:      General: He is not in acute distress.    Appearance: He is obese.  HENT:     Head: Normocephalic and atraumatic.     Right Ear: External ear normal.     Left Ear: External ear normal.     Nose: Nose normal.  Eyes:     General:        Right eye: No discharge.        Left eye: No discharge.     Extraocular Movements: Extraocular movements intact.  Cardiovascular:     Rate and Rhythm: Normal rate and regular rhythm.  Pulmonary:     Effort: Pulmonary effort is normal. No respiratory distress.  Breath sounds: No stridor.  Abdominal:     General: Abdomen is flat.     Comments: Hypoactive bowel sounds  Musculoskeletal:     Cervical back: Normal range of motion and neck supple.     Comments: No edema or tenderness in extremities  Skin:    General: Skin is warm and dry.  Neurological:     Mental Status: He is alert.     Comments: Alert Dysarthria Motor: RUE/RLE: 4+-5/5 proximal distal LUE: Shoulder abduction, elbow flexion 3 -/5, distally 0/5 LLE: Hip flexion, knee extension 3/5, ankle dorsiflexion  0/5 Sensation intact light touch  Psychiatric:        Mood and Affect: Affect is blunt and flat.        Speech: Speech is delayed and slurred.     No results found for this or any previous visit (from the past 48 hour(s)). No results found.     Medical Problem List and Plan: 1.  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  -patient may shower  -ELOS/Goals: 12 to 16 days/supervision/min a  Admit to CIR 2.  Antithrombotics: -DVT/anticoagulation:  Pharmaceutical: Lovenox  -antiplatelet therapy: Low dose ASA.  3. Pain Management: Voltaren gel for left shoulder subluxation.   Moderate increased exertion 4. Mood: LCSW to follow for evaluation and support.   -antipsychotic agents: N/A 5. Neuropsych: This patient is capable of making decisions on his own behalf. 6. Skin/Wound Care: Routine pressure relief measures 7. Fluids/Electrolytes/Nutrition: Monitor I/Os. Encourage fluid intake.  8. HTN: Monitor BP tid--continue HCTZ, Norvasc, and metoprolol  Monitor with increased mobility 9. CKD III: Will monitor renal status with serial checks  --avoid hypoperfusion  --BUN/SCr 21/1.31 at admission-->34/1.42.   CMP ordered for tomorrow 10. Dyslipidemia: Lipitor 11. Impaired fasting glucose:  Hgb A1c-4.7  -- Elevated fasting BS likely due to stress.  Moderate increased mobility 12. Tobacco abuse  Counsel  Jacquelynn Cree, [place] 09/14/2020  I have personally performed a face to face diagnostic evaluation, including, but not limited to relevant history and physical exam findings, of this patient and developed relevant assessment and plan.  Additionally, I have reviewed and concur with the physician assistant's documentation above.  Maryla Morrow, MD, ABPMR  The patient's status has not changed. Any changes from the pre-admission screening or documentation from the acute  chart are noted above.   Maryla Morrow, MD, ABPMR

## 2020-09-14 NOTE — H&P (Addendum)
Physical Medicine and Rehabilitation Admission H&P    Chief Complaint  Patient presents with  . Stroke with functional deficits    HPI: Darryl Sullivan is a 55 year old male in relatively good health who was admitted on 08/28/2020 to [facility] with left hemiparesis and elevated blood pressure.  UDS negative.  History taken from chart review and patient due to slowed cognition. CT head unremarkable for acute intracranial process and patient received TPA.  CTA head/neck was negative for LVO or stenosis. MRI brain on 09/05/2020 discussed and reviewed with Neurology, showing white matter and basal ganglia infarcts on right. Echocardiogram with ejection fraction of 60-65% with moderate LVH.  Follow up CT personally reviewed, showing small left basal ganglia hemorrhage, likely hemorrhagic conversion. Neurology recommended low dose ASA for stroke due to small vessel disease and outpatient sleep study to rule out OSA.  Hospital course further complicated by AKI and renal ultrasound performed, showing increased cortical echogenicity c/w medical renal disease.  Meds titrated for BP control and metoprolol added tachycardia with activity. He continues to be limited by LUE/LLE weakness affecting ADLs as well as mobility. CIR recommended due to functional decline.  Please see preadmission assessment from earlier today as well.  Review of Systems  Constitutional: Negative for chills and fever.  HENT: Negative for hearing loss.   Eyes: Negative for blurred vision and double vision.  Respiratory: Negative for cough and shortness of breath.   Cardiovascular: Negative for chest pain and palpitations.  Gastrointestinal: Negative for constipation, heartburn and nausea.  Musculoskeletal: Negative for joint pain and myalgias.  Skin: Negative for itching and rash.  Neurological: Positive for speech change, focal weakness and weakness. Negative for dizziness, sensory change and headaches.  Psychiatric/Behavioral: The  patient is not nervous/anxious and does not have insomnia.   All other systems reviewed and are negative.  Has medical history: CKD, HTN  Past Surgical History:  Procedure Laterality Date  . NO PAST SURGERIES      Family History  Problem Relation Age of Onset  . Diabetes Mother     Social History:  reports that he has been smoking cigarettes. He has been smoking about 0.25 packs per day. His smokeless tobacco use includes chew. He reports that he does not drink alcohol and does not use drugs.   Allergies  Allergen Reactions  . Penicillins Hives  . Tramadol Hives   Medications Prior to Admission  Medication Sig Dispense Refill  . acetaminophen (TYLENOL) 325 MG tablet Take 2 tablets (650 mg total) by mouth every 4 (four) hours as needed for mild pain (or temp > 37.5 C (99.5 F)). 60 tablet 0  . amLODipine (NORVASC) 10 MG tablet Take 1 tablet (10 mg total) by mouth daily. 30 tablet 0  . aspirin EC 81 MG EC tablet Take 1 tablet (81 mg total) by mouth daily. Swallow whole. 30 tablet 0  . atorvastatin (LIPITOR) 20 MG tablet Take 4 tablets (80 mg total) by mouth at bedtime. 30 tablet 0  . [START ON 09/15/2020] hydrochlorothiazide (HYDRODIURIL) 25 MG tablet Take 1 tablet (25 mg total) by mouth daily. 30 tablet 0  . metoprolol tartrate (LOPRESSOR) 25 MG tablet Take 1 tablet (25 mg total) by mouth 2 (two) times daily. 30 tablet 0  . pantoprazole (PROTONIX) 40 MG tablet Take 1 tablet (40 mg total) by mouth at bedtime. 30 tablet 0    Drug Regimen Review  Drug regimen was reviewed and remains appropriate with no significant issues  identified  Home: Home Living Family/patient expects to be discharged to:: Inpatient rehab Living Arrangements: Other relatives (nephew and nephew's wife) Available Help at Discharge: Family,Available PRN/intermittently Type of Home: House Home Access: Stairs to enter [employer] of Steps: a few steps with rail Home Layout: One level Home Equipment:  None   Functional History: Prior Function Level of Independence: Independent Comments: Pt independent, working in [employer], multimedia, 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  Functional Status:  Mobility: Bed Mobility Overal bed mobility: Needs Assistance Bed Mobility: Sit to Supine Rolling: Supervision Sidelying to sit: Min assist Supine to sit: Min assist,Mod assist Sit to supine: Min assist Sit to sidelying: Min guard General bed mobility comments: Min-Mod A for LLE mgt back to bed, does a good job of protecting his LUE without cues Transfers Overall transfer level: Needs assistance Equipment used: Hemi-walker Transfers: Sit to/from Stand Sit to Stand: Min assist Stand pivot transfers: Mod assist  Lateral/Scoot Transfers: Mod assist,Min assist General transfer comment: cues for force through [facility] Ambulation/Gait Ambulation/Gait assistance: Min guard Gait Distance (Feet): 20 Feet [phone] (wants to take it easier today due to fatigue)) Assistive device: Hemi-walker (Left rigid AFO; sock over forefoot of boot) Gait Pattern/deviations: Step-to pattern General Gait Details: 3-point step-to gait, good demonstration of maintaining HW at "1:30" position with FWD + lateral lean to facilitate passive LLE swing phase. Left knee only minimally flexed this date as AFO is better fit with foot fully in shoe; has 1 instance of full out buckling durign third AMB interval but AFO helps patient recover without physical assistance from author. AFO trial remains successful in assisting Left foot drop, abating Left knee inmstability. HR WNL this date, mid 80s at rest, peaking in low 100s during AMB, pt denies any dizziness today while up. Gait velocity: 0.70m/s    ADL: ADL Overall ADL's : Needs assistance/impaired Eating/Feeding: Modified independent Eating/Feeding Details (indicate cue type and reason): with dom RUE Grooming: Wash/dry face,Sitting,Moderate assistance Grooming Details (indicate cue type and reason): MOD A  for LUE support at the elbow with pt supporting L wrist/hand with his RUE to bring washcloth to face incorporating BUE into task General ADL Comments: Anticipate MOD-MAX A for LB ADL and UB ADL in seated position given L side impairments  Cognition: Cognition Overall Cognitive Status: Within Functional Limits for tasks assessed Orientation Level: Oriented X4 Cognition Arousal/Alertness: Awake/alert Behavior During Therapy: WFL for tasks assessed/performed Overall Cognitive Status: Within Functional Limits for tasks assessed General Comments: eager to participate throughout, excited to share with therapist that the case mgr told him he's going to CIR tomorrow   Blood pressure (!) 134/92, pulse 77, temperature 98.8 F (37.1 C), temperature source Oral, resp. rate 16, height 5\' 7"  (1.702 m), weight 108.2 kg, SpO2 99 %. Physical Exam Vitals reviewed.  Constitutional:      General: He is not in acute distress.    Appearance: He is obese.  HENT:     Head: Normocephalic and atraumatic.     Right Ear: External ear normal.     Left Ear: External ear normal.     Nose: Nose normal.  Eyes:     General:        Right eye: No discharge.        Left eye: No discharge.     Extraocular Movements: Extraocular movements intact.  Cardiovascular:     Rate and Rhythm: Normal rate and regular rhythm.  Pulmonary:     Effort: Pulmonary effort is normal. No respiratory distress.  Breath sounds: No stridor.  Abdominal:     General: Abdomen is flat.     Comments: Hypoactive bowel sounds  Musculoskeletal:     Cervical back: Normal range of motion and neck supple.     Comments: No edema or tenderness in extremities  Skin:    General: Skin is warm and dry.  Neurological:     Mental Status: He is alert.     Comments: Alert Dysarthria Motor: RUE/RLE: 4+-5/5 proximal distal LUE: Shoulder abduction, elbow flexion 3 -/5, distally 0/5 LLE: Hip flexion, knee extension 3/5, ankle dorsiflexion  0/5 Sensation intact light touch  Psychiatric:        Mood and Affect: Affect is blunt and flat.        Speech: Speech is delayed and slurred.     No results found for this or any previous visit (from the past 48 hour(s)). No results found.     Medical Problem List and Plan: 1.  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  -patient may shower  -ELOS/Goals: 12 to 16 days/supervision/min a  Admit to CIR 2.  Antithrombotics: -DVT/anticoagulation:  Pharmaceutical: Lovenox  -antiplatelet therapy: Low dose ASA.  3. Pain Management: Voltaren gel for left shoulder subluxation.   Moderate increased exertion 4. Mood: LCSW to follow for evaluation and support.   -antipsychotic agents: N/A 5. Neuropsych: This patient is capable of making decisions on his own behalf. 6. Skin/Wound Care: Routine pressure relief measures 7. Fluids/Electrolytes/Nutrition: Monitor I/Os. Encourage fluid intake.  8. HTN: Monitor BP tid--continue HCTZ, Norvasc, and metoprolol  Monitor with increased mobility 9. CKD III: Will monitor renal status with serial checks  --avoid hypoperfusion  --BUN/SCr 21/1.31 at admission-->34/1.42.   CMP ordered for tomorrow 10. Dyslipidemia: Lipitor 11. Impaired fasting glucose:  Hgb A1c-4.7  -- Elevated fasting BS likely due to stress.  Moderate increased mobility 12.  Tobacco abuse  Consult  Jacquelynn Cree, PA-C 09/14/2020  I have personally performed a face to face diagnostic evaluation, including, but not limited to relevant history and physical exam findings, of this patient and developed relevant assessment and plan.  Additionally, I have reviewed and concur with the physician assistant's documentation above.  Maryla Morrow, MD, ABPMR

## 2020-09-14 NOTE — Discharge Summary (Signed)
Physician Discharge Summary  Patient ID: Darryl Sullivan MRN: [id number] DOB/AGE: 13-Feb-1966 55 y.o.  Admit date: 09/05/2020 Discharge date: 09/14/2020  Admission Diagnoses:  Discharge Diagnoses:  Principal Problem:   Cerebrovascular accident (CVA) [facility]) Active Problems:   Hypertensive emergency   Discharged Condition: good  Hospital Course:  Darryl Sullivan is a 55 y.o. male with medical history significant for obesity, tobacco use disorder, hypertension, who was brought to the hospital because of left-sided weakness and presyncope.[name]  He was found to have acute right basal ganglia and left temporal white matter infarct/stroke.  He had mild aphasia on admission.  He was given TPA.  Neurologist recommended aspirin at discharge.  He was evaluated by PT and OT recommended further rehabilitation at the inpatient rehab center.  Blood pressure has improved with antihypertensives.  He was started on metoprolol because of exertional sinus tachycardia.  Follow-up with cardiologist as recommended for ambulatory cardiac event monitor.  #1. Acute stroke status post TPA with a small hemorrhagic conversion. Patient condition had improved, will continue treatment with aspirin and Lipitor. Patient is transfer to CIR for acute rehab.  He need to be seen by cardiology after discharge to set up for heart monitor.  2.  Dyslipidemia. Continue Lipitor  3.  Chronic kidney disease stage IIIa. Stable.  4.  Morbid obesity.   Consults: neurology  Significant Diagnostic Studies: MRI HEAD WITHOUT CONTRAST  TECHNIQUE: Multiplanar, multiecho pulse sequences of the brain and surrounding structures were obtained without intravenous contrast.  COMPARISON:  None.  FINDINGS: Brain: There are areas abnormal diffusion restriction within the right basal ganglia and left temporal white matter. No acute hemorrhage. Remote hemorrhage in the left basal ganglia. Old infarcts of the left thalamus and basal ganglia  and within the brainstem. There is multifocal hyperintense T2-weighted signal within the white matter. Parenchymal volume and CSF spaces are normal. The midline structures are normal.  Vascular: Major flow voids are preserved.  Skull and upper cervical spine: Normal calvarium and skull base. Visualized upper cervical spine and soft tissues are normal.  Sinuses/Orbits:No paranasal sinus fluid levels or advanced mucosal thickening. No mastoid or middle ear effusion. Normal orbits.  IMPRESSION: 1. Small areas of acute ischemia within the right basal ganglia and left temporal white matter. No hemorrhage or mass effect. 2. Multiple old small vessel infarcts and findings of chronic microvascular ischemia.   Electronically Signed   By: Deatra Robinson M.D.   On: 09/06/2020 00:10  CT HEAD WITHOUT CONTRAST  TECHNIQUE: Contiguous axial images were obtained from the base of the skull through the vertex without intravenous contrast.  COMPARISON:  09/06/2020  FINDINGS: Brain: Small focus of hyperdensity is again identified at the posterior aspect of the left putamen, which was not present on 09/05/2020. There is hypoattenuation within the right basal ganglia and adjacent white matter corresponding to acute infarction on MRI. Additional punctate left temporal infarct on MRI is not seen due to size. Stable findings of chronic microvascular ischemic changes. Small chronic infarct of the right centrum semiovale. Ventricles are stable in size.  Vascular: No new findings.  Skull: Calvarium is unremarkable.  Sinuses/Orbits: No acute finding.  Other: None.  IMPRESSION: Stable punctate left basal ganglia hemorrhage.  Evolving recent infarction of right basal ganglia and adjacent white matter.   Electronically Signed   By: Guadlupe Spanish M.D.   On: 09/08/2020 12:58  Treatments:Asprin, statin  Discharge Exam: Blood pressure (!) 148/92, pulse 80, temperature 98.2  F (36.8 C), temperature source Oral,  resp. rate 16, height 5\' 7"  (1.702 m), weight 108.2 kg, SpO2 99 %. General appearance: alert and cooperative Resp: clear to auscultation bilaterally Cardio: regular rate and rhythm, S1, S2 normal, no murmur, click, rub or gallop GI: soft, non-tender; bowel sounds normal; no masses,  no organomegaly Extremities: extremities normal, atraumatic, no cyanosis or edema  Disposition: Discharge disposition: 62-Rehab Facility       Discharge Instructions    Ambulatory referral to Neurology   Complete by: As directed    An appointment is requested in approximately: 2-4 wks after discharge from rehab   Diet - low sodium heart healthy   Complete by: As directed    Increase activity slowly   Complete by: As directed      Allergies as of 09/14/2020      Reactions   Penicillins Hives   Tramadol Hives      Medication List    TAKE these medications   acetaminophen 325 MG tablet Commonly known as: TYLENOL Take 2 tablets (650 mg total) by mouth every 4 (four) hours as needed for mild pain (or temp > 37.5 C (99.5 F)).   amLODipine 10 MG tablet Commonly known as: NORVASC Take 1 tablet (10 mg total) by mouth daily.   aspirin 81 MG EC tablet Take 1 tablet (81 mg total) by mouth daily. Swallow whole.   atorvastatin 20 MG tablet Commonly known as: LIPITOR Take 4 tablets (80 mg total) by mouth at bedtime.   hydrochlorothiazide 25 MG tablet Commonly known as: HYDRODIURIL Take 1 tablet (25 mg total) by mouth daily. Start taking on: September 15, 2020   metoprolol tartrate 25 MG tablet Commonly known as: LOPRESSOR Take 1 tablet (25 mg total) by mouth 2 (two) times daily.   pantoprazole 40 MG tablet Commonly known as: PROTONIX Take 1 tablet (40 mg total) by mouth at bedtime.       Follow-up Information    [facility]. Schedule an appointment as soon as possible for a visit in 1 week(s).   Specialty: Cardiology Why: For event monitor  s/p acute stroke Contact information: [address], [address] [place] [phone]       [facility]. Schedule an appointment as soon as possible for a visit in 1 month(s).   Contact information: [address] [place] [phone]             34 minutes Signed: [phone] 09/14/2020, 9:30 AM

## 2020-09-14 NOTE — Progress Notes (Signed)
Inpatient Rehabilitation Medication Review by a Pharmacist  A complete drug regimen review was completed for this patient to identify any potential clinically significant medication issues.  Clinically significant medication issues were identified: No  Check AMION for pharmacist assigned to patient if future medication questions/issues arise during this admission.  Time spent performing this drug regimen review (minutes):  15  Vicki Mallet, PharmD, BCPS, [facility] Clinical Pharmacist 09/14/2020 2:04 PM

## 2020-09-14 NOTE — Progress Notes (Signed)
Jamse Arn, MD  Physician  Physical Medicine and Rehabilitation  PMR Pre-admission     Addendum  Date of Service:  09/14/2020 9:59 AM      Related encounter: ED to Hosp-Admission (Discharged) from 09/05/2020 in [place] (1C)          Show:Clear all [x]Manual[x]Template[x]Copied  Added by: [x]Warren, Earnest Conroy, PT   []Hover for details  PMR Admission Coordinator Pre-Admission Assessment  Patient: Darryl Sullivan is an 55 y.o., male MRN: [id number] DOB: 13-Jun-1965 Height: 5' 7" (170.2 cm) Weight: 108.2 kg                                                                                                                                                  Insurance Information HMO:     PPO: yes     PCP:      IPA:      80/20:      OTHER:  PRIMARYRickey Primus      Policy#: [id number]      Subscriber:  CM Name: Ace      Phone#: [phone]     Fax#: [phone] Pre-Cert#: [id number] auth for CIR given by Ace with [employer] with update due to fax listed above on 4/11      Employer:  Benefits:  Phone #: [phone]     Name:  Eff. Date: 06/11/20     Deduct: $2800 ($0 met)      Out of Pocket Max: [phone] ($0 met)      Life Max: n/a  CIR: 80%      SNF: 80% Outpatient: 80%     Co-Ins: 20% Home Health: not covered      Co-Pay:  DME: 80%     Co-Ins: 20% Providers:  SECONDARY:       Policy#:       Phone#:   Development worker, community:       Phone#:   The Therapist, art Information Summary" for patients in Inpatient Rehabilitation Facilities with attached "Privacy Act [place] Records" was provided and verbally reviewed with: N/A  Emergency Contact Information         Contact Information    Name Relation Home Work [place] Daughter   [phone]   Raye Sorrow [phone]  [phone]   Emelia Loron [phone]  [phone]     Current Medical History  Patient Admitting Diagnosis:  CVA   History of Present Illness: Pt is a 55 y/o male with PMH of obesity, tobacco use, and HTN who was admitted to [facility] on 3/28 with presyncope and L sided weakness.  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  Mild aphasia present on admission and pt was given tPA.  Neurology was consulted and recommended aspirin on discharge.  BP improved  on antihypertensives and metoprolol was started due to tachhycardia with exertion.  Cardiology recommended outpatient follow up for event monitor.  Therapy evaluations were completed and pt was recommended for CIR.  Complete NIHSS TOTAL: 5 Glasgow Coma Scale Score: 15  Past Medical History  No past medical history on file.  Family History  family history includes Diabetes in his mother.  Prior Rehab/Hospitalizations:  Has the patient had prior rehab or hospitalizations prior to admission? No  Has the patient had major surgery during 100 days prior to admission? No  Current Medications   Current Facility-Administered Medications:  .  acetaminophen (TYLENOL) tablet 650 mg, 650 mg, Oral, Q4H PRN **OR** acetaminophen (TYLENOL) 160 MG/5ML solution 650 mg, 650 mg, Per Tube, Q4H PRN **OR** acetaminophen (TYLENOL) suppository 650 mg, 650 mg, Rectal, Q4H PRN, Jennye Boroughs, MD .  amLODipine (NORVASC) tablet 10 mg, 10 mg, Oral, Daily, Jennye Boroughs, MD, 10 mg at 09/14/20 0925 .  aspirin EC tablet 81 mg, 81 mg, Oral, Daily, Jennye Boroughs, MD, 81 mg at 09/14/20 0924 .  atorvastatin (LIPITOR) tablet 80 mg, 80 mg, Oral, QHS, Jennye Boroughs, MD, 80 mg at 09/13/20 2141 .  diclofenac Sodium (VOLTAREN) 1 % topical gel 2 g, 2 g, Topical, QID PRN, Raulkar, Clide Deutscher, MD, 2 g at 09/13/20 2144 .  enoxaparin (LOVENOX) injection 40 mg, 40 mg, Subcutaneous, Q24H, Jennye Boroughs, MD, 40 mg at 09/13/20 2142 .  hydrochlorothiazide (HYDRODIURIL) tablet 25 mg, 25 mg, Oral, Daily, Jennye Boroughs, MD, 25 mg at 09/14/20 0925 .  labetalol  (NORMODYNE) injection 10 mg, 10 mg, Intravenous, Q10 min PRN, Jennye Boroughs, MD, 10 mg at 09/09/20 1409 .  MEDLINE mouth rinse, 15 mL, Mouth Rinse, BID, Jennye Boroughs, MD, 15 mL at 09/14/20 0927 .  metoprolol tartrate (LOPRESSOR) tablet 25 mg, 25 mg, Oral, BID, Jennye Boroughs, MD, 25 mg at 09/14/20 0926 .  pantoprazole (PROTONIX) EC tablet 40 mg, 40 mg, Oral, QHS, Jennye Boroughs, MD, 40 mg at 09/13/20 2140 .  senna-docusate (Senokot-S) tablet 1 tablet, 1 tablet, Oral, QHS PRN, Jennye Boroughs, MD  Patients Current Diet:     Diet Order                  Diet - low sodium heart healthy            Diet Heart Room service appropriate? Yes; Fluid consistency: Thin  Diet effective now                  Precautions / Restrictions Precautions Precautions: Fall Precaution Comments: Left foot drop c spastic calf Restrictions Weight Bearing Restrictions: No   Has the patient had 2 or more falls or a fall with injury in the past year?No  Prior Activity Level Community (5-7x/wk): independent prior to admit, working full time as an Radio producer Level Prior Function Level of Independence: Independent Comments: Pt independent, working in Ambulance person, no falls  Self Care: Did the patient need help bathing, dressing, using the toilet or eating?  Independent  Indoor Mobility: Did the patient need assistance with walking from room to room (with or without device)? Independent  Stairs: Did the patient need assistance with internal or external stairs (with or without device)? Independent  Functional Cognition: Did the patient need help planning regular tasks such as shopping or remembering to take medications? Independent  Home Assistive Devices / Equipment Home Equipment: None  Prior Device Use: Indicate devices/aids used by the patient prior to current illness, exacerbation  or injury? None of the above  Current Functional Level Cognition   Overall Cognitive Status: Within Functional Limits for tasks assessed Orientation Level: Oriented X4 General Comments: eager to participate throughout, excited to share with therapist that the case mgr told him he's going to CIR tomorrow    Extremity Assessment (includes Sensation/Coordination)  Upper Extremity Assessment: LUE deficits/detail (RUE WFL) LUE Deficits / Details: shoulder flexion 2+/5, shoulder abduction 2/5, elbow flex/ext in GE position 2-/5, composite finger flexion 3+ to 4-/5, sensation grossly intact LUE Coordination: decreased gross motor,decreased fine motor  Lower Extremity Assessment: LLE deficits/detail (RLE WFL) LLE Deficits / Details: at least 3/5, difficulty sustaining muscle activation during partial STS, sensation grossly intact LLE Sensation: decreased light touch LLE Coordination: decreased fine motor,decreased gross motor    ADLs  Overall ADL's : Needs assistance/impaired Eating/Feeding: Modified independent Eating/Feeding Details (indicate cue type and reason): with dom RUE Grooming: Wash/dry face,Sitting,Moderate assistance Grooming Details (indicate cue type and reason): MOD A for LUE support at the elbow with pt supporting L wrist/hand with his RUE to bring washcloth to face incorporating BUE into task General ADL Comments: Anticipate MOD-MAX A for LB ADL and UB ADL in seated position given L side impairments    Mobility  Overal bed mobility: Needs Assistance Bed Mobility: Sit to Supine Rolling: Supervision Sidelying to sit: Min assist Supine to sit: Min assist,Mod assist Sit to supine: Min assist Sit to sidelying: Min guard General bed mobility comments: Min-Mod A for LLE mgt back to bed, does a good job of protecting his LUE without cues    Transfers  Overall transfer level: Needs assistance Equipment used: Hemi-walker Transfers: Sit to/from Stand Sit to Stand: Min assist Stand pivot transfers: Mod assist  Lateral/Scoot Transfers: Mod  assist,Min assist General transfer comment: cues for force through [facility]    Ambulation / Gait / Stairs / Wheelchair Mobility  Ambulation/Gait Ambulation/Gait assistance: Min guard Gait Distance (Feet): 20 Feet [phone] (wants to take it easier today due to fatigue)) Assistive device: Hemi-walker (Left rigid AFO; sock over forefoot of boot) Gait Pattern/deviations: Step-to pattern General Gait Details: 3-point step-to gait, good demonstration of maintaining HW at "1:30" position with FWD + lateral lean to facilitate passive LLE swing phase. Left knee only minimally flexed this date as AFO is better fit with foot fully in shoe; has 1 instance of full out buckling durign third AMB interval but AFO helps patient recover without physical assistance from author. AFO trial remains successful in assisting Left foot drop, abating Left knee inmstability. HR WNL this date, mid 80s at rest, peaking in low 100s during AMB, pt denies any dizziness today while up. Gait velocity: 0.66ms    Posture / Balance Dynamic Sitting Balance Sitting balance - Comments: Pt tolerated sitting EOB >242m with good static sitting balance and requiring MIN A for dynamic balance outside BOS while RUE reaching for various items on tray, CGA for R and L lateral leans Balance Overall balance assessment: Needs assistance Sitting-balance support: Feet supported,No upper extremity supported Sitting balance-Leahy Scale: Good Sitting balance - Comments: Pt tolerated sitting EOB >2536mwith good static sitting balance and requiring MIN A for dynamic balance outside BOS while RUE reaching for various items on tray, CGA for R and L lateral leans Standing balance support: Single extremity supported,During functional activity Standing balance-Leahy Scale: Poor Standing balance comment: with AFO, HW, pt has much improved capcity for upright stance without LOB today.    Special needs/care consideration n/a  Previous Home  Environment (from acute therapy documentation) Living Arrangements: Other relatives (nephew and nephew's wife) Available Help at Discharge: Family,Available PRN/intermittently Type of Home: House Home Layout: One level Home Access: Stairs to enter [employer] of Steps: a few steps with rail  Discharge Living Setting Plans for Discharge Living Setting: Lives with (comment) (nephew and his spouse) Type of Home at Discharge: Mobile home Discharge Home Layout: One level Discharge Home Access: Stairs to enter Entrance Stairs-Rails: Right Entrance Stairs-Number of Steps: 4-5 Discharge Bathroom Shower/Tub: Tub/shower unit Discharge Bathroom Toilet: Standard Discharge Bathroom Accessibility: Yes How Accessible: Accessible via walker Does the patient have any problems obtaining your medications?: No  Social/Family/Support Systems Anticipated Caregiver: nephew Georgina Snell) Anticipated Caregiver's Contact Information: [phone] Ability/Limitations of Caregiver: works full time Careers adviser: Evenings only Discharge Plan Discussed with Primary Caregiver: Yes Is Caregiver In Agreement with Plan?: Yes Does Caregiver/Family have Issues with Lodging/Transportation while Pt is in Rehab?: No   Goals Patient/Family Goal for Rehab: PT/OT mod I, SLP n/a Expected length of stay: 8-11 days Additional Information: will need to be mod I to return home with family Pt/Family Agrees to Admission and willing to participate: Yes Program Orientation Provided & Reviewed with Pt/Caregiver Including Roles  & Responsibilities: Yes  Barriers to Discharge: Insurance for SNF coverage   Decrease burden of Care through IP rehab admission: n/a   Possible need for SNF placement upon discharge:not anticipated.  Pt with mod I goals.    Patient Condition: I have reviewed medical records from [facility], spoken with CM, and patient. I discussed via phone for inpatient rehabilitation assessment.   Patient will benefit from ongoing PT and OT, can actively participate in 3 hours of therapy a day 5 days of the week, and can make measurable gains during the admission.  Patient will also benefit from the coordinated team approach during an Inpatient Acute Rehabilitation admission.  The patient will receive intensive therapy as well as Rehabilitation physician, nursing, social worker, and care management interventions.  Due to safety, medication administration, pain management and patient education the patient requires 24 hour a day rehabilitation nursing.  The patient is currently min assist with mobility and basic ADLs.  Discharge setting and therapy post discharge at home with outpatient is anticipated.  Patient has agreed to participate in the Acute Inpatient Rehabilitation Program and will admit today.  Preadmission Screen Completed By:  Michel Santee, PT, DPT 09/14/2020 10:00 AM ______________________________________________________________________   Discussed status with Dr. Posey Pronto on 09/14/20 at 10:07 AM  and received approval for admission today.  Admission Coordinator:  Michel Santee, PT, DPT time 10:07 AM Sudie Grumbling 09/14/20          Revision History                             Note Details  Author Jamse Arn, MD File Time 09/14/2020 10:46 AM  Author Type Physician Status Addendum  Last Editor Jamse Arn, MD Service Physical Medicine and [place] # [id number] Admit Date 09/14/2020

## 2020-09-14 NOTE — Progress Notes (Signed)
Inpatient Rehabilitation  Patient information reviewed and entered into eRehab system by Ludwin Flahive M. Calogero Geisen, M.A., CCC/SLP, PPS Coordinator.  Information including medical coding, functional ability and quality indicators will be reviewed and updated through discharge.    

## 2020-09-14 NOTE — Progress Notes (Addendum)
Patient ID: Darryl Sullivan, male   DOB: 08-09-1965, 55 y.o.   MRN: [id number] Met with the patient to review role of the nurse CM and collaboration with the SW to facilitate preparation for discharge. Reviewed team conference, therapy schedule and plan of care and secondary stroke risks to be addressed during his stay on rehab. Discussed HTN, HLD with LDL of 165 and smoking cessation and exercise. Reviewed HH, low salt and fat modified diet. Patient given handouts and educational booklets for reference at discharge. Continue to follow along to discharge to address questions and education. Patient noted he lived alone PTA and will ask his ex-wife to assist at discharge and will need a PCP referral in [place] made aware of discharge destination pending and PCP need.  Margarito Liner

## 2020-09-14 NOTE — TOC Transition Note (Signed)
Transition of Care [facility]) - CM/SW Discharge Note   Patient Details  Name: Darryl Sullivan MRN: [id number] Date of Birth: May 24, 1966  Transition of Care [facility]) CM/SW Contact:  Allayne Butcher, RN Phone Number: 09/14/2020, 10:46 AM   Clinical Narrative:    Patient will discharge to CIR today.  Patient updated on discharge plan.  CIR will arrange [facility] transport.  [facility] packet is ready.    Final next level of care: IP Rehab Facility Barriers to Discharge: Barriers Resolved   Patient Goals and CMS Choice Patient states their goals for this hospitalization and ongoing recovery are:: Patient excited to go to CIR CMS Medicare.gov Compare Post Acute Care list provided to:: Patient Choice offered to / list presented to : Patient  Discharge Placement              Patient chooses bed at: Other - please specify in the comment section below: ([facility] inpatient rehab) Patient to be transferred to facility by: [facility] Name of family member notified: CIR to call family Patient and family notified of of transfer: 09/14/20  Discharge Plan and Services   Discharge Planning Services: CM Consult Post Acute Care Choice: IP Rehab          DME Arranged: N/A DME Agency: NA       HH Arranged: NA          Social Determinants of Health (SDOH) Interventions     Readmission Risk Interventions No flowsheet data found.

## 2020-09-14 NOTE — Progress Notes (Signed)
Horton Chin, MD  Physician  Physical Medicine and Rehabilitation  Consult Note     Signed  Date of Service:  09/13/2020 3:58 PM      Related encounter: ED to Hosp-Admission (Discharged) from 09/05/2020 in [facility] (1C)       Signed      Expand All Collapse All    Show:Clear all [x] Manual[x] Template[] Copied  Added by: [x] Raulkar, , MD   [] Hover for details       Physical Medicine and Rehabilitation Consult Reason for Consult: CVA Referring Physician: , MD   HPI: Darryl Sullivan is a 55 y.o. male with PMH of tobacco abuse who [facility] to [facility] ED on 3/28 with left-sided weakness and presyncope that he developed at work that morning. CT showed small area of ischemia in the R basal ganglia and L temporal white matter. Physical Medicine & Rehabilitation was consulted to assess candidacy for CIR.    Review of Systems  Constitutional: Negative.   HENT: Negative.   Eyes: Negative.   Respiratory: Negative.   Cardiovascular: Negative.   Gastrointestinal: Negative.   Genitourinary: Negative.   Musculoskeletal: Positive for joint pain.  Skin: Negative.   Neurological: Positive for focal weakness and weakness. Negative for sensory change.  Endo/Heme/Allergies: Negative.   Psychiatric/Behavioral: Negative.    No past medical history on file.      Past Surgical History:  Procedure Laterality Date  . NO PAST SURGERIES          Family History  Problem Relation Age of Onset  . Diabetes Mother    Social History:  reports that he has been smoking cigarettes. He has been smoking about 0.25 packs per day. His smokeless tobacco use includes chew. He reports that he does not drink alcohol and does not use drugs. Allergies:      Allergies  Allergen Reactions  . Penicillins Hives  . Tramadol Hives   No medications prior to admission.    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 Functional Status:  Mobility: Bed Mobility Overal bed mobility: Needs Assistance Bed Mobility: Sit to Supine Rolling: Supervision Sidelying to sit: Min assist Supine to sit: Min assist,Mod assist Sit to supine: Min assist Sit to sidelying: Min guard General bed mobility comments: Min-Mod A for LLE mgt back to bed, does a good job of protecting his LUE without cues Transfers Overall transfer level: Needs assistance Equipment used: Hemi-walker Transfers: Sit to/from Stand Sit to Stand: Min assist Stand pivot transfers: Mod assist  Lateral/Scoot Transfers: Mod assist,Min assist General transfer comment: cues for force through [facility] Ambulation/Gait Ambulation/Gait assistance: Min guard Gait Distance (Feet): 20 Feet [phone] (wants to take it easier today due to fatigue)) Assistive device: Hemi-walker (Left rigid AFO; sock over forefoot of boot) Gait Pattern/deviations: Step-to pattern General Gait Details: 3-point step-to gait, good demonstration of maintaining HW at "1:30" position with FWD + lateral lean to facilitate passive LLE swing phase. Left knee only minimally flexed this date as AFO is better fit with foot fully in shoe; has 1 instance of full out buckling durign third AMB interval but  AFO helps patient recover without physical assistance from author. AFO trial remains successful in assisting Left foot drop, abating Left knee inmstability. HR WNL this date, mid 80s at rest, peaking in low 100s during AMB, pt denies any dizziness today  while up. Gait velocity: 0.56m/s  ADL: ADL Overall ADL's : Needs assistance/impaired Eating/Feeding: Modified independent Eating/Feeding Details (indicate cue type and reason): with dom RUE Grooming: Wash/dry face,Sitting,Moderate assistance Grooming Details (indicate cue type and reason): MOD A for LUE support at the elbow with pt supporting L wrist/hand with his RUE to bring washcloth to face incorporating BUE into task General ADL Comments: Anticipate MOD-MAX A for LB ADL and UB ADL in seated position given L side impairments  Cognition: Cognition Overall Cognitive Status: Within Functional Limits for tasks assessed Orientation Level: Oriented X4 Cognition Arousal/Alertness: Awake/alert Behavior During Therapy: WFL for tasks assessed/performed Overall Cognitive Status: Within Functional Limits for tasks assessed General Comments: eager to participate throughout, excited to share with therapist that the case mgr told him he's going to CIR tomorrow  Blood pressure 110/69, pulse 79, temperature 98.2 F (36.8 C), resp. rate 18, height 5\' 7"  (1.702 m), weight 108.2 kg, SpO2 99 %. Physical Exam  Gen: no distress, normal appearing HEENT: oral mucosa pink and moist, NCAT Cardio: Reg rate Chest: normal effort, normal rate of breathing Abd: soft, non-distended Ext: no edema Psych: pleasant, normal affect Skin: intact Neuro: Alert and oriented x3.  Musculoskeletal: 5/5 strength right side. Left side with 3/5 EF, HF, KE, otherwise 0/5. Left shoulder subluxation  Lab Results Last 24 Hours  No results found for this or any previous visit (from the past 24 hour(s)).   Imaging Results (Last 48 hours)  No results found.     Assessment/Plan: Diagnosis: R basal ganglia infarction 1. Does the need for close, 24 hr/day medical supervision in concert with the patient's rehab needs make it unreasonable for this patient to be served in a less intensive setting? Yes 2. Co-Morbidities  requiring supervision/potential complications:  1. Tobacco smoker history 2. Severe left sided weakness 3. Left sided shoulder subluxation: voltaren gel ordered 4. Left sided wrist drop: WHO ordered 5. Left sided foot drop: AFO ordered.  6. Hypertensive emergency 3. Due to bladder management, bowel management, safety, skin/wound care, disease management, medication administration, pain management and patient education, does the patient require 24 hr/day rehab nursing? Yes 4. Does the patient require coordinated care of a physician, rehab nurse, therapy disciplines of PT, OT to address physical and functional deficits in the context of the above medical diagnosis(es)? Yes Addressing deficits in the following areas: balance, endurance, locomotion, strength, transferring, bowel/bladder control, bathing, dressing, feeding, grooming, toileting and psychosocial support 5. Can the patient actively participate in an intensive therapy program of at least 3 hrs of therapy per day at least 5 days per week? Yes 6. The potential for patient to make measurable gains while on inpatient rehab is excellent 7. Anticipated functional outcomes upon discharge from inpatient rehab are modified independent  with PT, modified independent with OT, independent with SLP. 8. Estimated rehab length of stay to reach the above functional goals is: 5-7 days 9. Anticipated discharge destination: Home 10. Overall Rehab/Functional Prognosis: excellent  RECOMMENDATIONS: This patient's condition is appropriate for continued rehabilitative care in the following setting: CIR Patient has agreed to participate in recommended program. Yes Note that insurance prior authorization may be required for reimbursement for recommended care.  Comment: Thank you for this consult. Admission coordinator to follow.  I have personally performed a face to face diagnostic evaluation, including, but not limited to relevant history and physical  exam findings, of this patient and developed relevant assessment and plan.  Additionally, I have reviewed and concur with the physician assistant's documentation above.  Sula Soda, MD  Horton Chin, MD 09/13/2020           Routing History              Note Details  Author Carlis Abbott, Drema Pry, MD File Time 09/13/2020 4:05 PM  Author Type Physician Status Signed  Last Editor Horton Chin, MD Service Physical Medicine and Rehabilitation  Hospital [facility] # [id number] Admit Date 09/14/2020

## 2020-09-15 DIAGNOSIS — I6381 Other cerebral infarction due to occlusion or stenosis of small artery: Secondary | ICD-10-CM

## 2020-09-15 LAB — CBC WITH DIFFERENTIAL/PLATELET
Abs Immature Granulocytes: 0.03 10*3/uL (ref 0.00–0.07)
Basophils Absolute: 0.1 10*3/uL (ref 0.0–0.1)
Basophils Relative: 1 %
Eosinophils Absolute: 0.2 10*3/uL (ref 0.0–0.5)
Eosinophils Relative: 2 %
HCT: 48.2 % (ref 39.0–52.0)
Hemoglobin: 15.2 g/dL (ref 13.0–17.0)
Immature Granulocytes: 0 %
Lymphocytes Relative: 33 %
Lymphs Abs: 2.5 10*3/uL (ref 0.7–4.0)
MCH: 28.3 pg (ref 26.0–34.0)
MCHC: 31.5 g/dL (ref 30.0–36.0)
MCV: 89.8 fL (ref 80.0–100.0)
Monocytes Absolute: 0.3 10*3/uL (ref 0.1–1.0)
Monocytes Relative: 4 %
Neutro Abs: 4.7 10*3/uL (ref 1.7–7.7)
Neutrophils Relative %: 60 %
Platelets: 271 10*3/uL (ref 150–400)
RBC: 5.37 MIL/uL (ref 4.22–5.81)
RDW: 11.5 % (ref 11.5–15.5)
WBC: 7.8 10*3/uL (ref 4.0–10.5)
nRBC: 0 % (ref 0.0–0.2)

## 2020-09-15 LAB — COMPREHENSIVE METABOLIC PANEL
ALT: 30 U/L (ref 0–44)
AST: 20 U/L (ref 15–41)
Albumin: 4.3 g/dL (ref 3.5–5.0)
Alkaline Phosphatase: 75 U/L (ref 38–126)
Anion gap: 9 (ref 5–15)
BUN: 57 mg/dL — ABNORMAL HIGH (ref 6–20)
CO2: 30 mmol/L (ref 22–32)
Calcium: 10.1 mg/dL (ref 8.9–10.3)
Chloride: 100 mmol/L (ref 98–111)
Creatinine, Ser: 2.17 mg/dL — ABNORMAL HIGH (ref 0.61–1.24)
GFR, Estimated: 35 mL/min — ABNORMAL LOW (ref >60)
Glucose, Bld: 112 mg/dL — ABNORMAL HIGH (ref 70–99)
Potassium: 3.7 mmol/L (ref 3.5–5.1)
Sodium: 139 mmol/L (ref 135–145)
Total Bilirubin: 1.4 mg/dL — ABNORMAL HIGH (ref 0.3–1.2)
Total Protein: 7.9 g/dL (ref 6.5–8.1)

## 2020-09-15 MED ORDER — SODIUM CHLORIDE 0.45 % IV SOLN: INTRAVENOUS | Status: DC

## 2020-09-15 NOTE — Plan of Care (Signed)
Problem: RH Balance Goal: LTG Patient will maintain dynamic sitting balance (PT) Description: LTG:  Patient will maintain dynamic sitting balance with assistance during mobility activities (PT) Flowsheets (Taken 09/15/2020 2251) LTG: Pt will maintain dynamic sitting balance during mobility activities with:: Supervision/Verbal cueing Goal: LTG Patient will maintain dynamic standing balance (PT) Description: LTG:  Patient will maintain dynamic standing balance with assistance during mobility activities (PT) Flowsheets (Taken 09/15/2020 2251) LTG: Pt will maintain dynamic standing balance during mobility activities with:: Contact Guard/Touching assist   Problem: Sit to Stand Goal: LTG:  Patient will perform sit to stand with assistance level (PT) Description: LTG:  Patient will perform sit to stand with assistance level (PT) Flowsheets (Taken 09/15/2020 2251) LTG: PT will perform sit to stand in preparation for functional mobility with assistance level: Contact Guard/Touching assist   Problem: RH Bed Mobility Goal: LTG Patient will perform bed mobility with assist (PT) Description: LTG: Patient will perform bed mobility with assistance, with/without cues (PT). Flowsheets (Taken 09/15/2020 2251) LTG: Pt will perform bed mobility with assistance level of: Supervision/Verbal cueing   Problem: RH Bed to Chair Transfers Goal: LTG Patient will perform bed/chair transfers w/assist (PT) Description: LTG: Patient will perform bed to chair transfers with assistance (PT). Flowsheets (Taken 09/15/2020 2251) LTG: Pt will perform Bed to Chair Transfers with assistance level: Minimal Assistance - Patient > 75%   Problem: RH Car Transfers Goal: LTG Patient will perform car transfers with assist (PT) Description: LTG: Patient will perform car transfers with assistance (PT). Flowsheets (Taken 09/15/2020 2251) LTG: Pt will perform car transfers with assist:: Contact Guard/Touching assist   Problem: RH Furniture  Transfers Goal: LTG Patient will perform furniture transfers w/assist (OT/PT) Description: LTG: Patient will perform furniture transfers  with assistance (OT/PT). Flowsheets (Taken 09/15/2020 2251) LTG: Pt will perform furniture transfers with assist:: Minimal Assistance - Patient > 75%   Problem: RH Ambulation Goal: LTG Patient will ambulate in controlled environment (PT) Description: LTG: Patient will ambulate in a controlled environment, # of feet with assistance (PT). Flowsheets (Taken 09/15/2020 2251) LTG: Pt will ambulate in controlled environ  assist needed:: Minimal Assistance - Patient > 75% LTG: Ambulation distance in controlled environment: 75 feet with LRAD Goal: LTG Patient will ambulate in home environment (PT) Description: LTG: Patient will ambulate in home environment, # of feet with assistance (PT). Flowsheets (Taken 09/15/2020 2251) LTG: Pt will ambulate in home environ  assist needed:: Minimal Assistance - Patient > 75% LTG: Ambulation distance in home environment: 50 ft with LRAD   Problem: RH Wheelchair Mobility Goal: LTG Patient will propel w/c in controlled environment (PT) Description: LTG: Patient will propel wheelchair in controlled environment, # of feet with assist (PT) Flowsheets (Taken 09/15/2020 2251) LTG: Pt will propel w/c in controlled environ  assist needed:: Supervision/Verbal cueing LTG: Propel w/c distance in controlled environment: 100 ft Goal: LTG Patient will propel w/c in home environment (PT) Description: LTG: Patient will propel wheelchair in home environment, # of feet with assistance (PT). Flowsheets (Taken 09/15/2020 2251) Distance: wheelchair distance in controlled environment: 100 LTG: Propel w/c distance in home environment: 50 ft   Problem: RH Stairs Goal: LTG Patient will ambulate up and down stairs w/assist (PT) Description: LTG: Patient will ambulate up and down # of stairs with assistance (PT) Flowsheets (Taken 09/15/2020 2251) LTG: Pt will  ambulate up/down stairs assist needed:: Minimal Assistance - Patient > 75% LTG: Pt will  ambulate up and down number of stairs: at least 4 steps using HR  setup as per d/c location

## 2020-09-15 NOTE — IPOC Note (Addendum)
Overall Plan of Care [facility]) Patient Details Name: Darryl Sullivan MRN: [id number] DOB: 20-Dec-1965  Admitting Diagnosis: Stroke of right basal ganglia [facility])  Hospital Problems: Principal Problem:   Stroke of right basal ganglia (HCC)     Functional Problem List: Nursing Safety,Endurance,Medication Management  PT Balance,Endurance,Motor,Nutrition,Pain,Safety  OT Balance,Skin Integrity,Endurance,Motor  SLP    TR         Basic ADL's: OT Eating,Dressing,Toileting,Bathing,Grooming     Advanced  ADL's: OT       Transfers: PT Bed Mobility,Bed to Chair,Car,Furniture  OT Toilet,Tub/Shower     Locomotion: PT Ambulation,Stairs     Additional Impairments: OT Fuctional Use of Upper Extremity  SLP        TR      Anticipated Outcomes Item Anticipated Outcome  Self Feeding ind  Swallowing      Basic self-care  min A  Toileting  CGA   Bathroom Transfers CGA  Bowel/Bladder  n/a  Transfers  supervision  Locomotion  CGA/ Min A  Communication     Cognition     Pain  n/a  Safety/Judgment  Maintain safety with cues/reminders   Therapy Plan: PT Intensity: Minimum of 1-2 x/day ,45 to 90 minutes PT Frequency: 5 out of 7 days PT Duration Estimated Length of Stay: 2.5-3 weeks OT Intensity: Minimum of 1-2 x/day, 45 to 90 minutes OT Frequency: 5 out of 7 days OT Duration/Estimated Length of Stay: 2.5 to 3 weeks     Due to the current state of emergency, patients may not be receiving their 3-hours of Medicare-mandated therapy.   Team Interventions: Nursing Interventions Patient/Family Education,Discharge Planning,Medication Management,Disease Management/Prevention  PT interventions Ambulation/gait training,Balance/vestibular training,Cognitive remediation/compensation,Community reintegration,Discharge planning,Disease management/prevention,DME/adaptive equipment instruction,Functional mobility training,Neuromuscular re-education,Pain management,Patient/family  education,Psychosocial support,Splinting/orthotics,Stair training,Therapeutic Activities,Therapeutic Exercise,UE/LE Strength taining/ROM,UE/LE Psychiatrist propulsion/positioning  OT Interventions Balance/vestibular training,Disease mangement/prevention,Neuromuscular re-education,Self Care/advanced ADL retraining,Therapeutic Exercise,Wheelchair propulsion/positioning,UE/LE Strength taining/ROM,Skin care/wound IT sales professional instruction,Cognitive remediation/compensation,Community reintegration,Functional electrical stimulation,Splinting/orthotics,UE/LE Coordination activities,Patient/family education,Visual/perceptual remediation/compensation,Therapeutic Activities,Psychosocial support,Functional mobility training,Discharge planning  SLP Interventions    TR Interventions    SW/CM Interventions Discharge Planning,Psychosocial Support,Patient/Family Education,Disease Management/Prevention   Barriers to Discharge MD  Medical stability  Nursing Decreased caregiver support,Home environment access/layout initial d/c to nephew's home changed to ex-wife's home  PT Inaccessible home environment,Decreased caregiver support,Home environment access/layout,Incontinence,Lack of/limited family support,Insurance for SNF coverage,Weight unknown d/c location  OT      SLP      SW Decreased caregiver support,Lack of/limited family support     Team Discharge Planning: Destination: PT-Home ,OT- Home , SLP-  Projected Follow-up: PT-Home health PT, OT-  Home health OT, SLP-  Projected Equipment Needs: PT-To be determined, OT- 3 in 1 bedside comode,Tub/shower bench, SLP-  Equipment Details: PT-Pt has no prior DME; new DME TBD, OT-  Patient/family involved in discharge planning: PT- Patient,  OT-Patient, SLP-   MD ELOS: 12-16d Medical Rehab Prognosis:  Good Assessment:  55 year old male in relatively good health who was admitted on 08/28/2020 to [facility] with left  hemiparesis and elevated blood pressure.  UDS negative.  History taken from chart review and patient due to slowed cognition. CT head unremarkable for acute intracranial process and patient received TPA.  CTA head/neck was negative for LVO or stenosis. MRI brain on 09/05/2020 discussed and reviewed with Neurology, showing white matter and basal ganglia infarcts on right. Echocardiogram with ejection fraction of 60-65% with moderate LVH.  Follow up CT personally reviewed, showing small left basal ganglia hemorrhage, likely hemorrhagic conversion. Neurology recommended low dose  ASA for stroke due to small vessel disease and outpatient sleep study to rule out OSA.  Hospital course further complicated by AKI and renal ultrasound performed, showing increased cortical echogenicity c/w medical renal disease.  Meds titrated for BP control and metoprolol added tachycardia with activity. He continues to be limited by LUE/LLE weakness affecting ADLs as well as mobility. CIR recommended due to functional decline.   Now requiring 24/7 Rehab RN,MD, as well as CIR level PT, OT and SLP.  Treatment team will focus on ADLs and mobility with goals set at [facility]  See Team Conference Notes for weekly updates to the plan of care

## 2020-09-15 NOTE — Progress Notes (Signed)
Inpatient Rehabilitation Care Coordinator Assessment and Plan Patient Details  Name: Darryl Sullivan MRN: [id number] Date of Birth: Apr 10, 1966  Today's Date: 09/15/2020  Hospital Problems: Principal Problem:   Stroke of right basal ganglia [facility])  Past Medical History:  Past Medical History:  Diagnosis Date  . Hypertension    Past Surgical History: History reviewed. No pertinent surgical history. Social History:  reports that he has been smoking cigarettes. He has been smoking about 0.50 packs per day. He uses smokeless tobacco. He reports that he does not drink alcohol and does not use drugs.  Family / Support Systems Marital Status: Divorced How Long?: 3 years Spouse/Significant Other: Building control surveyor (ex wife) Children: Orthoptist) Other Supports: Scientific laboratory technician Anticipated Caregiver: Field seismologist (avaliable in evenings), Daughter (Works during Day) Ability/Limitations of Caregiver: Spouse avalibale 24/7 pt prefers not to d/c. Pt will determine with dtr Caregiver Availability: Evenings only Family Dynamics: pt divorced from spouse lives in [place]. Has familly support from uncle, nephew and dauhgter  Social History Preferred language: English Religion: Patient Refused Education: Graduated Read: Yes Write: Yes Employment Status: Employed Name of Employer: Agricultural consultant Return to Work Plans: yes Public relations account executive Issues: n/a Guardian/Conservator: n/a   Abuse/Neglect Abuse/Neglect Assessment Can Be Completed: Yes Physical Abuse: Denies Verbal Abuse: Denies Sexual Abuse: Denies Exploitation of patient/patient's resources: Denies Self-Neglect: Denies  Emotional Status Pt's affect, behavior and adjustment status: coping Recent Psychosocial Issues: n/a Psychiatric History: n/a Substance Abuse History: Cigarettes (1 pack a day)  Patient / Family Perceptions, Expectations & Goals Pt/Family understanding of illness & functional limitations: yes Premorbid  pt/family roles/activities: Pt previously working and independent Anticipated changes in roles/activities/participation: Pt will require some assistance/supervion at d/c Pt/family expectations/goals: [place]: None Premorbid Home Care/DME Agencies: None Transportation available at discharge: family able to transport Resource referrals recommended: Neuropsychology (coping)  Discharge Planning Living Arrangements: Other relatives Barrister's clerk and nephews spouse) Support Systems: IT trainer other,Other relatives (Uncle, Field seismologist, Daughter, Ex Wife) Type of Residence: Private residence (1 level home, 4 steps to enter) Administrator, sports: Multimedia programmer (specify) Nurse, mental health) Financial Resources: Employment Museum/gallery curator Screen Referred: No Living Expenses: Lives with family Money Management: Patient Does the patient have any problems obtaining your medications?: No Home Management: Administrator, Civil Service Preliminary Plans: Some assistance Care Coordinator Barriers to Discharge: Decreased caregiver support,Lack of/limited family support Care Coordinator Anticipated Follow Up Needs: HH/OP Expected length of stay: 8-11 Days  Clinical Impression SW met with pt introduced self, explained role and process. (called pt daughter at bedside). Pt participating well with therapy and voiced he would like to stay long as possible to regain strength. Pt reports he plans to d/c back home with his nephew and his nephews wife, dtr reports there will not be much assistance. Pt ex-wife does not work and will able to provide 24/7 care to pt at d/c. SW will allow pt to make decision once we determine a d/c date. 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 Pt reports he would like his dtr to remain primary contact, dtr reports she could check on pt after work but he will have more  assistance with ex-wife. SW will cont to follow up with questions or concerns.  Darryl Sullivan 09/15/2020, 12:51 PM

## 2020-09-15 NOTE — Plan of Care (Signed)
Problem: RH Balance Goal: LTG: Patient will maintain dynamic sitting balance (OT) Description: LTG:  Patient will maintain dynamic sitting balance with assistance during activities of daily living (OT) Flowsheets (Taken 09/15/2020 1234) LTG: Pt will maintain dynamic sitting balance during ADLs with: Supervision/Verbal cueing Goal: LTG Patient will maintain dynamic standing with ADLs (OT) Description: LTG:  Patient will maintain dynamic standing balance with assist during activities of daily living (OT)  Flowsheets (Taken 09/15/2020 1234) LTG: Pt will maintain dynamic standing balance during ADLs with: Contact Guard/Touching assist   Problem: Sit to Stand Goal: LTG:  Patient will perform sit to stand in prep for activites of daily living with assistance level (OT) Description: LTG:  Patient will perform sit to stand in prep for activites of daily living with assistance level (OT) Flowsheets (Taken 09/15/2020 1234) LTG: PT will perform sit to stand in prep for activites of daily living with assistance level: Supervision/Verbal cueing   Problem: RH Eating Goal: LTG Patient will perform eating w/assist, cues/equip (OT) Description: LTG: Patient will perform eating with assist, with/without cues using equipment (OT) Flowsheets (Taken 09/15/2020 1234) LTG: Pt will perform eating with assistance level of: Independent   Problem: RH Grooming Goal: LTG Patient will perform grooming w/assist,cues/equip (OT) Description: LTG: Patient will perform grooming with assist, with/without cues using equipment (OT) Flowsheets (Taken 09/15/2020 1234) LTG: Pt will perform grooming with assistance level of: Supervision/Verbal cueing   Problem: RH Bathing Goal: LTG Patient will bathe all body parts with assist levels (OT) Description: LTG: Patient will bathe all body parts with assist levels (OT) Flowsheets (Taken 09/15/2020 1234) LTG: Pt will perform bathing with assistance level/cueing: Minimal Assistance - Patient >  75%   Problem: RH Dressing Goal: LTG Patient will perform upper body dressing (OT) Description: LTG Patient will perform upper body dressing with assist, with/without cues (OT). Flowsheets (Taken 09/15/2020 1234) LTG: Pt will perform upper body dressing with assistance level of: Supervision/Verbal cueing Goal: LTG Patient will perform lower body dressing w/assist (OT) Description: LTG: Patient will perform lower body dressing with assist, with/without cues in positioning using equipment (OT) Flowsheets (Taken 09/15/2020 1234) LTG: Pt will perform lower body dressing with assistance level of: Contact Guard/Touching assist   Problem: RH Toileting Goal: LTG Patient will perform toileting task (3/3 steps) with assistance level (OT) Description: LTG: Patient will perform toileting task (3/3 steps) with assistance level (OT)  Flowsheets (Taken 09/15/2020 1234) LTG: Pt will perform toileting task (3/3 steps) with assistance level: Contact Guard/Touching assist   Problem: RH Functional Use of Upper Extremity Goal: LTG Patient will use RT/LT upper extremity as a (OT) Description: LTG: Patient will use right/left upper extremity as a stabilizer/gross assist/diminished/nondominant/dominant level with assist, with/without cues during functional activity (OT) Flowsheets (Taken 09/15/2020 1234) LTG: Use of upper extremity in functional activities: LUE as a stabilizer LTG: Pt will use upper extremity in functional activity with assistance level of: Minimal Assistance - Patient > 75%   Problem: RH Toilet Transfers Goal: LTG Patient will perform toilet transfers w/assist (OT) Description: LTG: Patient will perform toilet transfers with assist, with/without cues using equipment (OT) Flowsheets (Taken 09/15/2020 1234) LTG: Pt will perform toilet transfers with assistance level of: Contact Guard/Touching assist   Problem: RH Tub/Shower Transfers Goal: LTG Patient will perform tub/shower transfers w/assist  (OT) Description: LTG: Patient will perform tub/shower transfers with assist, with/without cues using equipment (OT) Flowsheets (Taken 09/15/2020 1234) LTG: Pt will perform tub/shower stall transfers with assistance level of: Contact Guard/Touching assist

## 2020-09-15 NOTE — Evaluation (Signed)
Physical Therapy Assessment and Plan  Patient Details  Name: Darryl Sullivan MRN: [id number] Date of Birth: [birth year]  PT Diagnosis: Coordination disorder, Difficulty walking, Hemiparesis non-dominant, Hypotonia, Low back pain and Muscle weakness Rehab Potential: Fair ELOS: 2.5-3 weeks   Today's Date: 09/15/2020 PT Individual Time: 1110-1223 PT Individual Time Calculation (min): 73 min    Hospital Problem: Principal Problem:   Stroke of right basal ganglia (HCC)   Past Medical History:  Past Medical History:  Diagnosis Date   Hypertension    Past Surgical History: History reviewed. No pertinent surgical history.  Assessment & Plan Clinical Impression: Patient is a 55 y.o. male in relatively good health who was admitted on 08/28/2020 to [facility] with left hemiparesis and elevated blood pressure.  UDS negative.  History taken from chart review and patient due to slowed cognition. CT head unremarkable for acute intracranial process and patient received TPA.  CTA head/neck was negative for LVO or stenosis. MRI brain on 09/05/2020 discussed and reviewed with Neurology, showing white matter and basal ganglia infarcts on right. Echocardiogram with ejection fraction of 60-65% with moderate LVH.  Follow up CT personally reviewed, showing small left basal ganglia hemorrhage, likely hemorrhagic conversion. Neurology recommended low dose ASA for stroke due to small vessel disease and outpatient sleep study to rule out OSA.  Hospital course further complicated by AKI and renal ultrasound performed, showing increased cortical echogenicity c/w medical renal disease.  Meds titrated for BP control and metoprolol added tachycardia with activity. He continues to be limited by LUE/LLE weakness affecting ADLs as well as mobility. CIR recommended due to functional decline.  Patient transferred to CIR on 09/14/2020 .   Patient currently requires Mod/ max with mobility secondary to muscle weakness, decreased  cardiorespiratoy endurance, impaired timing and sequencing, abnormal tone, unbalanced muscle activation, decreased coordination and decreased motor planning, decreased midline orientation, decreased attention to left and decreased motor planning, decreased safety awareness and decreased sitting balance, decreased standing balance and decreased balance strategies.  Prior to hospitalization, patient was independent  with mobility and lived with Family in a House (unsure of d/c location at time of eval) home.  Home access is unsure of d/c location at time of evalStairs to enter (unsure of d/c location at time of eval).  Patient will benefit from skilled PT intervention to maximize safe functional mobility, minimize fall risk and decrease caregiver burden for planned discharge home with 24 hour assist.  Anticipate patient will benefit from follow up [facility] at discharge.  PT - End of Session Activity Tolerance: Tolerates 30+ min activity with multiple rests Endurance Deficit: Yes Endurance Deficit Description: req increased time and seated rest breaks to complete ADL PT Assessment Rehab Potential (ACUTE/IP ONLY): Fair PT Barriers to Discharge: [place] home environment;Decreased caregiver support;Home environment access/layout;Incontinence;Lack of/limited family support;Insurance for SNF coverage;Weight PT Barriers to Discharge Comments: unknown d/c location PT Patient demonstrates impairments in the following area(s): Balance;Endurance;Motor;Nutrition;Pain;Safety PT Transfers Functional Problem(s): Bed Mobility;Bed to Chair;Car;Furniture PT Locomotion Functional Problem(s): Ambulation;Stairs PT Plan PT Intensity: Minimum of 1-2 x/day ,45 to 90 minutes PT Frequency: 5 out of 7 days PT Duration Estimated Length of Stay: 2.5-3 weeks PT Treatment/Interventions: Ambulation/gait training;Balance/vestibular training;Cognitive remediation/compensation;Community reintegration;Discharge planning;Disease  management/prevention;DME/adaptive equipment instruction;Functional mobility training;Neuromuscular re-education;Pain management;Patient/family education;Psychosocial support;Splinting/orthotics;Stair training;Therapeutic Activities;Therapeutic Exercise;UE/LE Strength taining/ROM;UE/LE Coordination activities;Wheelchair propulsion/positioning PT Transfers Anticipated Outcome(s): supervision PT Locomotion Anticipated Outcome(s): CGA/ Min A PT Recommendation Recommendations for Other Services: Therapeutic Recreation consult Therapeutic Recreation Interventions: Stress management;Outing/community reintergration Follow Up Recommendations: Home health PT  Patient destination: Home Equipment Recommended: To be determined Equipment Details: Pt has no prior DME; new DME TBD   PT Evaluation Precautions/Restrictions Precautions Precautions: Fall Precaution Comments: L hemi UE> LE, L AFO, mild dysarthria Restrictions Weight Bearing Restrictions: No General   Vital SignsTherapy Vitals Temp: 99.7 F (37.6 C) Temp Source: Oral Pulse Rate: 72 Resp: 18 BP: (!) 142/83 Patient Position (if appropriate): Sitting Oxygen Therapy SpO2: 99 % O2 Device: Room Air Pain Pain Assessment Pain Scale: 0-10 Pain Score: 0-No pain Home Living/Prior Functioning Home Living Living Arrangements: Other relatives (Nephew and nephews spouse) Available Help at Discharge: Family;Available 24 hours/day (family will be available for 24hr supervision but unsure of exact d/c location at time of eval) Type of Home: House (unsure of d/c location at time of eval) Home Access: Stairs to enter (unsure of d/c location at time of eval) Entrance Stairs-Number of Steps: unsure of d/c location at time of eval Home Layout: Other (Comment) (unsure of d/c location at time of eval) Bathroom Shower/Tub:  (unsure of d/c location at time of eval) Bathroom Toilet:  (unsure of d/c location at time of eval)  Lives With: Family Prior  Function Level of Independence: Independent with gait;Independent with basic ADLs;Independent with homemaking with ambulation;Independent with transfers  Able to Take Stairs?: Yes Driving: Yes Vocation: Full time employment Comments: Pt independent, working in [employer] technologist, no falls Vision/Perception  Vision - Assessment Eye Alignment: Within Functional Limits Ocular Range of Motion: Within Functional Limits Alignment/Gaze Preference: Within Defined Limits Tracking/Visual Pursuits: Able to track stimulus in all quads without difficulty Saccades: Decreased speed of saccadic movement Convergence: Within functional limits Perception Perception: Impaired Inattention/Neglect: Impaired-to be further tested in functional context (potential inattention to L) Praxis Praxis: Intact  Cognition Overall Cognitive Status: Within Functional Limits for tasks assessed Arousal/Alertness: Awake/alert Orientation Level: Oriented X4 Memory: Appears intact Immediate Memory Recall: Sock;Blue;Bed Memory Recall Sock: Without Cue Memory Recall Blue: Without Cue Memory Recall Bed: Without Cue Awareness: Appears intact Problem Solving: Appears intact Safety/Judgment: Appears intact Sensation Sensation Light Touch: Appears Intact Hot/Cold: Appears Intact Proprioception: Impaired by gross assessment Stereognosis: Impaired by gross assessment Additional Comments: impaired LUE, RUE WFL Coordination Gross Motor Movements are Fluid and Coordinated: No Fine Motor Movements are Fluid and Coordinated: No Coordination and Movement Description: L hemi LUE>LLE Finger Nose Finger Test: unable to perform on L side Heel Shin Test: Impaired with inability to perform on L and slow with uncoordinated movements on R requiring time to perform Motor  Motor Motor: Hemiplegia Motor - Skilled Clinical Observations: L hemi LUE>LLE   Trunk/Postural Assessment  Cervical Assessment Cervical  Assessment: Exceptions to [facility] (forward head) Thoracic Assessment Thoracic Assessment: Exceptions to [facility] (rounded shoulders) Lumbar Assessment Lumbar Assessment: Within Functional Limits (posterior pelvic tilt) Postural Control Postural Control: Deficits on evaluation (posterior LOB dynamic standing)  Balance Balance Balance Assessed: Yes Static Sitting Balance Static Sitting - Balance Support: No upper extremity supported;Feet supported Static Sitting - Level of Assistance: 5: Stand by assistance Dynamic Sitting Balance Dynamic Sitting - Balance Support: Feet supported;Right upper extremity supported Dynamic Sitting - Level of Assistance: 5: Stand by assistance Static Standing Balance Static Standing - Balance Support: Right upper extremity supported Static Standing - Level of Assistance: 4: Min assist Dynamic Standing Balance Dynamic Standing - Balance Support: Bilateral upper extremity supported Dynamic Standing - Level of Assistance: 3: Mod assist Dynamic Standing - Balance Activities: Reaching for objects;Forward lean/weight shifting;Lateral lean/weight shifting Extremity Assessment  RUE Assessment RUE Assessment: Within  Functional Limits Active Range of Motion (AROM) Comments: 3/4 to full shoulder flexion General Strength Comments: 5/5 in shoulder flexion LUE Assessment LUE Assessment: Exceptions to [facility] LUE Body System: Neuro Brunstrum levels for arm and hand: Arm;Hand Brunstrum level for arm: Stage II Synergy is developing Brunstrum level for hand: Stage I Flaccidity LUE Strength LUE Overall Strength Comments: Shoulder abduction, elbow flexion 3 -/5, distally 0/5 LUE Tone LUE Tone: Hypertonic;Mild RLE Assessment RLE Assessment: Within Functional Limits LLE Assessment LLE Assessment: Exceptions to [facility] LLE Strength Left Hip Flexion: 3-/5 Left Hip Extension: 2+/5 Left Hip ABduction: 2+/5 Left Hip ADduction: 2+/5 Left Knee Flexion: 3-/5 Left Knee Extension: 3/5 Left  Ankle Dorsiflexion: 0/5 Left Ankle Plantar Flexion: 0/5  Care Tool Care Tool Bed Mobility Roll left and right activity   Roll left and right assist level: Minimal Assistance - Patient > 75%    Sit to lying activity   Sit to lying assist level: Moderate Assistance - Patient 50 - 74%    Lying to sitting edge of bed activity   Lying to sitting edge of bed assist level: Moderate Assistance - Patient 50 - 74%     Care Tool Transfers Sit to stand transfer   Sit to stand assist level: Moderate Assistance - Patient 50 - 74%    Chair/bed transfer   Chair/bed transfer assist level: Moderate Assistance - Patient 50 - 74%     Toilet transfer   Assist Level: Moderate Assistance - Patient 50 - 74%    Car transfer   Car transfer assist level: Moderate Assistance - Patient 50 - 74%      Care Tool Locomotion Ambulation   Assist level: Maximal Assistance - Patient 25 - 49% Assistive device: Walker-rolling Max distance: 12 ft  Walk 10 feet activity   Assist level: Maximal Assistance - Patient 25 - 49% Assistive device: Walker-rolling   Walk 50 feet with 2 turns activity Walk 50 feet with 2 turns activity did not occur: Safety/medical concerns      Walk 150 feet activity Walk 150 feet activity did not occur: Safety/medical concerns      Walk 10 feet on uneven surfaces activity Walk 10 feet on uneven surfaces activity did not occur: Safety/medical concerns      Stairs Stair activity did not occur: Safety/medical concerns        Walk up/down 1 step activity Walk up/down 1 step or curb (drop down) activity did not occur: Safety/medical concerns     Walk up/down 4 steps activity did not occuR: Safety/medical concerns  Walk up/down 4 steps activity      Walk up/down 12 steps activity Walk up/down 12 steps activity did not occur: Safety/medical concerns      Pick up small objects from floor Pick up small object from the floor (from standing position) activity did not occur:  Safety/medical concerns      Wheelchair Will patient use wheelchair at discharge?: Yes Type of Wheelchair: Manual Wheelchair activity did not occur: Safety/medical concerns      Wheel 50 feet with 2 turns activity Wheelchair 50 feet with 2 turns activity did not occur: Safety/medical concerns    Wheel 150 feet activity Wheelchair 150 feet activity did not occur: Safety/medical concerns      Refer to Care Plan for Long Term Goals  SHORT TERM GOAL WEEK 1 PT Short Term Goal 1 (Week 1): Pt will perform bed mobility with Min A. PT Short Term Goal 2 (Week 1): Pt will perform SPVT  with RW and Mod A with no vc. PT Short Term Goal 3 (Week 1): Pt will ambulate 30 feet with RW and Mod A. PT Short Term Goal 4 (Week 1): Pt will initiate w/c mobility training.  Recommendations for other services: Therapeutic Recreation  Stress management and Outing/community reintegration  Skilled Therapeutic Intervention PT Evaluation completed; see above/ below for results. PT educated patient in roles of PT vs OT, PT POC, rehab potential, rehab goals, and discharge recommendations along with recommendation for follow-up rehabilitation services. Individual treatment initiated:  Patient seated upright in w/c upon PT arrival. Patient alert and agreeable to PT session. No pain complaint during session. Pt requires Max A for donning socks/ shoes with AFO to LLE.   Therapeutic Activity: Bed Mobility: Patient performed supine <> sit with Mod A for UB. Provided verbal cues for technique. Transfers: Patient performed STS w/c <> RW with no cues and completes with Mod A. When vc provided for improved technique and focus for foot positioning, equal WB, push with RUE from w/c to upright stance with no AD, then pt is able to improve to Min A. With fatigue at end of session, pt requires increased cueing and Min/ mod A. SPVT requires Mod/ Max A for appropriate weight shift and step progression of BLE especially progression of  LLE. Demos some strength in functional hip flexion with decrease in motor control.   Gait Training:  Patient ambulated almost 15 feet using RW with Max A. Requires max A for appropriate sequencing and performance of weight shifting, L hip flexion, leading LLE step progression with knee flexion forward, L knee extension in WB and vc required for all throughout. Pt was ambulating further in acute care at [facility], but ends bout today stating increased soreness in low back. No rating for pain, but noted increase in soreness.   Neuromuscular Re-ed: NMR facilitated during session with focus on standing balance and LLE muscle activation in flexion. Pt guided in glute and quad set during upright posture in static stance. Progressed to hip flexor sustained activation with paired knee ext, then flexion for toe touches to 2" step. Focus on clearing step with no scraping of foot onto/ off of step. Requires Min/ Mod A initially and improves to Min A/ CGA to complete. NMR performed for improvements in motor control and coordination, balance, sequencing, judgement, and self confidence/ efficacy in performing all aspects of mobility at highest level of independence.   Patient seated upright in w/c  at end of session with brakes locked as lunch arriving. Belt alarm set, and all needs within reach. Pt setup with opening of items on tray requiring 2 hands to manipulate. SW present with dtr on phone in order to discuss orienting to social work and initiating discussion of weekly updates in team meetings and discharge planning.    Mobility Bed Mobility Bed Mobility: Supine to Sit Supine to Sit: Moderate Assistance - Patient 50-74% Transfers Transfers: Sit to Stand;Stand to Sit;Stand Pivot Transfers Sit to Stand: Minimal Assistance - Patient > 75% Stand to Sit: Minimal Assistance - Patient > 75% Stand Pivot Transfers: Moderate Assistance - Patient 50 - 74% Stand Pivot Transfer Details: Tactile cues for sequencing;Tactile  cues for weight shifting;Tactile cues for posture;Tactile cues for placement;Verbal cues for sequencing;Verbal cues for technique;Verbal cues for gait pattern;Verbal cues for safe use of DME/AE Transfer (Assistive device): Rolling walker Locomotion  Gait Ambulation: Yes Gait Distance (Feet): 12 Feet Assistive device: Rolling walker Gait Gait: Yes Gait Pattern: Impaired Gait Pattern:  Step-to pattern;Decreased step length - right;Decreased step length - left;Decreased stance time - left;Decreased weight shift to left;Poor foot clearance - left Wheelchair Mobility Wheelchair Mobility: No   Discharge Criteria: Patient will be discharged from PT if patient refuses treatment 3 consecutive times without medical reason, if treatment goals not met, if there is a change in medical status, if patient makes no progress towards goals or if patient is discharged from hospital.  The above assessment, treatment plan, treatment alternatives and goals were discussed and mutually agreed upon: by patient  Alger Simons PT, DPT 09/15/2020, 3:25 PM

## 2020-09-15 NOTE — Progress Notes (Signed)
Inpatient Rehabilitation Center Individual Statement of Services  Patient Name:  Darryl Sullivan  Date:  09/15/2020  Welcome to the Inpatient Rehabilitation Center.  Our goal is to provide you with an individualized program based on your diagnosis and situation, designed to meet your specific needs.  With this comprehensive rehabilitation program, you will be expected to participate in at least 3 hours of rehabilitation therapies Monday-Friday, with modified therapy programming on the weekends.  Your rehabilitation program will include the following services:  Physical Therapy (PT), Occupational Therapy (OT), Speech Therapy (ST), 24 hour per day rehabilitation nursing, Therapeutic Recreaction (TR), Neuropsychology, Care Coordinator, Rehabilitation Medicine, Nutrition Services, Pharmacy Services and Other  Weekly team conferences will be held on Wednesdays to discuss your progress.  Your Inpatient Rehabilitation Care Coordinator will talk with you frequently to get your input and to update you on team discussions.  Team conferences with you and your family in attendance may also be held.  Expected length of stay: 8-10 Days  Overall anticipated outcome: MOD I  Depending on your progress and recovery, your program may change. Your Inpatient Rehabilitation Care Coordinator will coordinate services and will keep you informed of any changes. Your Inpatient Rehabilitation Care Coordinator's name and contact numbers are listed  below.  The following services may also be recommended but are not provided by the Inpatient Rehabilitation Center:    Home Health Rehabiltiation Services  Outpatient Rehabilitation Services    Arrangements will be made to provide these services after discharge if needed.  Arrangements include referral to agencies that provide these services.  Your insurance has been verified to be:  BCBS Your primary doctor is:  NO PCP  Pertinent information will be shared with your doctor  and your insurance company.  Inpatient Rehabilitation Care Coordinator:  Lavera Guise, [place] [phone] or [phone]  Information discussed with and copy given to patient by: Andria Rhein, 09/15/2020, 12:12 PM

## 2020-09-15 NOTE — Progress Notes (Signed)
Physical Therapy Session Note  Patient Details  Name: Darryl Sullivan MRN: [id number] Date of Birth: 10-26-1965  Today's Date: 09/15/2020 PT Individual Time: 1621-1720 PT Individual Time Calculation (min): 59 min   Short Term Goals: Week 1:  PT Short Term Goal 1 (Week 1): Pt will perform bed mobility with Min A. PT Short Term Goal 2 (Week 1): Pt will perform SPVT with RW and Mod A with no vc. PT Short Term Goal 3 (Week 1): Pt will ambulate 30 feet with RW and Mod A.  Skilled Therapeutic Interventions/Progress Updates:  Pt received supine in bed and agreeable to PT. Supine>sit transfer with min assist and cues for attention to the LUE.   sitting balance EOB x 2 min with cues for decreased  Use of UE support. Stedy transfer to [facility] with supervision assist. Sit<>stand with minA, RW, and LAFO. Gait training with RW x 59f and mod assist to improve step length/width on the LLE. Seated NMR LAQ and reciprocal hip fleixon. Stand pivot [facility] and moderate cues for improved use and attention to the LLE. Squat pivot transfer to nustep with min assist and moderate cues for sequencing. Reciprocal movement training x 3 min +2 min with cues improved attention to the LLE and improved hip er/IR positioning. therapeutice rest break between bouts.  Pt returned to room and performed stand pivot transfer to bed with min assist and  Cues for improved use of the LLE. Sit>supine completed with min assist for LLE control, and left supine in bed with call bell in reach and all needs met.      Therapy Documentation Precautions:  Precautions Precautions: Fall Precaution Comments: L hemi UE> LE, L AFO, mild dysarthria Restrictions Weight Bearing Restrictions: No Vital Signs: Therapy Vitals Temp: 99.7 F (37.6 C) Temp Source: Oral Pulse Rate: 72 Resp: 18 BP: (!) 142/83 Patient Position (if appropriate): Sitting Oxygen Therapy SpO2: 99 % O2 Device: Room Air Pain: Pain  Assessment Pain Scale: 0-10 Pain Score: 0-No pain   Therapy/Group: Individual Therapy  ALorie Phenix4/12/2020, 5:20 PM

## 2020-09-15 NOTE — Evaluation (Signed)
Occupational Therapy Assessment and Plan  Patient Details  Name: Darryl Sullivan MRN: [id number] Date of Birth: Oct 27, 1965  OT Diagnosis: abnormal posture, cognitive deficits, hemiplegia affecting non-dominant side, muscle weakness (generalized) and decreased activity tolerance, L hemiplegia , decreased dynamic standing balance/postural control impairing ADL/func mobility  Rehab Potential: Rehab Potential (ACUTE ONLY): Good ELOS: 2.5 to 3 weeks   Today's Date: 09/15/2020 OT Individual Time: 0907-1000 OT Individual Time Calculation (min): 53 min     Hospital Problem: Principal Problem:   Stroke of right basal ganglia ([place])   Past Medical History:  Past Medical History:  Diagnosis Date  . Hypertension    Past Surgical History: History reviewed. No pertinent surgical history.  Assessment & Plan Clinical Impression: Patient is a 55 y.o. year old male with relatively good health who was admitted on 08/28/2020 to [facility] with left hemiparesis and elevated blood pressure.  UDS negative.  History taken from chart review and patient due to slowed cognition. CT head unremarkable for acute intracranial process and patient received TPA.  CTA head/neck was negative for LVO or stenosis. MRI brain on 09/05/2020 discussed and reviewed with Neurology, showing white matter and basal ganglia infarcts on right. Echocardiogram with ejection fraction of 60-65% with moderate LVH.  Follow up CT personally reviewed, showing small left basal ganglia hemorrhage, likely hemorrhagic conversion. Neurology recommended low dose ASA for stroke due to small vessel disease and outpatient sleep study to rule out OSA.  Hospital course further complicated by AKI and renal ultrasound performed, showing increased cortical echogenicity c/w medical renal disease.  Meds titrated for BP control and metoprolol added tachycardia with activity. He continues to be limited by LUE/LLE weakness affecting ADLs as well as mobility. CIR recommended  due to functional decline.  Please see preadmission assessment from earlier today as well. Patient transferred to CIR on 09/14/2020 .    Patient currently requires CGA to max with basic self-care skills secondary to muscle weakness, decreased cardiorespiratoy endurance, impaired timing and sequencing, abnormal tone, unbalanced muscle activation, motor apraxia, ataxia, decreased coordination and decreased motor planning, decreased initiation, decreased attention and decreased problem solving and decreased sitting balance, decreased standing balance, decreased postural control, hemiplegia and decreased balance strategies.  Prior to hospitalization, patient could complete ADL/IADL with independent .  Patient will benefit from skilled intervention to decrease level of assist with basic self-care skills and increase independence with basic self-care skills prior to discharge home with care partner.  Anticipate patient will require intermittent supervision and minimal physical assistance and follow up home health.  OT - End of Session Activity Tolerance: Tolerates 10 - 20 min activity with multiple rests Endurance Deficit: Yes Endurance Deficit Description: req increased time and seated rest breaks to complete ADL OT Assessment Rehab Potential (ACUTE ONLY): Good OT Patient demonstrates impairments in the following area(s): Balance;Skin Integrity;Endurance;Motor OT Basic ADL's Functional Problem(s): Eating;Dressing;Toileting;Bathing;Grooming OT Transfers Functional Problem(s): Toilet;Tub/Shower OT Additional Impairment(s): Fuctional Use of Upper Extremity OT Plan OT Intensity: Minimum of 1-2 x/day, 45 to 90 minutes OT Frequency: 5 out of 7 days OT Duration/Estimated Length of Stay: 2.5 to 3 weeks OT Treatment/Interventions: Balance/vestibular training;Disease mangement/prevention;Neuromuscular re-education;Self Care/advanced ADL retraining;Therapeutic Exercise;Wheelchair propulsion/positioning;UE/LE  Strength taining/ROM;Skin care/wound managment;Pain management;DME/adaptive equipment instruction;Cognitive remediation/compensation;Community reintegration;Functional electrical stimulation;Splinting/orthotics;UE/LE Coordination activities;Patient/family education;Visual/perceptual remediation/compensation;Therapeutic Activities;Psychosocial support;Functional mobility training;Discharge planning OT Self Feeding Anticipated Outcome(s): ind OT Basic Self-Care Anticipated Outcome(s): min A OT Toileting Anticipated Outcome(s): CGA OT Bathroom Transfers Anticipated Outcome(s): CGA OT Recommendation Recommendations for Other Services: Speech consult Patient destination: Home  Follow Up Recommendations: Home health OT Equipment Recommended: 3 in 1 bedside comode;Tub/shower bench   OT Evaluation Precautions/Restrictions  Precautions Precautions: Fall Precaution Comments: L hemi LUE> LLE, L AFO, L splint, mild dysarthria Restrictions Weight Bearing Restrictions: No General Chart Reviewed: Yes Family/Caregiver Present: No Pain Pain Assessment Pain Scale: 0-10 Pain Score: 0-No pain Home Living/Prior Functioning Home Living Family/patient expects to be discharged to:: Private residence Living Arrangements: Alone Vision Baseline Vision/History: No visual deficits Patient Visual Report: No change from baseline Vision Assessment?: Yes Eye Alignment: Within Functional Limits Ocular Range of Motion: Within Functional Limits Alignment/Gaze Preference: Within Defined Limits Tracking/Visual Pursuits: Able to track stimulus in all quads without difficulty Saccades: Decreased speed of saccadic movement Convergence: Within functional limits Visual Fields:  (decreased L visual field > R , to be further tested) Perception  Perception: Impaired Inattention/Neglect: Impaired-to be further tested in functional context (min L inattention, sat on LUE during bed mobility) Praxis Praxis:  Intact Cognition Overall Cognitive Status: Within Functional Limits for tasks assessed Arousal/Alertness: Awake/alert Orientation Level: Person;Place;Situation Person: Oriented Place: Oriented Situation: Oriented Year: 2022 Month: April Day of Week: Incorrect (recalled with 1 VC) Memory: Appears intact Immediate Memory Recall: Sock;Blue;Bed Memory Recall Sock: Without Cue Memory Recall Blue: Without Cue Memory Recall Bed: Without Cue Awareness: Appears intact Problem Solving: Appears intact Safety/Judgment: Appears intact Sensation Sensation Light Touch: Appears Intact Hot/Cold: Appears Intact Proprioception: Impaired by gross assessment Stereognosis: Impaired by gross assessment Additional Comments: impaired LUE, RUE WFL Coordination Gross Motor Movements are Fluid and Coordinated: No Fine Motor Movements are Fluid and Coordinated: No Coordination and Movement Description: L hemi LUE>LLE Finger Nose Finger Test: unable to perform on L side Heel Shin Test: Impaired with inability to perform on L and slow with uncoordinated movements on R requiring time to perform Motor  Motor Motor: Hemiplegia Motor - Skilled Clinical Observations: L hemi LUE>LLE  Trunk/Postural Assessment  Cervical Assessment Cervical Assessment: Exceptions to [facility] (forward head) Thoracic Assessment Thoracic Assessment: Exceptions to [employer] (rounded shoulders) Lumbar Assessment Lumbar Assessment: Within Functional Limits (posterior pelvic tilt) Postural Control Postural Control: Deficits on evaluation (posterior LOB dynamic standing)  Balance Balance Balance Assessed: Yes Static Sitting Balance Static Sitting - Balance Support: No upper extremity supported;Feet supported Static Sitting - Level of Assistance: 5: Stand by assistance Dynamic Sitting Balance Dynamic Sitting - Balance Support: Feet supported;Right upper extremity supported Dynamic Sitting - Level of Assistance: 5: Stand by  assistance Static Standing Balance Static Standing - Balance Support: Right upper extremity supported Static Standing - Level of Assistance: 4: Min assist Dynamic Standing Balance Dynamic Standing - Balance Support: Bilateral upper extremity supported Dynamic Standing - Level of Assistance: 3: Mod assist Dynamic Standing - Balance Activities: Reaching for objects;Forward lean/weight shifting;Lateral lean/weight shifting Extremity/Trunk Assessment RUE Assessment RUE Assessment: Within Functional Limits Active Range of Motion (AROM) Comments: 3/4 to full shoulder flexion General Strength Comments: 5/5 in shoulder flexion LUE Assessment LUE Assessment: Exceptions to [facility] LUE Body System: Neuro Brunstrum levels for arm and hand: Arm;Hand Brunstrum level for arm: Stage II Synergy is developing Brunstrum level for hand: Stage I Flaccidity LUE Strength LUE Overall Strength Comments: Shoulder abduction, elbow flexion 3 -/5, distally 0/5 LUE Tone LUE Tone: Hypertonic;Mild  Care Tool Care Tool Self Care Eating   Eating Assist Level: Set up assist    Oral Care    Oral Care Assist Level: Supervision/Verbal cueing    Bathing   Body parts bathed by patient: Left upper leg;Right upper leg;Front  perineal area;Abdomen;Chest;Left arm Body parts bathed by helper: Right arm;Buttocks;Right lower leg;Left lower leg   Assist Level: Moderate Assistance - Patient 50 - 74%    Upper Body Dressing(including orthotics)   What is the patient wearing?: Pull over shirt;Hospital gown only   Assist Level: Maximal Assistance - Patient 25 - 49%    Lower Body Dressing (excluding footwear)   What is the patient wearing?: Pants;Underwear/pull up Assist for lower body dressing: Maximal Assistance - Patient 25 - 49%    Putting on/Taking off footwear   What is the patient wearing?: Non-skid slipper socks Assist for footwear: Total Assistance - Patient < 25%       Care Tool Toileting Toileting activity    Assist for toileting: Moderate Assistance - Patient 50 - 74%     Care Tool Bed Mobility Roll left and right activity   Roll left and right assist level: Minimal Assistance - Patient > 75%    Sit to lying activity   Sit to lying assist level: Moderate Assistance - Patient 50 - 74%    Lying to sitting edge of bed activity   Lying to sitting edge of bed assist level: Moderate Assistance - Patient 50 - 74%     Care Tool Transfers Sit to stand transfer   Sit to stand assist level: Minimal Assistance - Patient > 75%    Chair/bed transfer   Chair/bed transfer assist level: Moderate Assistance - Patient 50 - 74% (stand-pivot no AD)     Toilet transfer   Assist Level: Moderate Assistance - Patient 50 - 74% (BSC)     Care Tool Cognition Expression of Ideas and Wants Expression of Ideas and Wants: Some difficulty - exhibits some difficulty with expressing needs and ideas (e.g, some words or finishing thoughts) or speech is not clear   Understanding Verbal and Non-Verbal Content Understanding Verbal and Non-Verbal Content: Usually understands - understands most conversations, but misses some part/intent of message. Requires cues at times to understand   Memory/Recall Ability *first 3 days only Memory/Recall Ability *first 3 days only: That he or she is in a hospital/hospital unit;Current season    Refer to Care Plan for Long Term Goals  SHORT TERM GOAL WEEK 1 OT Short Term Goal 1 (Week 1): Pt will complete toilet transfer min A OT Short Term Goal 2 (Week 1): Min will don shirt with min A OT Short Term Goal 3 (Week 1): Pt will don pants max A OT Short Term Goal 4 (Week 1): Pt will complete standing ADL for >5 min min A  Recommendations for other services: Therapeutic Recreation  Pet therapy, Kitchen group, Stress management and Outing/community reintegration   Skilled Therapeutic Intervention ADL ADL Eating: Set up Where Assessed-Eating: Wheelchair Grooming:  Supervision/safety Where Assessed-Grooming: Sitting at sink;Wheelchair Upper Body Bathing: Minimal assistance Where Assessed-Upper Body Bathing: Shower Lower Body Bathing: Moderate cueing Where Assessed-Lower Body Bathing: Shower Upper Body Dressing: Maximal assistance Where Assessed-Upper Body Dressing: Sitting at sink Lower Body Dressing: Maximal assistance Where Assessed-Lower Body Dressing: Sitting at sink Toileting: Moderate assistance Where Assessed-Toileting: Bedside Commode Toilet Transfer: Moderate assistance Toilet Transfer Method: Stand pivot Science writer: Geophysical data processor: Dependent (stedy) Youth worker: Manufacturing systems engineer  Bed Mobility Bed Mobility: Supine to Sit Supine to Sit: Moderate Assistance - Patient 50-74% Transfers Sit to Stand: Minimal Assistance - Patient > 75% Stand to Sit: Minimal Assistance - Patient > 75%  Session Note: Pt received semi-reclined in bed, agreeable to  to OT eval, denies pain. Reviewed role of CIR OT, evaluation process, ADL/func mobility retraining, goals for therapy, and safety plan. Evaluation completed as documented above with session focus on func mobility, showering, and seated dressing/grooming. Came to sitting EOB with mod A to progress BLE and to lift trunk. Brunstrum level II noted in LUE, I in L hand. STS throughout session with CGA from bed/w/c. Stand-pivot with mod A to progress LLE and to facilitate WS with no AD. Attempted stand-pivot to shower, pt with posterior LOB req mod A to correct, pt able to assist in self correcting. Stedy transfer out of shower to w/c, CGA to power up in standing. Completed dressing and seated oral care with above A levels. Pt states he wants to work on walking as his primary goal.  Pt left in w/c with LUE elevated, chair alarm engaged, call bell in reach, and all immediate needs met.   Discharge Criteria: Patient will be discharged from OT if  patient refuses treatment 3 consecutive times without medical reason, if treatment goals not met, if there is a change in medical status, if patient makes no progress towards goals or if patient is discharged from hospital.  The above assessment, treatment plan, treatment alternatives and goals were discussed and mutually agreed upon: by patient  Volanda Napoleon MS, OTR/L  09/15/2020, 9:58 AM

## 2020-09-15 NOTE — Progress Notes (Signed)
PROGRESS NOTE   Subjective/Complaints:  No issues overnight ROS- neg CP SOB  Objective:   No results found. Recent Labs    09/15/20 0527  WBC 7.8  HGB 15.2  HCT 48.2  PLT 271   Recent Labs    09/15/20 0527  NA 139  K 3.7  CL 100  CO2 30  GLUCOSE 112*  BUN 57*  CREATININE 2.17*  CALCIUM 10.1    Intake/Output Summary (Last 24 hours) at 09/15/2020 0857 Last data filed at 09/14/2020 1827 Gross per 24 hour  Intake 240 ml  Output --  Net 240 ml        Physical Exam: Vital Signs Blood pressure 126/86, pulse 80, temperature 98 F (36.7 C), temperature source Oral, resp. rate 18, height 5\' 7"  (1.702 m), weight 100.3 kg, SpO2 91 %.   General: No acute distress Mood and affect are appropriate Heart: Regular rate and rhythm no rubs murmurs or extra sounds Lungs: Clear to auscultation, breathing unlabored, no rales or wheezes Abdomen: Positive bowel sounds, soft nontender to palpation, nondistended Extremities: No clubbing, cyanosis, or edema Skin: No evidence of breakdown, no evidence of rash Neurologic: Cranial nerves II through XII intact, motor strength is 5/5 in right , 2- Left  deltoid, bicep, tricep, 5/5 RIght and 2- left grip, 5/5 R and 3- Left hip flexor, knee extensors, ankle dorsiflexor and plantar flexor Sensory exam normal sensation to light touch and proprioception in bilateral upper and lower extremities  Musculoskeletal: Full range of motion in all 4 extremities. No joint swelling   Assessment/Plan: 1. Functional deficits which require 3+ hours per day of interdisciplinary therapy in a comprehensive inpatient rehab setting.  Physiatrist is providing close team supervision and 24 hour management of active medical problems listed below.  Physiatrist and rehab team continue to assess barriers to discharge/monitor patient progress toward functional and medical goals  Care Tool:  Bathing               Bathing assist       Upper Body Dressing/Undressing Upper body dressing        Upper body assist      Lower Body Dressing/Undressing Lower body dressing            Lower body assist       Toileting Toileting    Toileting assist       Transfers Chair/bed transfer  Transfers assist           Locomotion Ambulation   Ambulation assist              Walk 10 feet activity   Assist           Walk 50 feet activity   Assist           Walk 150 feet activity   Assist           Walk 10 feet on uneven surface  activity   Assist           Wheelchair     Assist               Wheelchair 50 feet with 2 turns activity  Assist            Wheelchair 150 feet activity     Assist          Blood pressure 126/86, pulse 80, temperature 98 F (36.7 C), temperature source Oral, resp. rate 18, height 5\' 7"  (1.702 m), weight 100.3 kg, SpO2 91 %.  Medical Problem List and Plan: 1.  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             -patient may shower             -ELOS/Goals: 12 to 16 days/supervision/min a             Admit to CIR evals today  2.  Antithrombotics: -DVT/anticoagulation:  Pharmaceutical: Lovenox             -antiplatelet therapy: Low dose ASA.  3. Pain Management: Voltaren gel for left shoulder subluxation.              Moderate increased exertion 4. Mood: LCSW to follow for evaluation and support.              -antipsychotic agents: N/A 5. Neuropsych: This patient is capable of making decisions on his own behalf. 6. Skin/Wound Care: Routine pressure relief measures 7. Fluids/Electrolytes/Nutrition: Monitor I/Os. Encourage fluid intake.  8. HTN: Monitor BP tid--continue HCTZ, Norvasc, and metoprolol             Monitor with increased mobility 9. CKD III: Will  monitor renal status with serial checks             --avoid hypoperfusion             --BUN/SCr 21/1.31 at admission-->34/1.42. now 57 and 2.17, IVF at noc             CMP ordered for tomorrow 10. Dyslipidemia: Lipitor 11. Impaired fasting glucose:  Hgb A1c-4.7- normal             -- Elevated fasting BS likely due to stress.will not need CBG monitoring long term              Moderate increased mobility 12. Tobacco abuse             Counsel    LOS: 1 days A FACE TO FACE EVALUATION WAS PERFORMED  08-09-2003 09/15/2020, 8:57 AM

## 2020-09-16 NOTE — Progress Notes (Signed)
PROGRESS NOTE   Subjective/Complaints: Pt slept ok, stated that therapy made him tired yesterday  Labs reviewed  ROS- neg CP SOB  Objective:   No results found. Recent Labs    09/15/20 0527  WBC 7.8  HGB 15.2  HCT 48.2  PLT 271   Recent Labs    09/15/20 0527  NA 139  K 3.7  CL 100  CO2 30  GLUCOSE 112*  BUN 57*  CREATININE 2.17*  CALCIUM 10.1    Intake/Output Summary (Last 24 hours) at 09/16/2020 0847 Last data filed at 09/16/2020 0837 Gross per 24 hour  Intake 2065.84 ml  Output 1200 ml  Net 865.84 ml        Physical Exam: Vital Signs Blood pressure 118/83, pulse 75, temperature 98.3 F (36.8 C), resp. rate 16, height 5\' 7"  (1.702 m), weight 100.3 kg, SpO2 98 %.   General: No acute distress Mood and affect are appropriate Heart: Regular rate and rhythm no rubs murmurs or extra sounds Lungs: Clear to auscultation, breathing unlabored, no rales or wheezes Abdomen: Positive bowel sounds, soft nontender to palpation, nondistended Extremities: No clubbing, cyanosis, or edema Skin: No evidence of breakdown, no evidence of rash   Neurologic: Cranial nerves II through XII intact, motor strength is 5/5 in right , 2- Left  deltoid, bicep, tricep, 5/5 RIght and 2- left grip, 5/5 R and 3- Left hip flexor, knee extensors, ankle dorsiflexor and plantar flexor Sensory exam normal sensation to light touch and proprioception in bilateral upper and lower extremities  Musculoskeletal: Full range of motion in all 4 extremities. No joint swelling   Assessment/Plan: 1. Functional deficits which require 3+ hours per day of interdisciplinary therapy in a comprehensive inpatient rehab setting.  Physiatrist is providing close team supervision and 24 hour management of active medical problems listed below.  Physiatrist and rehab team continue to assess barriers to discharge/monitor patient progress toward functional and  medical goals  Care Tool:  Bathing    Body parts bathed by patient: Left upper leg,Right upper leg,Front perineal area,Abdomen,Chest,Left arm   Body parts bathed by helper: Right arm,Buttocks,Right lower leg,Left lower leg     Bathing assist Assist Level: Moderate Assistance - Patient 50 - 74%     Upper Body Dressing/Undressing Upper body dressing   What is the patient wearing?: Pull over shirt,Hospital gown only    Upper body assist Assist Level: Maximal Assistance - Patient 25 - 49%    Lower Body Dressing/Undressing Lower body dressing      What is the patient wearing?: Pants,Underwear/pull up     Lower body assist Assist for lower body dressing: Maximal Assistance - Patient 25 - 49%     Toileting Toileting    Toileting assist Assist for toileting: Independent     Transfers Chair/bed transfer  Transfers assist     Chair/bed transfer assist level: Moderate Assistance - Patient 50 - 74%     Locomotion Ambulation   Ambulation assist      Assist level: Maximal Assistance - Patient 25 - 49% Assistive device: Walker-rolling Max distance: 12 ft   Walk 10 feet activity   Assist     Assist level:  Maximal Assistance - Patient 25 - 49% Assistive device: Walker-rolling   Walk 50 feet activity   Assist Walk 50 feet with 2 turns activity did not occur: Safety/medical concerns         Walk 150 feet activity   Assist Walk 150 feet activity did not occur: Safety/medical concerns         Walk 10 feet on uneven surface  activity   Assist Walk 10 feet on uneven surfaces activity did not occur: Safety/medical concerns         Wheelchair     Assist Will patient use wheelchair at discharge?: Yes Type of Wheelchair: Manual Wheelchair activity did not occur: Safety/medical concerns         Wheelchair 50 feet with 2 turns activity    Assist    Wheelchair 50 feet with 2 turns activity did not occur: Safety/medical concerns        Wheelchair 150 feet activity     Assist  Wheelchair 150 feet activity did not occur: Safety/medical concerns       Blood pressure 118/83, pulse 75, temperature 98.3 F (36.8 C), resp. rate 16, height 5\' 7"  (1.702 m), weight 100.3 kg, SpO2 98 %.  Medical Problem List and Plan: 1.  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             -patient may shower             -ELOS/Goals: 12 to 16 days/supervision/min a             Admit to CIR evals today  2.  Antithrombotics: -DVT/anticoagulation:  Pharmaceutical: Lovenox             -antiplatelet therapy: Low dose ASA.  3. Pain Management: Voltaren gel for left shoulder subluxation.              Moderate increased exertion 4. Mood: LCSW to follow for evaluation and support.              -antipsychotic agents: N/A 5. Neuropsych: This patient is capable of making decisions on his own behalf. 6. Skin/Wound Care: Routine pressure relief measures 7. Fluids/Electrolytes/Nutrition: Monitor I/Os. Encourage fluid intake.  8. HTN: Monitor BP tid--continue HCTZ, Norvasc, and metoprolol              Vitals:   09/15/20 1932 09/16/20 0427  BP: 128/78 118/83  Pulse: 76 75  Resp: 17 16  Temp: 98.2 F (36.8 C) 98.3 F (36.8 C)  SpO2: 97% 98%   9. CKD III: Will monitor renal status with serial checks             --avoid hypoperfusion             --BUN/SCr 21/1.31 at admission-->34/1.42. now 57 and 2.17, IVF at noc             CMP ordered for tomorrow 10. Dyslipidemia: Lipitor 11. Impaired fasting glucose:  Hgb A1c-4.7- normal             -- Elevated fasting BS likely due to stress.will not need CBG monitoring long term              Moderate increased mobility 12. Tobacco abuse             Counsel    LOS: 2 days A FACE TO FACE EVALUATION WAS PERFORMED  08-09-2003 09/16/2020, 8:47  AM    

## 2020-09-16 NOTE — Progress Notes (Signed)
Occupational Therapy Session Note  Patient Details  Name: Darryl Sullivan MRN: [id number] Date of Birth: 20-Jul-1965  Today's Date: 09/16/2020 OT Individual Time: 7353-2992 OT Individual Time Calculation (min): 42 min    Short Term Goals: Week 1:  OT Short Term Goal 1 (Week 1): Pt will complete toilet transfer min A OT Short Term Goal 2 (Week 1): Min will don shirt with min A OT Short Term Goal 3 (Week 1): Pt will don pants max A OT Short Term Goal 4 (Week 1): Pt will complete standing ADL for >5 min min A  Skilled Therapeutic Interventions/Progress Updates:    Pt greeted semi-reclined in bed and agreeable to OT treatment session. Pt with IV fluids running and unable to get nursing in time to assist with taking off IV so we could shower. Pt agreeable to LB bathing/dressing, then doing upper body ADLs at later OT session. Pt completed bed mobility with HOB elevated and min A to advance L side. Good sitting balance at EOB with overall close supervision. Worked on sit<>stands with RW and hand splint with min A, min A for balance while pt doffing pants, then washing peri-area and buttocks. Pt had been incontinent of urine in the bed so OT recommending trying a brief today. Pt Worked on anterior weight shift with reaching forward to thread pant legs-overall min A. Incorprated forced use of L UE with hand over hand A. Pt completed stand-pivot over to wc without AD ,but using bed rail and min A. Pt left seated in wc with alarm belt on, call bell in reach, and needs met.    Therapy Documentation Precautions:  Precautions Precautions: Fall Precaution Comments: L hemi UE> LE, L AFO, mild dysarthria Restrictions Weight Bearing Restrictions: No Pain: Denies pain  Therapy/Group: Individual Therapy  Valma Cava 09/16/2020, 8:17 AM

## 2020-09-16 NOTE — Progress Notes (Signed)
Occupational Therapy Session Note  Patient Details  Name: Darryl Sullivan MRN: [id number] Date of Birth: 1965-12-30  Today's Date: 09/16/2020 OT Individual Time: 0907-1000 OT Individual Time Calculation (min): 53 min    Short Term Goals: Week 1:  OT Short Term Goal 1 (Week 1): Pt will complete toilet transfer min A OT Short Term Goal 2 (Week 1): Min will don shirt with min A OT Short Term Goal 3 (Week 1): Pt will don pants max A OT Short Term Goal 4 (Week 1): Pt will complete standing ADL for >5 min min A  Skilled Therapeutic Interventions/Progress Updates:    Pt received seated up in w/c,  agreeable to therapy. RN present to remove IV, doffed shirt with close S and donned new shirt with close S and set-up of shirt. Good use of hemi-technique with min VCs. W/c transport to and from gym. Stood x 3 at high-low table to focus on LUE WB/NMR in prep for improved bimanual ADL performance. Completed 1x25 modified push-ups, forearm pulses, and shoulder flexion towel slides. Req seated rest break in between rests and completes STS without AD with overall CGA to min A to block L knee. Seated, massed practice of L shoulder external rotation to hold external target against wall. C/o of minimal shoulder pain with AROM/PROM of L elbow extension, resolves with rest. Able to drink from cup with min A to incorporate LUE. Edu on therapeutic positioning for LUE, provided L half lap tray for w/c. Stand-pivot back to bed with min A to facilitate LLE WS. Doffed B shoes/L AFO total A. Return to supine with min A to progress BLE onto bed. Pt left semi-reclined in bed alarm engaged, call bell in reach, and all immediate needs met.    Therapy Documentation Precautions:  Precautions Precautions: Fall Precaution Comments: L hemi UE> LE, L AFO, mild dysarthria Restrictions Weight Bearing Restrictions: No Pain:   C/o 2/10 L shoulder pain, RN aware ADL: See Care Tool for more details.   Therapy/Group: Individual  Therapy  Volanda Napoleon MS, OTR/L  09/16/2020, 6:46 AM

## 2020-09-16 NOTE — Progress Notes (Signed)
Physical Therapy Session Note  Patient Details  Name: Darryl Sullivan MRN: [id number] Date of Birth: Sep 20, 1965  Today's Date: 09/16/2020 PT Individual Time: 1635-1730 PT Individual Time Calculation (min): 55 min   Short Term Goals: Week 1:  PT Short Term Goal 1 (Week 1): Pt will perform bed mobility with Min A. PT Short Term Goal 2 (Week 1): Pt will perform SPVT with RW and Mod A with no vc. PT Short Term Goal 3 (Week 1): Pt will ambulate 30 feet with RW and Mod A. PT Short Term Goal 4 (Week 1): Pt will initiate w/c mobility training.  Skilled Therapeutic Interventions/Progress Updates:   Pt received supine in bed and agreeable to PT. Supine>sit transfer with min assist to control the LUE. Sit<>stand to pull pants to waist with min assist and clothing management performed by PT. Stand pivot transfer to [facility] with AFO and min assist with moderate cues for stepping strategy.  Pt performed dynamic standing balance while engaged in gross motor task of wii bowling. Pt able to perform 8 frames in standing with supervision assist and 1-BUE support on RW with tactile cues to improve activation and WB through the LLE.    Gait training with RW 2 x 37f with min assist overall to facilitate hip rotation and engage extensors in stance on the LLE as well as moderate cues for sequencing and improved step length on the RLE to allow improved hip/quad activation on the L.   Pt returned to room and performed stand pivot transfer to bed with mi assist. Sit>supine completed with min assist at the LLE, and left supine in bed with call bell in reach and all needs met.         Therapy Documentation Precautions:  Precautions Precautions: Fall Precaution Comments: L hemi UE> LE, L AFO, mild dysarthria Restrictions Weight Bearing Restrictions: No Vital Signs: Therapy Vitals Temp: 98.6 F (37 C) Pulse Rate: 69 Resp: 17 BP: 135/80 Patient Position (if appropriate): Lying Oxygen Therapy SpO2: 99 % O2  Device: Room Air Pain: Pain Assessment Pain Scale: 0-10 Pain Score: 0-No pain    Therapy/Group: Individual Therapy  ALorie Phenix4/01/2021, 5:36 PM

## 2020-09-17 NOTE — Progress Notes (Incomplete)
Patient family member: Murlean Hark authorized by patient to receive valuables listed on green sheet in chart. Valuables released at :

## 2020-09-17 NOTE — Progress Notes (Addendum)
Physical Therapy Session Note  Patient Details  Name: Darryl Sullivan MRN: [id number] Date of Birth: 11/03/1965  Today's Date: 09/17/2020 PT Individual Time: 0800-0905; 6270-3500 PT Individual Time Calculation (min): 65 min , 49 min  Short Term Goals: Week 1:  PT Short Term Goal 1 (Week 1): Pt will perform bed mobility with Min A. PT Short Term Goal 2 (Week 1): Pt will perform SPVT with RW and Mod A with no vc. PT Short Term Goal 3 (Week 1): Pt will ambulate 30 feet with RW and Mod A. PT Short Term Goal 4 (Week 1): Pt will initiate w/c mobility training.  Skilled Therapeutic Interventions/Progress Updates:  tx 1:  Pt resting in bed.  He denied pain.    In flat bed, rolling R with cues for LUE and min assist to position LLE.  In R sidelying, cues for bil knee and hip flexion, then feet off of bed, then pushing up with RUE ; min/mod assist overall for elevation of trunk.  Pt doffed wet shirt and donned dry shirt with min assist.  PT washed pt's back briefly.  Stand pivot transfer to to L wc with mod assist for L knee stability.  Stand pivot to to L to [facility] over toilet with mod assist. Pt continent of bladder.  Pt donned brief nd pants in sitting> standing, with mod assist.  Stand pivot BSC to wc to R with min assist. Hand washing at sink with min assist to include L hand.  neuromuscular re-education via towel under L foot, demo, multimodal cues for seated: L knee flexion/extension, hip abd/adduction.  AAROM L hip rotations clockwise and counterclockwise.    Gait training with LAFO and shoes donned by PT.  Standing with R UE support on bed rail, 5 x 1 each R/L stepping.  Mod cues needed for L knee stability.  Gait training on level tile with RW, x 20' with min/mod assist.  Cues for "standing tall" q L/RLE for hip and knee muscle recruitment improved pt's ability to step with contralateral LE.  At end of session, pt seated in wc with needs at hand and seat belt alarm set.  tx 2:  Pt resting  in bed; he denied pain.  Bed mobility as above.  In sitting, PT donned pt's socks and shoes.  Stand pivot transfer to L to w/c, min/mod assist. Toilet transfer with min/mod assist, using BSC over toilet. Pt continent of bladder. Hand washing, teeth brushing and hair combing with supervision at sink; visual cues for items. Pt used LUE to hold tooth paste.   Gait training with LAFO, R railing in hallway, x 20' with min assist.  Cues for "standing tall" LLE for improvement in L stance stability and = step lengths.  wc propulsion x 100' using hemi technique on level tile, close supervision/min assist for drifting L.  Cues for efficiency and turns.   At end of session, pt requested getting back to bed, as L hemibody "goes numb" if he sits up in wc too long.  Stand pivot transfer w/c> bed to R with min assist.  PT doffed bil shoes and LAFO.  Sit> supine with extra time but spontaneous use of strategies above, close supervision.  Bed alarm set and needs left at hand.      Therapy Documentation Precautions:  Precautions Precautions: Fall Precaution Comments: L hemi UE> LE, L AFO, mild dysarthria Restrictions Weight Bearing Restrictions: No       Therapy/Group: Individual Therapy  Lovetta Condie 09/17/2020, 10:25  AM  

## 2020-09-17 NOTE — Progress Notes (Signed)
Occupational Therapy Session Note  Patient Details  Name: Darryl Sullivan MRN: [id number] Date of Birth: 1966-05-27  Today's Date: 09/17/2020 OT Individual Time: 9924-2683 OT Individual Time Calculation (min): 69 min    Short Term Goals: Week 1:  OT Short Term Goal 1 (Week 1): Pt will complete toilet transfer min A OT Short Term Goal 2 (Week 1): Min will don shirt with min A OT Short Term Goal 3 (Week 1): Pt will don pants max A OT Short Term Goal 4 (Week 1): Pt will complete standing ADL for >5 min min A  Skilled Therapeutic Interventions/Progress Updates:     Pt received in recliner agreeable to OT with no pain reported and agreeable to take shower  ADL:  Pt completes bathing with A to wash B feet, back, RUE with HOH A of LUE, and buttocks at sit to stand level with CGA for sitting balance. Pt able to cross into figure 4 but too tall in shower stall to wash feet. Pt completes UB dressing with CGA and min cuing for hemi strategies to pull shirt past L elbow and push shirt past L shoulder Pt completes LB dressing with A to place LE into figure 4 seated in w/c, but then pt able to thread BLE and advance pants past hips with MIN A for balance and pulling up past L hip Pt completes footwear with MIN A to don R sock d/t R hip tightness impacting reach to toes Pt completes shower/Tub transfer with MIN A stand pivot using a grabb ar Pt completes grooming seated at sink with LUE used with HOH A to hold toothpaste and squeeze onto toothbrush  Pt left at end of session in bed with exit alarm on, call light in reach and all needs met   Therapy Documentation Precautions:  Precautions Precautions: Fall Precaution Comments: L hemi UE> LE, L AFO, mild dysarthria Restrictions Weight Bearing Restrictions: No General:   Vital Signs: Therapy Vitals Temp: 98.6 F (37 C) Pulse Rate: 75 Resp: 16 BP: 127/80 Patient Position (if appropriate): Lying Oxygen Therapy SpO2: 99 % O2 Device: Room  Air Pain: Pain Assessment Pain Scale: 0-10 Pain Score: Asleep ADL: ADL Eating: Set up Where Assessed-Eating: Wheelchair Grooming: Supervision/safety Where Assessed-Grooming: Sitting at sink,Wheelchair Upper Body Bathing: Minimal assistance Where Assessed-Upper Body Bathing: Shower Lower Body Bathing: Moderate cueing Where Assessed-Lower Body Bathing: Shower Upper Body Dressing: Maximal assistance Where Assessed-Upper Body Dressing: Sitting at sink Lower Body Dressing: Maximal assistance Where Assessed-Lower Body Dressing: Sitting at sink Toileting: Moderate assistance Where Assessed-Toileting: Bedside Commode Toilet Transfer: Moderate assistance Toilet Transfer Method: Stand pivot Science writer: Geophysical data processor: Dependent (stedy) Youth worker: Nurse, learning disability    Praxis   Exercises:   Other Treatments:     Therapy/Group: Individual Therapy  Tonny Branch 09/17/2020, 6:52 AM

## 2020-09-18 MED ORDER — ATORVASTATIN CALCIUM 80 MG PO TABS
80.0000 mg | ORAL_TABLET | Freq: Every day | ORAL | Status: DC
  Administered 2020-09-18 – 2020-10-06 (×19): 80 mg via ORAL
  Filled 2020-09-18 (×18): qty 1

## 2020-09-18 MED ORDER — PANTOPRAZOLE SODIUM 40 MG PO TBEC
40.0000 mg | DELAYED_RELEASE_TABLET | Freq: Every day | ORAL | Status: DC
  Administered 2020-09-18 – 2020-10-06 (×19): 40 mg via ORAL
  Filled 2020-09-18 (×18): qty 1

## 2020-09-18 NOTE — Progress Notes (Signed)
PROGRESS NOTE   Subjective/Complaints: No issues overnight  ROS- neg CP SOB  Objective:   No results found. No results for input(s): WBC, HGB, HCT, PLT in the last 72 hours. No results for input(s): NA, K, CL, CO2, GLUCOSE, BUN, CREATININE, CALCIUM in the last 72 hours.  Intake/Output Summary (Last 24 hours) at 09/18/2020 1014 Last data filed at 09/18/2020 0955 Gross per 24 hour  Intake 1024.96 ml  Output 240 ml  Net 784.96 ml        Physical Exam: Vital Signs Blood pressure 120/76, pulse 66, temperature 98.1 F (36.7 C), temperature source Oral, resp. rate 18, height 5\' 7"  (1.702 m), weight 100.3 kg, SpO2 100 %.   General: No acute distress Mood and affect are appropriate Heart: Regular rate and rhythm no rubs murmurs or extra sounds Lungs: Clear to auscultation, breathing unlabored, no rales or wheezes Abdomen: Positive bowel sounds, soft nontender to palpation, nondistended Extremities: No clubbing, cyanosis, or edema Skin: No evidence of breakdown, no evidence of rash Neurologic: Cranial nerves II through XII intact, motor strength is 5/5 in right , 2- Left  deltoid, bicep, tricep, 5/5 RIght and 2- left grip, 5/5 R and 3- Left hip flexor, knee extensors, ankle dorsiflexor and plantar flexor Sensory exam normal sensation to light touch and proprioception in bilateral upper and lower extremities  Musculoskeletal: Full range of motion in all 4 extremities. No joint swelling   Assessment/Plan: 1. Functional deficits which require 3+ hours per day of interdisciplinary therapy in a comprehensive inpatient rehab setting.  Physiatrist is providing close team supervision and 24 hour management of active medical problems listed below.  Physiatrist and rehab team continue to assess barriers to discharge/monitor patient progress toward functional and medical goals  Care Tool:  Bathing    Body parts bathed by patient:  Left upper leg,Right upper leg,Front perineal area,Abdomen,Chest,Left arm   Body parts bathed by helper: Right arm,Buttocks,Right lower leg,Left lower leg     Bathing assist Assist Level: Moderate Assistance - Patient 50 - 74%     Upper Body Dressing/Undressing Upper body dressing   What is the patient wearing?: Pull over shirt    Upper body assist Assist Level: Supervision/Verbal cueing    Lower Body Dressing/Undressing Lower body dressing      What is the patient wearing?: Pants,Underwear/pull up     Lower body assist Assist for lower body dressing: Maximal Assistance - Patient 25 - 49%     Toileting Toileting    Toileting assist Assist for toileting: Minimal Assistance - Patient > 75%     Transfers Chair/bed transfer  Transfers assist     Chair/bed transfer assist level: 2 Helpers     Locomotion Ambulation   Ambulation assist      Assist level: Moderate Assistance - Patient 50 - 74% Assistive device: Walker-rolling Max distance: 20   Walk 10 feet activity   Assist     Assist level: Moderate Assistance - Patient - 50 - 74% Assistive device: Walker-rolling   Walk 50 feet activity   Assist Walk 50 feet with 2 turns activity did not occur: Safety/medical concerns         Walk  150 feet activity   Assist Walk 150 feet activity did not occur: Safety/medical concerns         Walk 10 feet on uneven surface  activity   Assist Walk 10 feet on uneven surfaces activity did not occur: Safety/medical concerns         Wheelchair     Assist Will patient use wheelchair at discharge?: Yes Type of Wheelchair: Manual Wheelchair activity did not occur: Safety/medical concerns  Wheelchair assist level: Minimal Assistance - Patient > 75%      Wheelchair 50 feet with 2 turns activity    Assist    Wheelchair 50 feet with 2 turns activity did not occur: Safety/medical concerns   Assist Level: Minimal Assistance - Patient > 75%    Wheelchair 150 feet activity     Assist  Wheelchair 150 feet activity did not occur: Safety/medical concerns       Blood pressure 120/76, pulse 66, temperature 98.1 F (36.7 C), temperature source Oral, resp. rate 18, height 5\' 7"  (1.702 m), weight 100.3 kg, SpO2 100 %.  Medical Problem List and Plan: 1.  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             -patient may shower             -ELOS/Goals: 12 to 16 days/supervision/min a             CIR PT, OT  2.  Antithrombotics: -DVT/anticoagulation:  Pharmaceutical: Lovenox             -antiplatelet therapy: Low dose ASA.  3. Pain Management: Voltaren gel for left shoulder subluxation.              Moderate increased exertion 4. Mood: LCSW to follow for evaluation and support.              -antipsychotic agents: N/A 5. Neuropsych: This patient is capable of making decisions on his own behalf. 6. Skin/Wound Care: Routine pressure relief measures 7. Fluids/Electrolytes/Nutrition: Monitor I/Os. Encourage fluid intake.  8. HTN: Monitor BP tid--continue HCTZ, Norvasc, and metoprolol              Vitals:   09/17/20 1939 09/18/20 0418  BP: 139/77 120/76  Pulse: 70 66  Resp: 18 18  Temp: 98.2 F (36.8 C) 98.1 F (36.7 C)  SpO2: 98% 100%   9. CKD III: Will monitor renal status with serial checks             --avoid hypoperfusion             --BUN/SCr 21/1.31 at admission-->34/1.42. now 57 and 2.17, IVF at noc             CMP ordered for tomorrow 10. Dyslipidemia: Lipitor 11. Impaired fasting glucose:  Hgb A1c-4.7- normal             -- Elevated fasting BS likely due to stress.will not need CBG monitoring long term              Moderate increased mobility 12. Tobacco abuse             Counsel    LOS: 4 days A FACE TO FACE EVALUATION WAS PERFORMED  08-09-2003 09/18/2020, 10:14 AM

## 2020-09-19 LAB — BASIC METABOLIC PANEL
Anion gap: 7 (ref 5–15)
BUN: 30 mg/dL — ABNORMAL HIGH (ref 6–20)
CO2: 31 mmol/L (ref 22–32)
Calcium: 9.6 mg/dL (ref 8.9–10.3)
Chloride: 99 mmol/L (ref 98–111)
Creatinine, Ser: 1.53 mg/dL — ABNORMAL HIGH (ref 0.61–1.24)
GFR, Estimated: 53 mL/min — ABNORMAL LOW (ref >60)
Glucose, Bld: 105 mg/dL — ABNORMAL HIGH (ref 70–99)
Potassium: 3.2 mmol/L — ABNORMAL LOW (ref 3.5–5.1)
Sodium: 137 mmol/L (ref 135–145)

## 2020-09-19 LAB — CBC
HCT: 41.8 % (ref 39.0–52.0)
Hemoglobin: 13.2 g/dL (ref 13.0–17.0)
MCH: 28.3 pg (ref 26.0–34.0)
MCHC: 31.6 g/dL (ref 30.0–36.0)
MCV: 89.5 fL (ref 80.0–100.0)
Platelets: 252 10*3/uL (ref 150–400)
RBC: 4.67 MIL/uL (ref 4.22–5.81)
RDW: 11.1 % — ABNORMAL LOW (ref 11.5–15.5)
WBC: 7 10*3/uL (ref 4.0–10.5)
nRBC: 0 % (ref 0.0–0.2)

## 2020-09-19 MED ORDER — POTASSIUM CHLORIDE CRYS ER 20 MEQ PO TBCR
40.0000 meq | EXTENDED_RELEASE_TABLET | Freq: Two times a day (BID) | ORAL | Status: DC
  Administered 2020-09-19: 40 meq via ORAL
  Filled 2020-09-19 (×2): qty 2

## 2020-09-19 NOTE — Progress Notes (Signed)
Occupational Therapy Session Note  Patient Details  Name: Darryl Sullivan MRN: [id number] Date of Birth: 1965/11/22  Today's Date: 09/19/2020 OT Individual Time: 2409-7353 OT Individual Time Calculation (min): 32 min    Short Term Goals: Week 1:  OT Short Term Goal 1 (Week 1): Pt will complete toilet transfer min A OT Short Term Goal 2 (Week 1): Min will don shirt with min A OT Short Term Goal 3 (Week 1): Pt will don pants max A OT Short Term Goal 4 (Week 1): Pt will complete standing ADL for >5 min min A  Skilled Therapeutic Interventions/Progress Updates:    Session 1: (2992-4268)  Pt in bed to start, agreeable to therapy.  He was able to transfer to the EOB with mod assist and then complete squat pivot transfer to the wheelchair.  Noted soiled brief with pt stating when he tried to use the urinal, some of the urine backed out of it.  Had him work on standing at the sink with mod assist while completing doffing old brief, completing peri washing, and then donning new brief and pants.  Mod assist was needed for threading items over his LEs and then pulling over hips.  He needed max assist for donning left AFO as well as shoes.  Had him complete oral hygiene in standing with mod assist as well to conclude session.  Call button and phone in reach with safety belt in place at end of the session.    Session 2: (3419-6222)  Pt completed shower and dressing during session.  He was in the bed to start with mod assist for rolling to the left side and transitioning to sitting EOB.  He then ambulated with max assist and no device to the bathroom with max facilitation to avoid left knee buckling.  Min assist for UB bathing with max hand over hand assist for integration of the LUE into washing the right arm.  Min assist for sit to stand with use of the grab bars and min assist for pt to wash his buttocks while therapist provide assist with rinsing.  He completed stand pivot transfer back to the wheelchair for  transition out to the sink for dressing tasks.  Min assist following hemi techniques for donning pullover shirt with mod assist for donning brief and pants.  He then needed max assist for donning gripper socks using one handed technique.  Mod assist transfer stand pivot to the bed per pt request with min assist for transition to supine.  He was left with the call button and phone in reach and safety alarm on.  LUE was positioned on pillows as well.    Therapy Documentation Precautions:  Precautions Precautions: Fall Precaution Comments: L hemi UE> LE, L AFO, mild dysarthria Restrictions Weight Bearing Restrictions: No  Pain: Pain Assessment Pain Scale: 0-10 Pain Score: 0-No pain ADL: See Care Tool Section for some details of mobility and selfcare  Therapy/Group: Individual Therapy  Diamonique Ruedas OTR/L 09/19/2020, 10:40 AM

## 2020-09-19 NOTE — Progress Notes (Incomplete Revision)
Physical Therapy Session Note  Patient Details  Name: Darryl Sullivan MRN: [id number] Date of Birth: 1966/04/24  Today's Date: 09/19/2020 PT Individual Time: 8756-4332 and 9518-8416 PT Individual Time Calculation (min): 62 min and 45 min  Short Term Goals: Week 1:  PT Short Term Goal 1 (Week 1): Pt will perform bed mobility with Min A. PT Short Term Goal 2 (Week 1): Pt will perform SPVT with RW and Mod A with no vc. PT Short Term Goal 3 (Week 1): Pt will ambulate 30 feet with RW and Mod A. PT Short Term Goal 4 (Week 1): Pt will initiate w/c mobility training.  Skilled Therapeutic Interventions/Progress Updates:  Session 1:  Patient *** in *** upon PT arrival. Patient alert and agreeable to PT session. Patient denied pain during session.  Therapeutic Activity: Bed Mobility: Patient performed supine to/from sit with ***. Provided verbal cues for ***. Transfers: Patient performed sit to/from stand x*** with ***. Provided verbal cues for***.  Gait Training:  Patient ambulated *** feet using *** with ***. Ambulated with ***. Provided verbal cues for ***.  Wheelchair Mobility:  Patient propelled wheelchair *** feet with ***. Provided verbal cues for ***  Neuromuscular Re-ed: NMR facilitated during session with focus on***. Pt guided in ***. NMR performed for improvements in motor control and coordination, balance, sequencing, judgement, and self confidence/ efficacy in performing all aspects of mobility at highest level of independence.   Therapeutic Exercise: Patient performed the following exercises with verbal and tactile cues for proper technique. ***  Patient ***  in *** at end of session with brakes locked, *** alarm set, and all needs within reach.  Session 2: Patient *** in *** upon PT arrival. Patient alert and agreeable to PT session. Patient denied pain during session.  Therapeutic Activity: Bed Mobility: Patient performed supine to/from sit with ***. Provided verbal  cues for ***. Transfers: Patient performed sit to/from stand x*** with ***. Provided verbal cues for***.  Gait Training:  Patient ambulated *** feet using *** with ***. Ambulated with ***. Provided verbal cues for ***.  Wheelchair Mobility:  Patient propelled wheelchair *** feet with ***. Provided verbal cues for ***  Neuromuscular Re-ed: NMR facilitated during session with focus on***. Pt guided in ***. NMR performed for improvements in motor control and coordination, balance, sequencing, judgement, and self confidence/ efficacy in performing all aspects of mobility at highest level of independence.   Patient supine in bed at end of session with brakes locked, bed alarm set, and all needs within reach.   Therapy Documentation Precautions:  Precautions Precautions: Fall Precaution Comments: L hemi UE> LE, L AFO, mild dysarthria Restrictions Weight Bearing Restrictions: No  Therapy/Group: Individual Therapy  Loel Dubonnet PT, DPT 09/19/2020, 1:10 PM

## 2020-09-19 NOTE — Progress Notes (Addendum)
Physical Therapy Session Note  Patient Details  Name: Darryl Sullivan MRN: [id number] Date of Birth: 1965-09-23  Today's Date: 09/19/2020 PT Individual Time: 7829-5621 and 3086-5784 PT Individual Time Calculation (min): 62 min and 45 min  Short Term Goals: Week 1:  PT Short Term Goal 1 (Week 1): Pt will perform bed mobility with Min A. PT Short Term Goal 2 (Week 1): Pt will perform SPVT with RW and Mod A with no vc. PT Short Term Goal 3 (Week 1): Pt will ambulate 30 feet with RW and Mod A. PT Short Term Goal 4 (Week 1): Pt will initiate w/c mobility training.  Skilled Therapeutic Interventions/Progress Updates:  Session 1:  Patient seated upright in w/c upon PT arrival. Patient alert and agreeable to PT session. Patient denied pain during session.  Therapeutic Activity: Transfers: Patient performed STS throughout session initially with Min A for power up and improves to CGA with focused training in foot and BUE positioning as well as forward lean for improved biomechanics and unweighting. Provided vc/ tc for technique throughout. SPVT transfers to R side with Min A/ CGA for pivot stepping and positioning. SPVT to L with Min/ Mod A. VC/ tc for posture, pivot stepping, walker mgmt, and positioning.  Gait Training:  Patient ambulated 30' x1/ 60' x1 feet using RW with Mod A for technique, weightshifting, LLE advancement/ placement. Provided verbal cues throughout for technique and sequencing.  Neuromuscular Re-ed: NMR facilitated during session with focus on standing balance and LLE muscle activation. Pt guided in LLE toe touches to 6" step with focus on hip flexion followed by knee extension to reach step, then hip flexion followed by knee flexion in order to prevent sliding foot from step to floor. Pt requires Min A improving to CGA for placement on step and Mod A improving to Min A for clearing foot from step. Pt also guided in minisquats with BUE on RW and focus on equal weight distribution  R to L and equal LE use to complete. Pt requires vc then physical weight shift to improve shift toward LLE to improve muscle activation and use to complete. Pt is able to lower to touch seat and return to standing 2bouts of 5 reps each. NMR performed for improvements in motor control and coordination, balance, sequencing, judgement, and self confidence/ efficacy in performing all aspects of mobility at highest level of independence.   Patient seated upright in w/c at end of session with brakes locked, belt alarm set, and all needs within reach. Pt recommended to sit up in w/c throughout lunch and then to request to lie down to rest prior to afternoon OT and additional PT session. Pt in agreement.   Session 2: Patient supine in bed upon PT arrival. Patient alert and agreeable to PT session outdoors. Patient denied pain during session. Pt's bil shoes and L AFO donned with Max A for time. Pt transported to hospital entrance via w/c with Max A for time. Entire session performed over paved and uneven outdoor surfaces.  Therapeutic Activity: Bed Mobility: Patient performed supine <> sit with CGA and vc for technique. With return to supine, pt noted with RLE assist to bring LLE to bed surface.  Transfers: Patient performed STS throughout session initially with Min A for power up and improves to CGA with focused training in foot and BUE positioning as well as forward lean for improved biomechanics and unweighting. Provided vc/ tc for technique throughout. SPVT transfers to R side with Min A/ CGA for  pivot stepping and positioning. SPVT to L with Min/ Mod A. VC/ tc for posture, pivot stepping, walker mgmt, and positioning. Minisquats performed with no AD and required Min A for midline orientation as well as increased use of LLE.   Gait Training:  Patient ambulated several short distances around 15 feet each over uneven paved ground with upward grade.  Ambulated with RW from seat to seat requiring Min A for L knee  block and initially Mod A improving to CGA for LLE advancement. Provided verbal cues for holding L knee in extension and increasing WB into BUE prior to swing phase and step through of RLE. Additional vc for upright posture, increased hip// knee flexion in LLE swing phase.   Wheelchair Mobility:  Patient propelled wheelchair 75 feet over uneven surfaces with supervision and intermittent CGA/ Min A for obstacle clearance.  Provided verbal cues for technique in turning and propelling.   Patient supine in bed at end of session with brakes locked, bed alarm set, and all needs within reach.   Therapy Documentation Precautions:  Precautions Precautions: Fall Precaution Comments: L hemi UE> LE, L AFO, mild dysarthria Restrictions Weight Bearing Restrictions: No  Therapy/Group: Individual Therapy  Loel Dubonnet PT, DPT 09/19/2020, 1:10 PM

## 2020-09-19 NOTE — Progress Notes (Signed)
PROGRESS NOTE   Subjective/Complaints: Patient's chart reviewed- No issues reported overnight Vitals signs stable except for elevated SBP. K+ 3.2, will supplement Cr 1.53  ROS- neg CP SOB, denies pain  Objective:   No results found. Recent Labs    09/19/20 0651  WBC 7.0  HGB 13.2  HCT 41.8  PLT 252   Recent Labs    09/19/20 0651  NA 137  K 3.2*  CL 99  CO2 31  GLUCOSE 105*  BUN 30*  CREATININE 1.53*  CALCIUM 9.6    Intake/Output Summary (Last 24 hours) at 09/19/2020 1313 Last data filed at 09/19/2020 0720 Gross per 24 hour  Intake 656.97 ml  Output 1025 ml  Net -368.03 ml        Physical Exam: Vital Signs Blood pressure (!) 141/82, pulse 67, temperature 97.7 F (36.5 C), resp. rate 17, height 5\' 7"  (1.702 m), weight 100.3 kg, SpO2 99 %.  Gen: no distress, normal appearing HEENT: oral mucosa pink and moist, NCAT Cardio: Reg rate Chest: normal effort, normal rate of breathing Abd: soft, non-distended Ext: no edema Psych: pleasant, normal affect Skin: intact Neurologic: Cranial nerves II through XII intact, motor strength is 5/5 in right , 2- Left  deltoid, bicep, tricep, 5/5 RIght and 2- left grip, 5/5 R and 3- Left hip flexor, knee extensors, ankle dorsiflexor and plantar flexor Sensory exam normal sensation to light touch and proprioception in bilateral upper and lower extremities  Musculoskeletal: Full range of motion in all 4 extremities. No joint swelling   Assessment/Plan: 1. Functional deficits which require 3+ hours per day of interdisciplinary therapy in a comprehensive inpatient rehab setting.  Physiatrist is providing close team supervision and 24 hour management of active medical problems listed below.  Physiatrist and rehab team continue to assess barriers to discharge/monitor patient progress toward functional and medical goals  Care Tool:  Bathing    Body parts bathed by  patient: Left upper leg,Right upper leg,Front perineal area,Abdomen,Chest,Left arm   Body parts bathed by helper: Right arm,Buttocks,Right lower leg,Left lower leg     Bathing assist Assist Level: Moderate Assistance - Patient 50 - 74%     Upper Body Dressing/Undressing Upper body dressing   What is the patient wearing?: Pull over shirt    Upper body assist Assist Level: Supervision/Verbal cueing    Lower Body Dressing/Undressing Lower body dressing      What is the patient wearing?: Pants,Incontinence brief     Lower body assist Assist for lower body dressing: Moderate Assistance - Patient 50 - 74%     Toileting Toileting    Toileting assist Assist for toileting: Minimal Assistance - Patient > 75%     Transfers Chair/bed transfer  Transfers assist     Chair/bed transfer assist level: Moderate Assistance - Patient 50 - 74% (stand pivot)     Locomotion Ambulation   Ambulation assist      Assist level: Moderate Assistance - Patient 50 - 74% Assistive device: Walker-rolling Max distance: 20   Walk 10 feet activity   Assist     Assist level: Moderate Assistance - Patient - 50 - 74% Assistive device: Walker-rolling   Walk  50 feet activity   Assist Walk 50 feet with 2 turns activity did not occur: Safety/medical concerns         Walk 150 feet activity   Assist Walk 150 feet activity did not occur: Safety/medical concerns         Walk 10 feet on uneven surface  activity   Assist Walk 10 feet on uneven surfaces activity did not occur: Safety/medical concerns         Wheelchair     Assist Will patient use wheelchair at discharge?: Yes Type of Wheelchair: Manual Wheelchair activity did not occur: Safety/medical concerns  Wheelchair assist level: Minimal Assistance - Patient > 75%      Wheelchair 50 feet with 2 turns activity    Assist    Wheelchair 50 feet with 2 turns activity did not occur: Safety/medical  concerns   Assist Level: Minimal Assistance - Patient > 75%   Wheelchair 150 feet activity     Assist  Wheelchair 150 feet activity did not occur: Safety/medical concerns       Blood pressure (!) 141/82, pulse 67, temperature 97.7 F (36.5 C), resp. rate 17, height 5\' 7"  (1.702 m), weight 100.3 kg, SpO2 99 %.  Medical Problem List and Plan: 1.  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             -patient may shower             -ELOS/Goals: 12 to 16 days/supervision/min a             Continue CIR PT, OT  2.  Impaired mobility -DVT/anticoagulation:  Pharmaceutical: Continue Lovenox             -antiplatelet therapy: Low dose ASA.  3. Pain Management: Continue Voltaren gel for left shoulder subluxation.              Moderate increased exertion 4. Mood: LCSW to follow for evaluation and support.              -antipsychotic agents: N/A 5. Neuropsych: This patient is capable of making decisions on his own behalf. 6. Skin/Wound Care: Routine pressure relief measures 7. Fluids/Electrolytes/Nutrition: Monitor I/Os. Encourage fluid intake.  8. HTN: Monitor BP tid--continue HCTZ, Norvasc, and metoprolol              Vitals:   09/18/20 1929 09/19/20 0443  BP: 138/77 (!) 141/82  Pulse: 69 67  Resp: 17 17  Temp: 99.6 F (37.6 C) 97.7 F (36.5 C)  SpO2: 98% 99%   9. CKD III: Will monitor renal status with serial checks             --avoid hypoperfusion             --BUN/SCr 21/1.31 at admission-->34/1.42. now 57 and 2.17, IVF at noc             4/11: 1.53 on 4/11, continue IVF and repeat tomorrow 10. Dyslipidemia: Lipitor 11. Impaired fasting glucose:  Hgb A1c-4.7- normal             -- Elevated fasting BS likely due to stress.will not need CBG monitoring long term              Moderate increased mobility 12. Tobacco abuse             Counsel 13.  Hypokalemia: K+ 3.2, supplement with 6/11 BID today  and repeat tomorrow.     LOS: 5 days A FACE TO FACE EVALUATION WAS PERFORMED  Darryl Sullivan Darryl Sullivan Darryl Sullivan 09/19/2020, 1:13 PM

## 2020-09-20 LAB — BASIC METABOLIC PANEL
Anion gap: 6 (ref 5–15)
BUN: 24 mg/dL — ABNORMAL HIGH (ref 6–20)
CO2: 29 mmol/L (ref 22–32)
Calcium: 9.4 mg/dL (ref 8.9–10.3)
Chloride: 102 mmol/L (ref 98–111)
Creatinine, Ser: 1.44 mg/dL — ABNORMAL HIGH (ref 0.61–1.24)
GFR, Estimated: 57 mL/min — ABNORMAL LOW (ref >60)
Glucose, Bld: 93 mg/dL (ref 70–99)
Potassium: 3.4 mmol/L — ABNORMAL LOW (ref 3.5–5.1)
Sodium: 137 mmol/L (ref 135–145)

## 2020-09-20 MED ORDER — POTASSIUM CHLORIDE CRYS ER 20 MEQ PO TBCR
20.0000 meq | EXTENDED_RELEASE_TABLET | Freq: Every day | ORAL | Status: DC
  Administered 2020-09-20 – 2020-10-07 (×18): 20 meq via ORAL
  Filled 2020-09-20 (×17): qty 1

## 2020-09-20 NOTE — Plan of Care (Signed)
  Problem: Consults Goal: RH STROKE PATIENT EDUCATION Description: See Patient Education module for education specifics  Outcome: Progressing   Problem: RH SAFETY Goal: RH STG ADHERE TO SAFETY PRECAUTIONS W/ASSISTANCE/DEVICE Description: STG Adhere to Safety Precautions With cues/reminders Assistance/Device. Outcome: Progressing   Problem: RH KNOWLEDGE DEFICIT Goal: RH STG INCREASE KNOWLEDGE OF HYPERTENSION Description: Patient will be able to mange HTN with medications and dietary modifications using handouts and educational materials independently Outcome: Progressing Goal: RH STG INCREASE KNOWLEGDE OF HYPERLIPIDEMIA Description: Patient will be able to mange HLD with medications and dietary modifications using handouts and educational materials independently Outcome: Progressing Goal: RH STG INCREASE KNOWLEDGE OF STROKE PROPHYLAXIS Description: Patient will be able to mange secondary stroke risks with medications and dietary modifications using handouts and educational materials independently; ASA Outcome: Progressing

## 2020-09-20 NOTE — Progress Notes (Signed)
Physical Therapy Session Note  Patient Details  Name: Darryl Sullivan MRN: [id number] Date of Birth: Jun 06, 1966  Today's Date: 09/20/2020 PT Individual Time:  -      Short Term Goals: Week 1:  PT Short Term Goal 1 (Week 1): Pt will perform bed mobility with Min A. PT Short Term Goal 2 (Week 1): Pt will perform SPVT with RW and Mod A with no vc. PT Short Term Goal 3 (Week 1): Pt will ambulate 30 feet with RW and Mod A. PT Short Term Goal 4 (Week 1): Pt will initiate w/c mobility training.  Skilled Therapeutic Interventions/Progress Updates:  Session 1: Patient supine in bed lightly sleeping upon PT arrival. Patient easily awaks and is alert and agreeable to PT session. Patient denied pain during session. Pt with IV inplace and running. RN states will need to complete fluid prior to ending IV.   Therapeutic Activity: Bed Mobility: Patient performed supine --> sit with supervision and no vc. Return to supine at EOB with pt using RLE to assist LLE into bed with CGA/ SBA. Provided verbal cues for technique and positioning. Transfers: Patient performed STS and SPVT with Min A throughout session with vc for initial positioning to assist with less need for physical assist. Toilet transfer performed using RW and Min A. Min A for doffing clothing. Max A for change of brief. Discussion with pt re: use of personal pull-up style brief instead for ease of doffing/ donning by himself.  Additional vc for technique and BLE advancement. Min A for clothing mgmt after completion of toileting and cleaning.   Pt ambulated short distances within room with use of RW and Min A for foot advancement with max cueing for sequencing weight shifting, LLE extension in stance phase, RLE step through and walker advancement.  Neuromuscular Re-ed: NMR facilitated during session with focus onstanding balance. Pt guided in midline orientation and LLE motor control/ muscle fascilitation with seated march, knee extensions,  minisquats with lean to L side. When supine, guided in bridging.  NMR performed for improvements in motor control and coordination, balance, sequencing, judgement, and self confidence/ efficacy in performing all aspects of mobility at highest level of independence.   Patient supine in bed at end of session with brakes locked, bed alarm set, and all needs within reach.   Session 2:  Patient supine in bed upon PT arrival. Patient alert and agreeable to PT session. Patient denied pain during session.  Therapeutic Activity: Bed Mobility: Patient performed supine <> sit with supervision. Provided verbal cues for technique and positioning. Transfers: Patient performed STS and SPVT transfers throughout session to/ from various seat heights and surfaces.  Provided vc throughout for technique, sequence of weight shift/ LLE knee extension for RLE advancement in pivot stepping.   Gait Training:  Patient ambulated 62 feet using RW with Min A for Lle advement. Provided vc/ tc throughout for sequencing weight shifting, LLE extension in stance phase, RLE step through and walker advancement.  Neuromuscular Re-ed: NMR facilitated during session with focus on LLE motor control and muscle fascilitation Pt guided in use of NuStep L4 x1min overall. First 3 min with focus on LLE extension with increased IR. Final 7 min with focus on maintaining smooth movement pattern without bottoming out extension and smooth reciprocation within speed variance between 45 and 60 steps/ min throughout. BLE use with light RUE. METs between 1.6 and 2.2 throughout. Reaching 238 steps total.  NMR performed for improvements in motor control and coordination, balance, sequencing,  judgement, and self confidence/ efficacy in performing all aspects of mobility at highest level of independence.   Patient supine in bed with bed in upright seated recliner position at end of session with brakes locked, bed alarm set, and all needs within reach.  Lunch on tray table in front of pt. All items requiring 2 hands to open are set up for pt.     Therapy Documentation Precautions:  Precautions Precautions: Fall Precaution Comments: L hemi UE> LE, L AFO, mild dysarthria Restrictions Weight Bearing Restrictions: No  Therapy/Group: Individual Therapy  Loel Dubonnet PT, DPT 09/20/2020, 6:33 PM

## 2020-09-20 NOTE — Progress Notes (Signed)
PROGRESS NOTE   Subjective/Complaints:  Working with PT, discussed K+ with pt  ROS- neg CP SOB, denies pain  Objective:   No results found. Recent Labs    09/19/20 0651  WBC 7.0  HGB 13.2  HCT 41.8  PLT 252   Recent Labs    09/19/20 0651 09/20/20 0512  NA 137 137  K 3.2* 3.4*  CL 99 102  CO2 31 29  GLUCOSE 105* 93  BUN 30* 24*  CREATININE 1.53* 1.44*  CALCIUM 9.6 9.4    Intake/Output Summary (Last 24 hours) at 09/20/2020 9833 Last data filed at 09/20/2020 0825 Gross per 24 hour  Intake 720 ml  Output 525 ml  Net 195 ml        Physical Exam: Vital Signs Blood pressure 111/71, pulse 71, temperature 99.7 F (37.6 C), temperature source Oral, resp. rate 18, height 5\' 7"  (1.702 m), weight 100.3 kg, SpO2 99 %.  General: No acute distress Mood and affect are appropriate Heart: Regular rate and rhythm no rubs murmurs or extra sounds Lungs: Clear to auscultation, breathing unlabored, no rales or wheezes Abdomen: Positive bowel sounds, soft nontender to palpation, nondistended Extremities: No clubbing, cyanosis, or edema Skin: No evidence of breakdown, no evidence of rash  Neurologic: Cranial nerves II through XII intact, motor strength is 5/5 in right , 2- Left  deltoid, bicep, tricep, 5/5 RIght and 2- left grip, 5/5 R and 3- Left hip flexor, knee extensors, ankle dorsiflexor and plantar flexor Sensory exam normal sensation to light touch and proprioception in bilateral upper and lower extremities  Musculoskeletal: Full range of motion in all 4 extremities. No joint swelling   Assessment/Plan: 1. Functional deficits which require 3+ hours per day of interdisciplinary therapy in a comprehensive inpatient rehab setting.  Physiatrist is providing close team supervision and 24 hour management of active medical problems listed below.  Physiatrist and rehab team continue to assess barriers to discharge/monitor  patient progress toward functional and medical goals  Care Tool:  Bathing    Body parts bathed by patient: Left upper leg,Right upper leg,Front perineal area,Abdomen,Chest,Left arm,Face,Buttocks   Body parts bathed by helper: Right arm,Left lower leg,Right lower leg     Bathing assist Assist Level: Minimal Assistance - Patient > 75%     Upper Body Dressing/Undressing Upper body dressing   What is the patient wearing?: Pull over shirt    Upper body assist Assist Level: Minimal Assistance - Patient > 75%    Lower Body Dressing/Undressing Lower body dressing      What is the patient wearing?: Pants,Incontinence brief     Lower body assist Assist for lower body dressing: Moderate Assistance - Patient 50 - 74%     Toileting Toileting    Toileting assist Assist for toileting: Minimal Assistance - Patient > 75%     Transfers Chair/bed transfer  Transfers assist     Chair/bed transfer assist level: Moderate Assistance - Patient 50 - 74% (stand pivot)     Locomotion Ambulation   Ambulation assist      Assist level: Moderate Assistance - Patient 50 - 74% Assistive device: Walker-rolling Max distance: 20   Walk 10 feet  activity   Assist     Assist level: Moderate Assistance - Patient - 50 - 74% Assistive device: Walker-rolling   Walk 50 feet activity   Assist Walk 50 feet with 2 turns activity did not occur: Safety/medical concerns         Walk 150 feet activity   Assist Walk 150 feet activity did not occur: Safety/medical concerns         Walk 10 feet on uneven surface  activity   Assist Walk 10 feet on uneven surfaces activity did not occur: Safety/medical concerns         Wheelchair     Assist Will patient use wheelchair at discharge?: Yes Type of Wheelchair: Manual Wheelchair activity did not occur: Safety/medical concerns  Wheelchair assist level: Minimal Assistance - Patient > 75%      Wheelchair 50 feet with 2  turns activity    Assist    Wheelchair 50 feet with 2 turns activity did not occur: Safety/medical concerns   Assist Level: Minimal Assistance - Patient > 75%   Wheelchair 150 feet activity     Assist  Wheelchair 150 feet activity did not occur: Safety/medical concerns       Blood pressure 111/71, pulse 71, temperature 99.7 F (37.6 C), temperature source Oral, resp. rate 18, height 5\' 7"  (1.702 m), weight 100.3 kg, SpO2 99 %.  Medical Problem List and Plan: 1.  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             -patient may shower             -ELOS/Goals: 12 to 16 days/supervision/min a             Continue CIR PT, OT  2.  Impaired mobility -DVT/anticoagulation:  Pharmaceutical: Continue Lovenox             -antiplatelet therapy: Low dose ASA.  3. Pain Management: Continue Voltaren gel for left shoulder subluxation.              Moderate increased exertion 4. Mood: LCSW to follow for evaluation and support.              -antipsychotic agents: N/A 5. Neuropsych: This patient is capable of making decisions on his own behalf. 6. Skin/Wound Care: Routine pressure relief measures 7. Fluids/Electrolytes/Nutrition: Monitor I/Os. Encourage fluid intake.  8. HTN: Monitor BP tid--continue HCTZ, Norvasc, and metoprolol              Vitals:   09/19/20 1942 09/20/20 0531  BP: 129/66 111/71  Pulse: 62 71  Resp: 18 18  Temp: 98.5 F (36.9 C) 99.7 F (37.6 C)  SpO2: 99% 99%  controlled 4/12 9. CKD III: Will monitor renal status with serial checks             --avoid hypoperfusion             --BUN/SCr 21/1.31 at admission-->34/1.42. now 57 and 2.17, IVF at noc             4/11: 1.53 on 4/11,repeat BMET improving , hold IVF and monitor  10. Dyslipidemia: Lipitor 11. Impaired fasting glucose:  Hgb A1c-4.7- normal             -- Elevated fasting BS  likely due to stress.will not need CBG monitoring long term  Moderate increased mobility 12. Tobacco abuse             Counsel 13. Hypokalemia: K+ 3.2, supplement with BID today , repeat 3.4 will give daily dose of given pt on HCTZ- recheck later in week     LOS: 6 days A FACE TO FACE EVALUATION WAS PERFORMED  Erick Colace 09/20/2020, 8:33 AM

## 2020-09-20 NOTE — Progress Notes (Signed)
Occupational Therapy Session Note  Patient Details  Name: Darryl Sullivan MRN: [id number] Date of Birth: 1965/06/20  Today's Date: 09/20/2020 OT Individual Time: 7939-0300 OT Individual Time Calculation (min): 56 min + 40 min   Short Term Goals: Week 1:  OT Short Term Goal 1 (Week 1): Pt will complete toilet transfer min A OT Short Term Goal 2 (Week 1): Min will don shirt with min A OT Short Term Goal 3 (Week 1): Pt will don pants max A OT Short Term Goal 4 (Week 1): Pt will complete standing ADL for >5 min min A  Skilled Therapeutic Interventions/Progress Updates:    Session 1 (9233-0076): Pt received semi-reclined in bed, agreeable to therapy. Denies pain and ADL. Came to sitting EOB with min A to progress BLE off bed + use of bed features. Donned/doffed B shoes total A, pt able to doff/donn L sock with close S + one handed technique. Stand-pivot throughout session with min A for balance + use of RW with L grip orthotic. Stood to put laundry in Research scientist (life sciences) with CGA and assist to facilitate LUE WB. Session focus on LUE WB and NMR in prep for improved bimanual ADL performance. Completed 1x20 of the following: lateral leans and shoulder flexion against slight incline. Pt educated on scapular mobility and therapeutic positioning of LUE between therapies.  Returned to bed via stand-pivot same manner as above. Returned to supine with use of bed rail and min A to progress BLE onto bed.  Pt left semi-reclined in bed with bed alarm engaged, call bell in reach, and all immediate needs met.   Session 2 (1350-1430): Pt received semi-reclined in bed with daughter present, agreeable to therapy, denies pain. Came to sitting EOB with close S + use of bed features. Donned B shoes + L AFO max A, doffed L shoe close S. Stand-pivot throughout session with min A for balance + use of RW with L grip orthotic. Stood to retrieve laundry from washer and placed in dryer with CGA to min A for balance. Self-propelled  w/c to and from therapy gym with overall min A for obstacle management and mod VCs for hemi-technique. Session focus on LUE NMR in prep for improved functional use of LUE. Pt completed 2x10 of B shoulder flexion and hold and chest press with 1 lb dowel rod. Utilized Ace wrap to facilitate L grip and mirror for visual feedback. Returned to bed via stand-pivot same manner as above. Returned to supine with use of bed rail and close S  Pt left semi-reclined in bed with bed alarm engaged, call bell in reach, and all immediate needs met.    Therapy Documentation Precautions:  Precautions Precautions: Fall Precaution Comments: L hemi UE> LE, L AFO, mild dysarthria Restrictions Weight Bearing Restrictions: No Pain: see session notes   ADL: See Care Tool for more details.   Therapy/Group: Individual Therapy  Volanda Napoleon MS, OTR/L  09/20/2020, 6:38 AM

## 2020-09-21 NOTE — Progress Notes (Signed)
Occupational Therapy Session Note  Patient Details  Name: Darryl Sullivan MRN: [id number] Date of Birth: 1966/06/11  Today's Date: 09/21/2020 OT Individual Time: 4098-1191 OT Individual Time Calculation (min): 59 min and 33 min   Short Term Goals: Week 1:  OT Short Term Goal 1 (Week 1): Pt will complete toilet transfer min A OT Short Term Goal 2 (Week 1): Min will don shirt with min A OT Short Term Goal 3 (Week 1): Pt will don pants max A OT Short Term Goal 4 (Week 1): Pt will complete standing ADL for >5 min min A  Skilled Therapeutic Interventions/Progress Updates:    Session 1 ([id number]): Pt received semi-reclined in bed, denies pain, agreeable to therapy. Came to sitting EOB with CGA and use of bed features. Stand-pivot > w/c > TTB with no AD and min A to block LLE. + use of bed rail/grab bar. Bathed full-body with min A to bathe RUE and buttocks. Doffed/donned shirt close S, good carryover of hemi techniques. Doffed soiled brief with CGA for STS + use of grab bar. Donned new brief/pants with min A to pull up in back, able to thread BLE via figure four. Doffed/donned B socks with close S via figure 4. Stood to brush teeth with CGA and assist to facilitate LUE WB on sink. Donned B shoes and L AFO max A, pt able to adjust over heels. Stand-pivot to mat same manner as above. Transitioned to supine to focus on LUE NMR. With 1 lb dowel rod and ACE wrap to facilitate L grasp, completed 1x10 B shoulder flexion and hold and chest press. Attempted without RUE as guide and required max A to achieve full AAROM. W/c transport back to room.   Pt left in w/c with safety belt alarm engaged, call bell in reach, and all immediate needs met.     Session 2 [id number]): Pt received seated in w/c, denies pain, agreeable to therapy. Session focus on LUE NMR in prep for improved functional use during bimanual ADL. 1:1 NMES applied to L digit/wrist extensors at the following settings: Incorporated  grasping/releasing of cup and bringing to mouth in prep for self-feeding. Pt req overall max to total A to facilitate grasp/release, able to bring cup to mouth with overall min A.  Ratio 1:3 Rate 35 pps Waveform- Asymmetric Ramp 1.0 Pulse 469 Intensity- 15 clicks Duration -  10 min  No adverse reactions after treatment and is skin intact. Denies pain.  Stand-pivot to bed with CGA and use of bed rail. Doffed R shoe close S, doffed L shoe max A, able to untie/undo AFO straps with close S. Return to supine close S.  Pt left semi-reclined in bed with bed alarm engaged, call bell in reach, and all immediate needs met.    Therapy Documentation Precautions:  Precautions Precautions: Fall Precaution Comments: L hemi UE> LE, L AFO, mild dysarthria Restrictions Weight Bearing Restrictions: No    ADL: See Care Tool for more details. Therapy/Group: Individual Therapy  Volanda Napoleon MS, OTR/L  09/21/2020, 6:43 AM

## 2020-09-21 NOTE — Consult Note (Signed)
Neuropsychological Consultation   Patient:   Darryl Sullivan   DOB:   1966-04-04  MR Number:  [id number]  Location:  [facility] [address] CENTER A [id number] [place] [address] Dept: [phone] Loc: [phone]           Date of Service:   09/21/2020  Start Time:   2 PM End Time:   3 PM  Provider/Observer:  Arley Phenix, Psy.D.       Clinical Neuropsychologist       Billing Code/Service: [phone]  Chief Complaint:    Darryl Sullivan is a 55 year old male in relatively good health other than now been found to have previous small vessel disease and cerebrovascular events as well as obesity.  Patient was admitted to [facility] on 08/28/2020 with left hemiparesis and elevated blood pressure.  Urine drug screen was negative.  Patient had MRI brain on 09/05/2020 showing white matter and basal ganglia infarcts on the right side.  Follow-up CT showed small left basal ganglia hemorrhage as well likely hemorrhagic conversion.  Neurology recommended low-dose aspirin for stroke due to small vessel disease and to have an outpatient sleep study to rule out obstructive sleep apnea.  Patient did have issues with renal function and is scheduled for outpatient work-up for renal disease.  CIR was recommended due to functional decline.  Reason for Service:  Patient referred for neuropsychological consultation due to an extended hospital stay and recent subcortical cerebrovascular event impacting left side motor functioning with significant motor loss for his left lower remedies as well as some motor deficits in left upper extremities.  Below see HPI for the current admission.  HPI: Darryl Sullivan is a 56 year old male in relatively good health who was admitted on 08/28/2020 to [facility] with left hemiparesis and elevated blood pressure.  UDS negative.  History taken from chart review and patient due to slowed cognition.  CT head unremarkable for acute intracranial process and patient received TPA.  CTA head/neck was negative for LVO or stenosis. MRI brain on 09/05/2020 discussed and reviewed with Neurology, showing white matter and basal ganglia infarcts on right. Echocardiogram with ejection fraction of 60-65% with moderate LVH.  Follow up CT personally reviewed, showing small left basal ganglia hemorrhage, likely hemorrhagic conversion. Neurology recommended low dose ASA for stroke due to small vessel disease and outpatient sleep study to rule out OSA.  Hospital course further complicated by AKI and renal ultrasound performed, showing increased cortical echogenicity c/w medical renal disease.  Meds titrated for BP control and metoprolol added tachycardia with activity. He continues to be limited by LUE/LLE weakness affecting ADLs as well as mobility. CIR recommended due to functional decline.  Please see preadmission assessment from earlier today as well.  Current Status:  Patient was awake and alert with some significant dysarthria noted.  Patient potentially has motor deficits creating some of the slurred speech.  However, he showed good word finding abilities and was generally oriented but was rather nave as far as technical aspects of his medical status but was aware that he had had a stroke.  Patient denies any significant depression or anxiety but reports frustration with loss of function and concern about its impact it would have on him and his ability to work in the future.  Behavioral Observation: [name]  presents as a 55 y.o.-year-old Right African American Male who appeared his stated age. his dress was Appropriate  and he was Well Groomed and his manners were Appropriate to the situation.  his participation was indicative of Appropriate behaviors.  There were physical disabilities noted.  he displayed an appropriate level of cooperation and motivation.     Interactions:    Active Appropriate and  Attentive  Attention:   within normal limits and attention span and concentration were age appropriate  Memory:   within normal limits; recent and remote memory intact  Visuo-spatial:  not examined  Speech (Volume):  low  Speech:   normal; slurred  Thought Process:  Coherent and Relevant  Though Content:  WNL; not suicidal and not homicidal  Orientation:   person, place, time/date and situation  Judgment:   Good  Planning:   Fair  Affect:    Appropriate  Mood:    Dysphoric  Insight:   Good  Intelligence:   normal  Medical History:   Past Medical History:  Diagnosis Date  . Hypertension          Patient Active Problem List   Diagnosis Date Noted  . 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 Dyslipidemia   . Stage 3 chronic kidney disease (HCC)   . Essential hypertension   . Acute pain of left shoulder   . Tobacco abuse   . Hypertensive emergency   . Cerebrovascular accident (CVA) (HCC) 09/05/2020     Psychiatric History:  No prior psychiatric history  Family Med/Psych History:  Family History  Problem Relation Age of Onset  . Diabetes Mother      Impression/DX:  Darryl Sullivan is a 55 year old male in relatively good health other than now been found to have previous small vessel disease and cerebrovascular events as well as obesity.  Patient was admitted to [facility] on 08/28/2020 with left hemiparesis and elevated blood pressure.  Urine drug screen was negative.  Patient had MRI brain on 09/05/2020 showing white matter and basal ganglia infarcts on the right side.  Follow-up CT showed small left basal ganglia hemorrhage as well likely hemorrhagic conversion.  Neurology recommended low-dose aspirin for stroke due to small vessel disease and to have an outpatient sleep study to rule out obstructive sleep apnea.  Patient did have issues with renal function and is scheduled for outpatient work-up for renal disease.  CIR was recommended  due to functional decline.  Patient was awake and alert with some significant dysarthria noted.  Patient potentially has motor deficits creating some of the slurred speech.  However, he showed good word finding abilities and was generally oriented but was rather nave as far as technical aspects of his medical status but was aware that he had had a stroke.  Patient denies any significant depression or anxiety but reports frustration with loss of function and concern about its impact it would have on him and his ability to work in the future.          Electronically Signed   _______________________ Arley Phenix, Psy.D. Clinical Neuropsychologist

## 2020-09-21 NOTE — Progress Notes (Signed)
PROGRESS NOTE   Subjective/Complaints:  No new issues  ROS- neg CP SOB, denies pain  Objective:   No results found. Recent Labs    09/19/20 0651  WBC 7.0  HGB 13.2  HCT 41.8  PLT 252   Recent Labs    09/19/20 0651 09/20/20 0512  NA 137 137  K 3.2* 3.4*  CL 99 102  CO2 31 29  GLUCOSE 105* 93  BUN 30* 24*  CREATININE 1.53* 1.44*  CALCIUM 9.6 9.4    Intake/Output Summary (Last 24 hours) at 09/21/2020 0925 Last data filed at 09/21/2020 0726 Gross per 24 hour  Intake 598 ml  Output 1275 ml  Net -677 ml        Physical Exam: Vital Signs Blood pressure 123/72, pulse 60, temperature 97.9 F (36.6 C), temperature source Oral, resp. rate 17, height 5' 7"  (1.702 m), weight 100.3 kg, SpO2 100 %.   General: No acute distress Mood and affect are appropriate Heart: Regular rate and rhythm no rubs murmurs or extra sounds Lungs: Clear to auscultation, breathing unlabored, no rales or wheezes Abdomen: Positive bowel sounds, soft nontender to palpation, nondistended Extremities: No clubbing, cyanosis, or edema Skin: No evidence of breakdown, no evidence of rash Neurologic:  motor strength is 5/5 in right , 2- Left  deltoid, bicep, tricep, 5/5 RIght and 2- left grip, 5/5 R and 3- Left hip flexor, knee extensors, ankle dorsiflexor and plantar flexor Sensory exam normal sensation to light touch and proprioception in bilateral upper and lower extremities  Musculoskeletal: Full range of motion in all 4 extremities. No joint swelling   Assessment/Plan: 1. Functional deficits which require 3+ hours per day of interdisciplinary therapy in a comprehensive inpatient rehab setting.  Physiatrist is providing close team supervision and 24 hour management of active medical problems listed below.  Physiatrist and rehab team continue to assess barriers to discharge/monitor patient progress toward functional and medical  goals  Care Tool:  Bathing    Body parts bathed by patient: Left upper leg,Right upper leg,Front perineal area,Abdomen,Chest,Left arm,Face,Buttocks   Body parts bathed by helper: Right arm,Left lower leg,Right lower leg     Bathing assist Assist Level: Minimal Assistance - Patient > 75%     Upper Body Dressing/Undressing Upper body dressing   What is the patient wearing?: Pull over shirt    Upper body assist Assist Level: Minimal Assistance - Patient > 75%    Lower Body Dressing/Undressing Lower body dressing      What is the patient wearing?: Pants,Incontinence brief     Lower body assist Assist for lower body dressing: Moderate Assistance - Patient 50 - 74%     Toileting Toileting    Toileting assist Assist for toileting: Minimal Assistance - Patient > 75%     Transfers Chair/bed transfer  Transfers assist     Chair/bed transfer assist level: Minimal Assistance - Patient > 75%     Locomotion Ambulation   Ambulation assist      Assist level: Moderate Assistance - Patient 50 - 74% Assistive device: Walker-rolling Max distance: 55 ft   Walk 10 feet activity   Assist     Assist level: Moderate  Assistance - Patient - 50 - 74% Assistive device: Walker-rolling   Walk 50 feet activity   Assist Walk 50 feet with 2 turns activity did not occur: Safety/medical concerns  Assist level: Moderate Assistance - Patient - 50 - 74% Assistive device: Walker-rolling    Walk 150 feet activity   Assist Walk 150 feet activity did not occur: Safety/medical concerns         Walk 10 feet on uneven surface  activity   Assist Walk 10 feet on uneven surfaces activity did not occur: Safety/medical concerns   Assist level: Moderate Assistance - Patient - 50 - 74% Assistive device: Aeronautical engineer Will patient use wheelchair at discharge?: Yes Type of Wheelchair: Manual Wheelchair activity did not occur: Safety/medical  concerns  Wheelchair assist level: Contact Guard/Touching assist,Minimal Assistance - Patient > 75%      Wheelchair 50 feet with 2 turns activity    Assist    Wheelchair 50 feet with 2 turns activity did not occur: Safety/medical concerns   Assist Level: Minimal Assistance - Patient > 75%,Contact Guard/Touching assist (min A with fatigue)   Wheelchair 150 feet activity     Assist  Wheelchair 150 feet activity did not occur: Safety/medical concerns       Blood pressure 123/72, pulse 60, temperature 97.9 F (36.6 C), temperature source Oral, resp. rate 17, height 5' 7"  (1.702 m), weight 100.3 kg, SpO2 100 %.  Medical Problem List and Plan: 1.  Left hemiparesis, deficits with mobility, self-care secondary to right basal ganglia infarct with white matter changes with left basal ganglia punctate hemorrhage post TPA             -patient may shower             -ELOS/Goals: 12 to 16 days/supervision/min a             Continue CIR PT, OT  Team conference today please see physician documentation under team conference tab, met with team  to discuss problems,progress, and goals. Formulized individual treatment plan based on medical history, underlying problem and comorbidities.  2.  Impaired mobility -DVT/anticoagulation:  Pharmaceutical: Continue Lovenox             -antiplatelet therapy: Low dose ASA.  3. Pain Management: Continue Voltaren gel for left shoulder subluxation.              Moderate increased exertion 4. Mood: LCSW to follow for evaluation and support.              -antipsychotic agents: N/A 5. Neuropsych: This patient is capable of making decisions on his own behalf. 6. Skin/Wound Care: Routine pressure relief measures 7. Fluids/Electrolytes/Nutrition: Monitor I/Os. Encourage fluid intake.  8. HTN: Monitor BP tid--continue HCTZ, Norvasc, and metoprolol              Vitals:   09/20/20 2002 09/21/20 0526  BP: (!) 149/90 123/72  Pulse: 67 60  Resp: 18 17  Temp:  98.1 F (36.7 C) 97.9 F (36.6 C)  SpO2: 100% 100%  controlled 4/13 9. CKD III: Will monitor renal status with serial checks             --avoid hypoperfusion             --BUN/SCr 21/1.31 at admission-->34/1.42. now 19 and 2.17, IVF at noc             4/11: 1.53 on 4/11,repeat BMET improving , hold IVF and  monitor  10. Dyslipidemia: Lipitor 11. Impaired fasting glucose:  Hgb A1c-4.7- normal             -- Elevated fasting BS likely due to stress.will not need CBG monitoring long term              Moderate increased mobility 12. Tobacco abuse             Counsel 13. Hypokalemia: K+ 3.2, supplement with 79mq BID today , repeat 3.4 will give daily dose of 257m given pt on HCTZ- recheck later in week     LOS: 7 days A FACE TO FACE EVALUATION WAS PERFORMED  AnCharlett Blake/13/2022, 9:25 AM

## 2020-09-21 NOTE — Progress Notes (Signed)
Physical Therapy Session Note  Patient Details  Name: Darryl Sullivan MRN: [id number] Date of Birth: [birth year]  Today's Date: 09/21/2020 PT Individual Time: 1535-1620 PT Individual Time Calculation (min): 45 min   Short Term Goals: Week 1:  PT Short Term Goal 1 (Week 1): Pt will perform bed mobility with Min A. PT Short Term Goal 2 (Week 1): Pt will perform SPVT with RW and Mod A with no vc. PT Short Term Goal 3 (Week 1): Pt will ambulate 30 feet with RW and Mod A. PT Short Term Goal 4 (Week 1): Pt will initiate w/c mobility training.  Skilled Therapeutic Interventions/Progress Updates:   Pt received supine in bed and agreeable to PT. Supine>sit transfer with supervision assist and cues for control of the LLE. PT donned LAFO. Stand pivot transfers performed with min assist with and without RW and RUE support for safety on RW or WC arm rest.   Gait training performed over unlevel cement sidewalk x 70f with min assist from PT to improve pelvic rotation and facilitate activation of LLE quads and glutes. Seated NMR: LAQ, hip flexion, hip abduction, knee flexion. Each performed with slight manual resistance x 10 BLE.   Pt returned to room and performed stand pivot transfer to bed with min assist as listed above with RW. Sit>supine completed with supervision assist, and left supine in bed with call bell in reach and all needs met.        Therapy Documentation Precautions:  Precautions Precautions: Fall Precaution Comments: L hemi UE> LE, L AFO, mild dysarthria Restrictions Weight Bearing Restrictions: No    Vital Signs: Therapy Vitals Temp: 97.9 F (36.6 C) Pulse Rate: 71 Resp: 17 BP: 132/82 Patient Position (if appropriate): Lying Oxygen Therapy SpO2: 99 % O2 Device: Room Air Pain: denies    Therapy/Group: Individual Therapy  ALorie Phenix4/13/2022, 5:19 PM

## 2020-09-21 NOTE — Progress Notes (Signed)
Patient ID: Darryl Sullivan, male   DOB: [birth year], 55 y.o.   MRN: [id number] Team Conference Report to Patient/Family  Team Conference discussion was reviewed with the patient and caregiver, including goals, any changes in plan of care and target discharge date.  Patient and caregiver express understanding and are in agreement.  The patient has a target discharge date of 10/07/20.  SW met with pt provided conference updates. SW left VM with pt daughter at bedside  Dyanne Iha 09/21/2020, 1:36 PM

## 2020-09-21 NOTE — Patient Care Conference (Signed)
Inpatient RehabilitationTeam Conference and Plan of Care Update Date: 09/21/2020   Time: 10:15 AM    Patient Name: Darryl Sullivan      Medical Record Number: [id number]  Date of Birth: 02/06/1966 Sex: Male         Room/Bed: 4W13C/4W13C-01 Payor Info: Payor: BLUE CROSS BLUE SHIELD / Plan: BCBS COMM PPO / Product Type: *No Product type* /    Admit Date/Time:  09/14/2020 12:36 PM  Primary Diagnosis:  Stroke of right basal ganglia [facility])  Hospital Problems: Principal Problem:   Stroke of right basal ganglia [facility])    Expected Discharge Date: Expected Discharge Date: 10/07/20  Team Members Present: Physician leading conference: Dr. Claudette Laws Care Coodinator Present: Chana Bode, RN, BSN, CRRN;Christina Vita Barley, BSW Nurse Present: Chana Bode, RN PT Present: Other (comment) Sharol Harness, PT) OT Present: Other (comment) Annye English, OT) PPS Coordinator present : Fae Pippin, SLP     Current Status/Progress Goal Weekly Team Focus  Bowel/Bladder   Continent B/B LBM 4/10  Remain continent  Assess PRN, Keep urinal at bedside   Swallow/Nutrition/ Hydration             ADL's   S UB ADL with hemi technique, mod LBD, min toileting, min toilet/shower transfer via stand-pivot with RW, Brunstrum II to III LUE, I to II hand  CGA transfers, S UB/LB ADL  LUE NMR, ADL/balance/functional transfer retraining, activity tolerance, family/pt/DME edu   Mobility             Communication             Safety/Cognition/ Behavioral Observations            Pain   0/10  <3  assess q shift and PRN   Skin   No breakdown noted  Skin remains free of breakdown  Assess skin q shift and PRN     Discharge Planning:  Pt plans to d/c home with nephew (interminttent) or d/c home with spouse (24/7 Supervision) 1 level home, 4 steps to enter   Team Discussion: BP labile per MD, labs WNL and renal function has improved; D/C IVF at [facility].   Patient on target to meet rehab goals: yes,  currently min assist for toileting , shower transfers. Min - mod assist for gait with AFO. And Supervision - CGA goals set for discharge  *See Care Plan and progress notes for long and short-term goals.   Revisions to Treatment Plan:   Teaching Needs: Transfers, toileting, medications, secondary stroke risk management, etc.  Current Barriers to Discharge: Decreased caregiver support and Home enviroment access/layout  Possible Resolutions to Barriers: Home with ex-wife recommended with 24/7 assistance/supervision availability Home with nephew and daughter assisting intermittently is an option Family education    Medical Summary Current Status: fluid intake improvement, BUN/Cr correcting, no pain , BPs improving  Barriers to Discharge: Medical stability   Possible Resolutions to [employer] Focus: recheck BMET later in week, hope to avoid IVF again   Continued Need for Acute Rehabilitation Level of Care: The patient requires daily medical management by a physician with specialized training in physical medicine and rehabilitation for the following reasons: Direction of a multidisciplinary physical rehabilitation program to maximize functional independence : Yes Medical management of patient stability for increased activity during participation in an intensive rehabilitation regime.: Yes Analysis of laboratory values and/or radiology reports with any subsequent need for medication adjustment and/or medical intervention. : Yes   I attest that I was present, lead the team  conference, and concur with the assessment and plan of the team.   Pamelia Hoit 09/21/2020, 1:19 PM

## 2020-09-21 NOTE — Progress Notes (Signed)
Physical Therapy Session Note  Patient Details  Name: Darryl Sullivan MRN: [id number] Date of Birth: 03-02-1966  Today's Date: 09/21/2020 PT Individual Time: 0900-1000  PT Individual Time Calculation (min): 60 min   Short Term Goals: Week 1:  PT Short Term Goal 1 (Week 1): Pt will perform bed mobility with Min A. PT Short Term Goal 2 (Week 1): Pt will perform SPVT with RW and Mod A with no vc. PT Short Term Goal 3 (Week 1): Pt will ambulate 30 feet with RW and Mod A. PT Short Term Goal 4 (Week 1): Pt will initiate w/c mobility training.  Skilled Therapeutic Interventions/Progress Updates:  Patient seated upright in w/c upon PT arrival. Patient alert and agreeable to PT session. Patient denied pain during session. W/c cushion switched for ROHO for improved comfort in order to spend more time in seated position throughout each day.  Therapeutic Activity: Transfers: Patient performed STS and SPVT using RW with Min A/ CGA and continues to require vc/ tc for optimal positioning prior to initiating transfer.   Gait Training:  Patient ambulated short distances in room using RW and Min A for balance and technique/ sequencing. Continues to require vc for maintaining L knee extension with weight shift to LLE prior to RLE advancement.   Neuromuscular Re-ed: NMR facilitated during session with focus on static standing balance. Pt guided in use of BITS for sequencing activity and AROM reach with RUE requiring LUE only for balance. Pt also assisted in use of LUE to reach out for targets with pt initiating all movements and assisted into completion of reach. No LOB throughout and guided into increased distance throughout second bout. Completes sequence of alphabet with 100% accuracy in 35sec with 5.98sec reaction time. Then completes alphanumeric sequence with 96.3% accuracy in 23 sec with 7.36sec reactions time.  NMR performed for improvements in motor control and coordination, balance,  sequencing, judgement, and self confidence/ efficacy in performing all aspects of mobility at highest level of independence.   Patient seated upright in w/c at end of session with brakes locked, bed alarm set, and all needs within reach.   Therapy Documentation Precautions:  Precautions Precautions: Fall Precaution Comments: L hemi UE> LE, L AFO, mild dysarthria Restrictions Weight Bearing Restrictions: No   Therapy/Group: Individual Therapy  Loel Dubonnet PT, DPT 09/21/2020, 9:12 AM

## 2020-09-22 NOTE — Progress Notes (Signed)
PROGRESS NOTE   Subjective/Complaints:  Discussed d/c date, PT has noted sublux , applying kinesiotape   ROS- neg CP SOB, denies pain  Objective:   No results found. No results for input(s): WBC, HGB, HCT, PLT in the last 72 hours. Recent Labs    09/20/20 0512  NA 137  K 3.4*  CL 102  CO2 29  GLUCOSE 93  BUN 24*  CREATININE 1.44*  CALCIUM 9.4    Intake/Output Summary (Last 24 hours) at 09/22/2020 0827 Last data filed at 09/22/2020 0522 Gross per 24 hour  Intake 474 ml  Output 500 ml  Net -26 ml        Physical Exam: Vital Signs Blood pressure 140/79, pulse 70, temperature 98.6 F (37 C), resp. rate 17, height 5' 7"  (1.702 m), weight 100.3 kg, SpO2 99 %.   General: No acute distress Mood and affect are appropriate Heart: Regular rate and rhythm no rubs murmurs or extra sounds Lungs: Clear to auscultation, breathing unlabored, no rales or wheezes Abdomen: Positive bowel sounds, soft nontender to palpation, nondistended Extremities: No clubbing, cyanosis, or edema Skin: No evidence of breakdown, no evidence of rash Neurologic:  motor strength is 5/5 in right , 2- Left  deltoid, bicep, tricep, 5/5 RIght and 2- left grip, 5/5 R and 3- Left hip flexor, knee extensors, 0 ankle dorsiflexor and trace plantar flexor Sensory exam normal sensation to light touch and proprioception in bilateral upper and lower extremities  Musculoskeletal: Full range of motion in all 4 extremities. No joint swelling, no pain with AROM left shoulder    Assessment/Plan: 1. Functional deficits which require 3+ hours per day of interdisciplinary therapy in a comprehensive inpatient rehab setting.  Physiatrist is providing close team supervision and 24 hour management of active medical problems listed below.  Physiatrist and rehab team continue to assess barriers to discharge/monitor patient progress toward functional and medical  goals  Care Tool:  Bathing    Body parts bathed by patient: Left upper leg,Right upper leg,Front perineal area,Abdomen,Chest,Left arm,Face,Buttocks   Body parts bathed by helper: Right arm,Buttocks     Bathing assist Assist Level: Minimal Assistance - Patient > 75%     Upper Body Dressing/Undressing Upper body dressing   What is the patient wearing?: Pull over shirt    Upper body assist Assist Level: Supervision/Verbal cueing    Lower Body Dressing/Undressing Lower body dressing      What is the patient wearing?: Pants,Incontinence brief     Lower body assist Assist for lower body dressing: Minimal Assistance - Patient > 75%     Toileting Toileting    Toileting assist Assist for toileting: Minimal Assistance - Patient > 75%     Transfers Chair/bed transfer  Transfers assist     Chair/bed transfer assist level: Minimal Assistance - Patient > 75%     Locomotion Ambulation   Ambulation assist      Assist level: Moderate Assistance - Patient 50 - 74% Assistive device: Walker-rolling Max distance: 55 ft   Walk 10 feet activity   Assist     Assist level: Moderate Assistance - Patient - 50 - 74% Assistive device: Walker-rolling   Walk  50 feet activity   Assist Walk 50 feet with 2 turns activity did not occur: Safety/medical concerns  Assist level: Moderate Assistance - Patient - 50 - 74% Assistive device: Walker-rolling    Walk 150 feet activity   Assist Walk 150 feet activity did not occur: Safety/medical concerns         Walk 10 feet on uneven surface  activity   Assist Walk 10 feet on uneven surfaces activity did not occur: Safety/medical concerns   Assist level: Moderate Assistance - Patient - 50 - 74% Assistive device: Aeronautical engineer Will patient use wheelchair at discharge?: Yes Type of Wheelchair: Manual Wheelchair activity did not occur: Safety/medical concerns  Wheelchair assist level:  Contact Guard/Touching assist,Minimal Assistance - Patient > 75%      Wheelchair 50 feet with 2 turns activity    Assist    Wheelchair 50 feet with 2 turns activity did not occur: Safety/medical concerns   Assist Level: Minimal Assistance - Patient > 75%,Contact Guard/Touching assist (min A with fatigue)   Wheelchair 150 feet activity     Assist  Wheelchair 150 feet activity did not occur: Safety/medical concerns       Blood pressure 140/79, pulse 70, temperature 98.6 F (37 C), resp. rate 17, height 5' 7"  (1.702 m), weight 100.3 kg, SpO2 99 %.  Medical Problem List and Plan: 1.  Left hemiparesis, deficits with mobility, self-care secondary to right basal ganglia infarct with white matter changes with left basal ganglia punctate hemorrhage post TPA             -patient may shower             -ELOS/Goals: 4/29/supervision/min a             Continue CIR PT, OT  Team conference today please see physician documentation under team conference tab, met with team  to discuss problems,progress, and goals. Formulized individual treatment plan based on medical history, underlying problem and comorbidities.  2.  Impaired mobility -DVT/anticoagulation:  Pharmaceutical: Continue Lovenox             -antiplatelet therapy: Low dose ASA.  3. Pain Management: Continue Voltaren gel for left shoulder subluxation.              Moderate increased exertion 4. Mood: LCSW to follow for evaluation and support.              -antipsychotic agents: N/A 5. Neuropsych: This patient is capable of making decisions on his own behalf. 6. Skin/Wound Care: Routine pressure relief measures 7. Fluids/Electrolytes/Nutrition: Monitor I/Os. Encourage fluid intake.  8. HTN: Monitor BP tid--continue HCTZ, Norvasc, and metoprolol              Vitals:   09/21/20 1929 09/22/20 0522  BP: (!) 152/80 140/79  Pulse: 74 70  Resp: 18 17  Temp: 98.3 F (36.8 C) 98.6 F (37 C)  SpO2: 97% 99%  controlled 4/14 9.  CKD III: Will monitor renal status with serial checks             --avoid hypoperfusion             --BUN/SCr 21/1.31 at admission-->34/1.42. now 72 and 2.17, IVF at noc             4/11: 1.53 on 4/11,repeat BMET improving , hold IVF and monitor  10. Dyslipidemia: Lipitor 11. Impaired fasting glucose:  Hgb A1c-4.7- normal             --  Elevated fasting BS likely due to stress.will not need CBG monitoring long term              Moderate increased mobility 12. Tobacco abuse             Counsel 13. Hypokalemia: K+ 3.2, supplement with 66mq BID today , repeat 3.4 will give daily dose of 26m given pt on HCTZ- recheck in am     LOS: 8 days A FACE TO FACE EVALUATION WAS PERFORMED  AnCharlett Blake/14/2022, 8:27 AM

## 2020-09-22 NOTE — Progress Notes (Signed)
Occupational Therapy Session Note  Patient Details  Name: Darryl Sullivan MRN: [id number] Date of Birth: 03/27/1966  Today's Date: 09/22/2020 OT Individual Time: 7331-2508 OT Individual Time Calculation (min): 43 min    Short Term Goals: Week 1:  OT Short Term Goal 1 (Week 1): Pt will complete toilet transfer min A OT Short Term Goal 2 (Week 1): Min will don shirt with min A OT Short Term Goal 3 (Week 1): Pt will don pants max A OT Short Term Goal 4 (Week 1): Pt will complete standing ADL for >5 min min A  Skilled Therapeutic Interventions/Progress Updates:    Pt received seated in recliner, denies pain, agreeable to therapy. Session focus on LUE NMR/WB in prep for improved bimanual ADL performance. Donned pants with min A to thread RLE over shoe. STS with RW with CGA, min A to advance LLE to amb to w/c. Stand-pivot throughout session with min A to advance LLE and for balance. Self-propelled w/c for ~15 ft with min VCs for hemi tech, transported rest of the way to gym 2/2 fatigue. Attempted to achieve quadruped position on mat, pt with LOB onto mat, req A to roll to his side and coming back up to sitting edge of mat with overall mod A. Denies pain. Completed passed practiced of lateral leans to the L, L shoulder flexion and external rotation with UE ranger. Noted increased digit flexion activation this date. Amb transfer back to bed same manner as above. Doffed B shoes max A 2/2 time. Return to supine with close S    Pt left semi-reclined in bed with bed alarm engaged, call bell in reach, and all immediate needs met.    Therapy Documentation Precautions:  Precautions Precautions: Fall Precaution Comments: L hemi UE> LE, L AFO, mild dysarthria Restrictions Weight Bearing Restrictions: No Pain:   denies ADL: See Care Tool for more details.   Therapy/Group: Individual Therapy  Volanda Napoleon MS, OTR/L  09/22/2020, 6:50 AM

## 2020-09-22 NOTE — Progress Notes (Signed)
Physical Therapy Weekly Progress Note  Patient Details  Name: Darryl Sullivan MRN: [id number] Date of Birth: December 25, 1965  Beginning of progress report period: September 15, 2020 End of progress report period: September 22, 2020  Today's Date: 09/22/2020 PT Individual Time: 0301-3143 1017-1100 PT Individual Time Calculation (min): 74 min  And 55mn  Patient has met 4 of 4 short term goals.  Pt is making steady progress towards LTG of Supervision assist overall for mobility. Currently, pt requires supervision assist from PT for bed mobility, min assist for gait transfers and mod assist for stair management.  Patient continues to demonstrate the following deficits muscle weakness, muscle joint tightness and muscle paralysis, decreased cardiorespiratoy endurance, impaired timing and sequencing, abnormal tone, unbalanced muscle activation and decreased coordination and decreased sitting balance, decreased standing balance, decreased postural control, hemiplegia and decreased balance strategies and therefore will continue to benefit from skilled PT intervention to increase functional independence with mobility.  Patient progressing toward long term goals..  Continue plan of care.  PT Short Term Goals Week 1:  PT Short Term Goal 1 (Week 1): Pt will perform bed mobility with Min A. PT Short Term Goal 1 - Progress (Week 1): Met PT Short Term Goal 2 (Week 1): Pt will perform SPVT with RW and Mod A with no vc. PT Short Term Goal 2 - Progress (Week 1): Met PT Short Term Goal 3 (Week 1): Pt will ambulate 30 feet with RW and Mod A. PT Short Term Goal 3 - Progress (Week 1): Met PT Short Term Goal 4 (Week 1): Pt will initiate w/c mobility training. PT Short Term Goal 4 - Progress (Week 1): Met Week 2:  PT Short Term Goal 1 (Week 2): Pt will ambulate 1022fwth min assist and LRAD PT Short Term Goal 2 (Week 2): [facility] and LR[facility] PT Short Term Goal 3 (Week 2): Pt will propell WC >15079fwith supervision assist  Skilled Therapeutic Interventions/Progress Updates:  Session 1  Pt received supine in bed and agreeable to PT. Supine>sit transfer with supervision assist and ues of UE support on rail rail. Pt reports pain in the L shoulder 5/10. PT noted mild ~1/2 finger sublux. KT tape applied to stabilize  L shoulder.  Stand pivot transfer training with RW x 5 throughout session with min assist for safety. Gait training with RW x [id number] min assist and facilitation to improve pelvic rotation on the L and increased gluteal actvation on the L. Sit>stand with emphasis on WB through the LLE. Lateral reach on the L with UE pressing through L thigh   Dynamic gait training with UE support on rail to perform lateral side sitting x 12 ft bil with min assist from PT to prevent compensations and improved activation of L side gluteals. Pt returned to room and performed stand pivot transfer to bed with min assist and RW. Sit>supine completed with supervision assist, and left supine in bed with call bell in reach and all needs met.   Session 2.  Pt received supine in bed and agreeable to PT. Supine>sit transfer with supervision  Assist as listed above. Stand pivot tranfers performed with and without AD and min assist from PT for safety with cues for improved use of LLE knee extension to prevent LOB.   Kinetron seated march x 2 min. Static standing 2 x 1 minute. Marching in standing 2min62m2. Visual feedback from mirror throughout with resistance set at  20 cm/sec. Cues for improved attention to the LLE and improved gluteal activation in stance.   Gait training with RW x 72f with min assist overall and tactile cues to prevent GR in stance and verbal cues for improved engagement of the quads in stance.  Pt left sitting in recliner with call bell in reach and all needs met.          Therapy Documentation Precautions:  Precautions Precautions: Fall Precaution Comments: L hemi UE> LE, L AFO, mild  dysarthria Restrictions Weight Bearing Restrictions: No Pain: Pain Assessment Pain Scale: 0-10 Pain Score: 5  Pain Location: Shoulder Pain Orientation: Left Pain Descriptors / Indicators: Aching Pain Frequency: Occasional Pain Onset: Gradual Pain Intervention(s): Medication (See eMAR)   Therapy/Group: Individual Therapy  ALorie Phenix4/14/2022, 10:12 AM

## 2020-09-22 NOTE — Progress Notes (Signed)
Occupational Therapy Session Note  Patient Details  Name: Darryl Sullivan MRN: [id number] Date of Birth: Apr 20, 1966  Today's Date: 09/22/2020 OT Individual Time: 8891-6945 OT Individual Time Calculation (min): 33 min    Short Term Goals: Week 1:  OT Short Term Goal 1 (Week 1): Pt will complete toilet transfer min A OT Short Term Goal 2 (Week 1): Min will don shirt with min A OT Short Term Goal 3 (Week 1): Pt will don pants max A OT Short Term Goal 4 (Week 1): Pt will complete standing ADL for >5 min min A  Skilled Therapeutic Interventions/Progress Updates:    Pt in bed to start session with reports of some left shoulder pain with PROM completed in supine.  He was only able to tolerate to approximately 90 degrees before pain increased.  Manual facilitation of humerus did not reduce pain.  Had him transfer from supine to sit with mod assist with therapist providing NMES to the digits extensors.  He was able to tolerate intensity at level 30 while working on functional reach with elbow extension.  Mod assist needed to facilitate extension to target.  Time on 10 seconds with time off 5 seconds.  He was able to tolerate for 15 mins.  Also placed LUE beside of him on the bed with max assist to maintain weightbearing while working on transitions from left lean to sitting.  Mod facilitation to avoid activation of the right trunk and concentrate on pushing back up with the elbow extensors.  No adverse reactions noted with return to the bed at min assist.  Call button and phone in reach with the LUE positioned on pillows.   Therapy Documentation Precautions:  Precautions Precautions: Fall Precaution Comments: L hemi UE> LE, L AFO, mild dysarthria Restrictions Weight Bearing Restrictions: No  Pain: Pain Assessment Pain Scale: Faces Pain Score: 0-No pain ADL: See Care Tool Section for some details of mobility and selfcare  Therapy/Group: Individual Therapy  Tanner Yeley OTR/L 09/22/2020, 4:09  PM

## 2020-09-23 LAB — BASIC METABOLIC PANEL
Anion gap: 7 (ref 5–15)
BUN: 25 mg/dL — ABNORMAL HIGH (ref 6–20)
CO2: 30 mmol/L (ref 22–32)
Calcium: 9.8 mg/dL (ref 8.9–10.3)
Chloride: 100 mmol/L (ref 98–111)
Creatinine, Ser: 1.57 mg/dL — ABNORMAL HIGH (ref 0.61–1.24)
GFR, Estimated: 52 mL/min — ABNORMAL LOW (ref >60)
Glucose, Bld: 107 mg/dL — ABNORMAL HIGH (ref 70–99)
Potassium: 3.7 mmol/L (ref 3.5–5.1)
Sodium: 137 mmol/L (ref 135–145)

## 2020-09-23 LAB — GLUCOSE, CAPILLARY: Glucose-Capillary: 119 mg/dL — ABNORMAL HIGH (ref 70–99)

## 2020-09-23 NOTE — Progress Notes (Signed)
Physical Therapy Session Note  Patient Details  Name: Darryl Sullivan MRN: [id number] Date of Birth: 07-Aug-1965  Today's Date: 09/23/2020 PT Group Time: 1259-1400 PT Group Time Calculation (min): 61 min  Short Term Goals: Week 2:  PT Short Term Goal 1 (Week 2): Pt will ambulate 144f wth min assist and LRAD PT Short Term Goal 2 (Week 2): [facility] PT Short Term Goal 3 (Week 2): Pt will propell WC >1567fwith supervision assist Week 3:     Skilled Therapeutic Interventions/Progress Updates:   Pt received supine in bed and agreeable to PT. Supine>sit transfer with supervision assist and cues for safety. PT donned shoes and AFO once sitting EOB with total A. Stand pivot transfer to [facility].   Pt transported to day room for group therapy circuit  training:   Gait training with RW to weave through 4 cones with RW and min assist. Cues for improved safety with AD management, step length and safety in turns. Noted to have adequate terminal knee extension on the LLE 75% of steps.   Seated UE therex x 5 min total to perform tball lift and trunk rotation x 1 min each, toss and catch, performed with max assist to stabilize the LUE troughout due to hemiplegia.  Nustep activity tolerance training BLE only x 5 minutes, level 5 with thigh support bar for safety. Pt noted to take several short rest breaks throughout due to fatigue.   Kinetron BLE reciprocal movement training x 5 min, 30cm/sec. Assist intermittently to improve position of the LLE into neutral ER/IR and improve activation of gluteals.   Sit<>stand and stand pivot transfer with min assist throughout and RUE supported on arm rest for safety. Poor activation of the LLE in stance with all transfers throughout this session  Pt returned to room and performed stand pivot transfer to bed with no Ad andmin assist. Sit>supine completed with supervision assist and increased time. Pt left supine in bed with  call bell in reach and all needs met.         Therapy Documentation Precautions:  Precautions Precautions: Fall Precaution Comments: L hemi UE> LE, L AFO, mild dysarthria Restrictions Weight Bearing Restrictions: No General:   Vital Signs: Therapy Vitals Temp: 98.8 F (37.1 C) Temp Source: Oral Pulse Rate: 66 Resp: 18 BP: 119/74 Patient Position (if appropriate): Lying Oxygen Therapy SpO2: 99 % O2 Device: Room Air Pain: Pain Assessment Pain Scale: 0-10 Pain Score: 0-No pain   Therapy/Group: Group Therapy  AuLorie Phenix/15/2022, 3:51 PM

## 2020-09-23 NOTE — Progress Notes (Signed)
Occupational Therapy Session Note  Patient Details  Name: Darryl Sullivan MRN: [id number] Date of Birth: 10/15/1965  Today's Date: 09/23/2020 OT Individual Time: 1430-1500 OT Individual Time Calculation (min): 30 min    Short Term Goals: Week 2:  OT Short Term Goal 1 (Week 2): Pt will demonstrate improved LUE management during functional transfers with min VCs. OT Short Term Goal 2 (Week 2): Pt will complete STS in prep for standing ADL with close S + AE PRN. OT Short Term Goal 3 (Week 2): Pt will don/doff B shoes with min A.  Skilled Therapeutic Interventions/Progress Updates:    Pt received in bed resting as he had just finished a PT group. Pt agreeable to therapy.  Focus of session on LUE NMR.  With bed flat, pt scooted up in bed so bed could be adjusted in chair position so pt fully upright.  Worked on scapular protraction/ retraction with PROM followed by a/arom and then "punching" arm forward. Used powder board for a/arom with sliding arm up and down board and in circles.  Pt initially needing significant hand over hand A to achieve range due to increased tone and bicep tightness. He was able to relax and allow for smoother movement with A.  Pt able to slightly push arm up on board himself.  To focus on triceps (to work as an antagonist against tight biceps), had pt back in supine flat with no pillow. Used dowel bar in B hands with A to hold left hand on bar. Pt worked on pushing bar up and holding isometrically. Moved on to L arm only with reaching arm to ceiling with A.  Then placing and holding with hand reaching to ceiling and arm up at sh height. Pt did very well with this, holding tricep in extension for several seconds at a time, then letting hand fall and pushing it back up. Repeated this several times.   Pt participated very well. All needs met.  Bed alarm set.   Therapy Documentation Precautions:  Precautions Precautions: Fall Precaution Comments: L hemi UE> LE, L AFO, mild  dysarthria Restrictions Weight Bearing Restrictions: No    Vital Signs: Therapy Vitals Temp: 98.8 F (37.1 C) Temp Source: Oral Pulse Rate: 66 Resp: 18 BP: 119/74 Patient Position (if appropriate): Lying Oxygen Therapy SpO2: 99 % O2 Device: Room Air Pain: Pain Assessment Pain Scale: 0-10 Pain Score: 0-No pain    Therapy/Group: Individual Therapy  SAGUIER,JULIA 09/23/2020, 4:09 PM 

## 2020-09-23 NOTE — Progress Notes (Signed)
PROGRESS NOTE   Subjective/Complaints:  amb with PT this am , no pain c/o.  Needs cuing to active L quad, using RW,    ROS- neg CP SOB, denies pain  Objective:   No results found. No results for input(s): WBC, HGB, HCT, PLT in the last 72 hours. No results for input(s): NA, K, CL, CO2, GLUCOSE, BUN, CREATININE, CALCIUM in the last 72 hours.  Intake/Output Summary (Last 24 hours) at 09/23/2020 0902 Last data filed at 09/23/2020 0717 Gross per 24 hour  Intake 474 ml  Output 700 ml  Net -226 ml        Physical Exam: Vital Signs Blood pressure (!) 151/84, pulse 76, temperature 98.1 F (36.7 C), temperature source Oral, resp. rate 18, height 5\' 7"  (1.702 m), weight 100.3 kg, SpO2 99 %.  General: No acute distress Mood and affect are appropriate Heart: Regular rate and rhythm no rubs murmurs or extra sounds Lungs: Clear to auscultation, breathing unlabored, no rales or wheezes Abdomen: Positive bowel sounds, soft nontender to palpation, nondistended Extremities: No clubbing, cyanosis, or edema Skin: No evidence of breakdown, no evidence of rash  Neurologic:  motor strength is 5/5 in right , 2- Left  deltoid, bicep, tricep, 5/5 RIght and 2- left grip, 5/5 R and 3- Left hip flexor, knee extensors, 0 ankle dorsiflexor and trace plantar flexor Sensory exam normal sensation to light touch and proprioception in bilateral upper and lower extremities  Musculoskeletal: Full range of motion in all 4 extremities. No joint swelling, no pain with AROM left shoulder    Assessment/Plan: 1. Functional deficits which require 3+ hours per day of interdisciplinary therapy in a comprehensive inpatient rehab setting.  Physiatrist is providing close team supervision and 24 hour management of active medical problems listed below.  Physiatrist and rehab team continue to assess barriers to discharge/monitor patient progress toward functional  and medical goals  Care Tool:  Bathing    Body parts bathed by patient: Left upper leg,Right upper leg,Front perineal area,Abdomen,Chest,Left arm,Face,Buttocks   Body parts bathed by helper: Right arm,Buttocks     Bathing assist Assist Level: Minimal Assistance - Patient > 75%     Upper Body Dressing/Undressing Upper body dressing   What is the patient wearing?: Pull over shirt    Upper body assist Assist Level: Supervision/Verbal cueing    Lower Body Dressing/Undressing Lower body dressing      What is the patient wearing?: Pants     Lower body assist Assist for lower body dressing: Minimal Assistance - Patient > 75%     Toileting Toileting    Toileting assist Assist for toileting: Minimal Assistance - Patient > 75%     Transfers Chair/bed transfer  Transfers assist     Chair/bed transfer assist level: Minimal Assistance - Patient > 75%     Locomotion Ambulation   Ambulation assist      Assist level: Moderate Assistance - Patient 50 - 74% Assistive device: Walker-rolling Max distance: 55 ft   Walk 10 feet activity   Assist     Assist level: Moderate Assistance - Patient - 50 - 74% Assistive device: Walker-rolling   Walk 50 feet activity  Assist Walk 50 feet with 2 turns activity did not occur: Safety/medical concerns  Assist level: Moderate Assistance - Patient - 50 - 74% Assistive device: Walker-rolling    Walk 150 feet activity   Assist Walk 150 feet activity did not occur: Safety/medical concerns         Walk 10 feet on uneven surface  activity   Assist Walk 10 feet on uneven surfaces activity did not occur: Safety/medical concerns   Assist level: Moderate Assistance - Patient - 50 - 74% Assistive device: Photographer Will patient use wheelchair at discharge?: Yes Type of Wheelchair: Manual Wheelchair activity did not occur: Safety/medical concerns  Wheelchair assist level: Contact  Guard/Touching assist,Minimal Assistance - Patient > 75%      Wheelchair 50 feet with 2 turns activity    Assist    Wheelchair 50 feet with 2 turns activity did not occur: Safety/medical concerns   Assist Level: Minimal Assistance - Patient > 75%,Contact Guard/Touching assist (min A with fatigue)   Wheelchair 150 feet activity     Assist  Wheelchair 150 feet activity did not occur: Safety/medical concerns       Blood pressure (!) 151/84, pulse 76, temperature 98.1 F (36.7 C), temperature source Oral, resp. rate 18, height 5\' 7"  (1.702 m), weight 100.3 kg, SpO2 99 %.  Medical Problem List and Plan: 1.  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  Impaired mobility -DVT/anticoagulation:  Pharmaceutical: Continue Lovenox             -antiplatelet therapy: Low dose ASA.  3. Pain Management: Continue Voltaren gel for left shoulder subluxation.              Moderate increased exertion 4. Mood: LCSW to follow for evaluation and support.              -antipsychotic agents: N/A 5. Neuropsych: This patient is capable of making decisions on his own behalf. 6. Skin/Wound Care: Routine pressure relief measures 7. Fluids/Electrolytes/Nutrition: Monitor I/Os. Encourage fluid intake.  8. HTN: Monitor BP tid--continue HCTZ, Norvasc, and metoprolol              Vitals:   09/22/20 1950 09/23/20 0439  BP: 127/78 (!) 151/84  Pulse: 69 76  Resp: 17 18  Temp: 98.5 F (36.9 C) 98.1 F (36.7 C)  SpO2: 97% 99%  controlled 4/15 9. CKD III: Will monitor renal status with serial checks             --avoid hypoperfusion             --BUN/SCr 21/1.31 at admission-->34/1.42. now 57 and 2.17, IVF at noc             4/11: 1.53 on 4/11,repeat BMET  improving , hold IVF and monitor  10. Dyslipidemia: Lipitor 11. Impaired fasting glucose:  Hgb A1c-4.7- normal             -- Elevated fasting BS likely due to stress.will not need CBG monitoring long term  Moderate increased mobility 12. Tobacco abuse             Counsel 13. Hypokalemia: K+ 3.2, supplement with BID today , repeat 3.4 will give daily dose of given pt on HCTZ- recheck in am     LOS: 9 days A FACE TO FACE EVALUATION WAS PERFORMED  Erick Colace 09/23/2020, 9:02 AM

## 2020-09-23 NOTE — Progress Notes (Addendum)
Physical Therapy Session Note  Patient Details  Name: Darryl Sullivan MRN: [id number] Date of Birth: 1966-01-16  Today's Date: 09/23/2020 PT Individual Time: 0805-0900 PT Individual Time Calculation (min): 55 min   Short Term Goals: Week 2:  PT Short Term Goal 1 (Week 2): Pt will ambulate 140f wth min assist and LRAD PT Short Term Goal 2 (Week 2): [facility] PT Short Term Goal 3 (Week 2): Pt will propell WC >1517fwith supervision assist  Skilled Therapeutic Interventions/Progress Updates:   Pt received supine in bed and agreeable to PT. Supine>sit transfer with supervision  assist and cues for rolling to the R for improved activation of trunk   Clothing management following incontinent bowel and bladder. Sit<>stand performed with min assist x 4 from EOB. PT donned shoes ffor pt while sitting EOB with total A.   [facility] and min assist from PT. Pt transported to rehab gym. pregait stepping over hockey stick on the floor x 8 BLE with min-mod assist to improve coordination of the step on the LLE.   trunkal rotation seated in WC x 6 bil with 3 sec hold at end range and controlled eccentric motion.   Gait training with RW 2 x 3039fith min assist from PT with facilitation for weight shift L and improved pelvic rotation.  Dynamic gait training to performed forward/reverse gait x [id number] direction. Cues for improved gluteal activation and increased step length on the RLE to allow improve activation of the LLE in stance.    WC mobility with hemi technique x [id number] min assist to prevent veer to the L. Pt reports mild fatigue in the RUE limiting increased distance.  Pt returned to room and performed stand pivot transfer to bed with RW and min assist. Sit>supine completed with min assist for LLE management and left supine in bed with call bell in reach and all needs met.       Therapy Documentation Precautions:   Precautions Precautions: Fall Precaution Comments: L hemi UE> LE, L AFO, mild dysarthria Restrictions Weight Bearing Restrictions: No Pain: denies  Therapy/Group: Individual Therapy  AustiLorie Phenix/2022, 10:01 AM

## 2020-09-23 NOTE — Progress Notes (Signed)
Occupational Therapy Weekly Progress Note  Patient Details  Name: Darryl Sullivan MRN: [id number] Date of Birth: [birth year]  Beginning of progress report period: September 15, 2020 End of progress report period: September 23, 2020  Today's Date: 09/23/2020 OT Individual Time: 1004-1100 OT Individual Time Calculation (min): 56 min    Patient has met 4 of 4 short term goals.  Pt has made significant progress this reporting period with OT. He demonstrates improved carryover of compensatory strategies, dynamic standing balance, and activity tolerance to overall be at min A level for ADL/functional mobility. Brunstrum Level LUE: II to III, hand: I to II.  Patient continues to demonstrate the following deficits: muscle weakness, decreased cardiorespiratoy endurance, impaired timing and sequencing, abnormal tone, unbalanced muscle activation, motor apraxia, ataxia and decreased coordination and decreased standing balance, decreased postural control, hemiplegia and decreased balance strategies and therefore will continue to benefit from skilled OT intervention to enhance overall performance with BADL and Reduce care partner burden.  Patient progressing toward long term goals..  Continue plan of care.  OT Short Term Goals Week 2:  OT Short Term Goal 1 (Week 2): Pt will demonstrate improved LUE management during functional transfers with min VCs. OT Short Term Goal 2 (Week 2): Pt will complete STS in prep for standing ADL with close S + AE PRN. OT Short Term Goal 3 (Week 2): Pt will don/doff B shoes with min A.  Skilled Therapeutic Interventions/Progress Updates:    Pt received semi-reclined in bed, agreeable to therapy. C/o L shoulder pain, did not quantify. Came to sitting EOB with close S and use of bed rail. Stand-pivot to w/c with min A to block L knee. Donned/doffed B shoes + AFO with max A. Completed oral and hair care seated at sink with close S. Stand-pivot to urinate standing at toilet with use of R  grab bar and min A for balance. Light min A for clothing management , cont void of bladder. Doffed pants CGA for STS, donned new pants min A to thread LLE. Stood at high low table and completed 1x15 of modified push ups, forward shoulder slides, and shoulder flexion to reach target to target LUE NMR. Stand-pivot back to bed same manner as above. Return to supine close S.   Pt left semi-reclined in bed with bed alarm engaged, call bell in reach, and all immediate needs met.    Therapy Documentation Precautions:  Precautions Precautions: Fall Precaution Comments: L hemi UE> LE, L AFO, mild dysarthria Restrictions Weight Bearing Restrictions: No Pain:   see session note ADL: See Care Tool for more details.   Therapy/Group: Individual Therapy  Volanda Napoleon MS, OTR/L  09/23/2020, 12:39 PM

## 2020-09-24 NOTE — Progress Notes (Signed)
PROGRESS NOTE   Subjective/Complaints:  No complaints this morning Sleepy but easily arousable Vital signs stable Participated well with PT  ROS- neg CP SOB, denies pain, constipation  Objective:   No results found. No results for input(s): WBC, HGB, HCT, PLT in the last 72 hours. Recent Labs    09/23/20 0930  NA 137  K 3.7  CL 100  CO2 30  GLUCOSE 107*  BUN 25*  CREATININE 1.57*  CALCIUM 9.8    Intake/Output Summary (Last 24 hours) at 09/24/2020 1512 Last data filed at 09/24/2020 1341 Gross per 24 hour  Intake 480 ml  Output 1050 ml  Net -570 ml        Physical Exam: Vital Signs Blood pressure 123/70, pulse 70, temperature 99.9 F (37.7 C), temperature source Oral, resp. rate 15, height 5\' 7"  (1.702 m), weight 100.3 kg, SpO2 100 %. Gen: no distress, normal appearing HEENT: oral mucosa pink and moist, NCAT Cardio: Reg rate Chest: normal effort, normal rate of breathing Abd: soft, non-distended Ext: no edema Psych: pleasant, normal affect Skin: intact  Neurologic:  motor strength is 5/5 in right , 2- Left  deltoid, bicep, tricep, 5/5 RIght and 2- left grip, 5/5 R and 3- Left hip flexor, knee extensors, 0 ankle dorsiflexor and trace plantar flexor Sensory exam normal sensation to light touch and proprioception in bilateral upper and lower extremities  Musculoskeletal: Full range of motion in all 4 extremities. No joint swelling, no pain with AROM left shoulder    Assessment/Plan: 1. Functional deficits which require 3+ hours per day of interdisciplinary therapy in a comprehensive inpatient rehab setting.  Physiatrist is providing close team supervision and 24 hour management of active medical problems listed below.  Physiatrist and rehab team continue to assess barriers to discharge/monitor patient progress toward functional and medical goals  Care Tool:  Bathing    Body parts bathed by patient:  Left upper leg,Right upper leg,Front perineal area,Abdomen,Chest,Left arm,Face,Buttocks   Body parts bathed by helper: Right arm,Buttocks     Bathing assist Assist Level: Minimal Assistance - Patient > 75%     Upper Body Dressing/Undressing Upper body dressing   What is the patient wearing?: Pull over shirt    Upper body assist Assist Level: Supervision/Verbal cueing    Lower Body Dressing/Undressing Lower body dressing      What is the patient wearing?: Pants     Lower body assist Assist for lower body dressing: Minimal Assistance - Patient > 75%     Toileting Toileting    Toileting assist Assist for toileting: Minimal Assistance - Patient > 75%     Transfers Chair/bed transfer  Transfers assist     Chair/bed transfer assist level: Minimal Assistance - Patient > 75%     Locomotion Ambulation   Ambulation assist      Assist level: Moderate Assistance - Patient 50 - 74% Assistive device: Walker-rolling Max distance: 55 ft   Walk 10 feet activity   Assist     Assist level: Moderate Assistance - Patient - 50 - 74% Assistive device: Walker-rolling   Walk 50 feet activity   Assist Walk 50 feet with 2 turns activity did  not occur: Safety/medical concerns  Assist level: Moderate Assistance - Patient - 50 - 74% Assistive device: Walker-rolling    Walk 150 feet activity   Assist Walk 150 feet activity did not occur: Safety/medical concerns         Walk 10 feet on uneven surface  activity   Assist Walk 10 feet on uneven surfaces activity did not occur: Safety/medical concerns   Assist level: Moderate Assistance - Patient - 50 - 74% Assistive device: Photographer Will patient use wheelchair at discharge?: Yes Type of Wheelchair: Manual Wheelchair activity did not occur: Safety/medical concerns  Wheelchair assist level: Contact Guard/Touching assist,Minimal Assistance - Patient > 75%      Wheelchair 50  feet with 2 turns activity    Assist    Wheelchair 50 feet with 2 turns activity did not occur: Safety/medical concerns   Assist Level: Minimal Assistance - Patient > 75%,Contact Guard/Touching assist (min A with fatigue)   Wheelchair 150 feet activity     Assist  Wheelchair 150 feet activity did not occur: Safety/medical concerns       Blood pressure 123/70, pulse 70, temperature 99.9 F (37.7 C), temperature source Oral, resp. rate 15, height 5\' 7"  (1.702 m), weight 100.3 kg, SpO2 100 %.  Medical Problem List and Plan: 1.  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  Impaired mobility -DVT/anticoagulation:  Pharmaceutical: Continue Lovenox             -antiplatelet therapy: Low dose ASA.  3. Pain Management: Continue Voltaren gel for left shoulder subluxation.              Moderate increased exertion 4. Mood: LCSW to follow for evaluation and support.              -antipsychotic agents: N/A 5. Neuropsych: This patient is capable of making decisions on his own behalf. 6. Skin/Wound Care: Routine pressure relief measures 7. Fluids/Electrolytes/Nutrition: Monitor I/Os. Encourage fluid intake.  8. HTN: Monitor BP tid--continue HCTZ, Norvasc, and metoprolol              Vitals:   09/24/20 0732 09/24/20 1337  BP: 134/81 123/70  Pulse: 72 70  Resp:  15  Temp:  99.9 F (37.7 C)  SpO2:  100%  controlled 4/16 9. CKD III: Will monitor renal status with serial checks             --avoid hypoperfusion             --BUN/SCr 21/1.31 at admission-->34/1.42. now 57 and 2.17, IVF at noc             4/11: 1.53 on 4/11,repeat BMET improving , hold IVF and monitor  10. Dyslipidemia: Lipitor 11. Impaired fasting glucose:  Hgb A1c-4.7-  normal             -- Elevated fasting BS likely due to stress.will not need CBG monitoring long term              Moderate increased mobility 12. Tobacco abuse  Counsel 13. Hypokalemia: K+ 3.2, supplement with BID today , repeat 3.4 will give daily dose of given pt on HCTZ- recheck on Monday    LOS: 10 days A FACE TO FACE EVALUATION WAS PERFORMED  Kaye Mitro P Viney Acocella 09/24/2020, 3:12 PM

## 2020-09-24 NOTE — Progress Notes (Signed)
Occupational Therapy Session Note  Patient Details  Name: Darryl Sullivan MRN: [id number] Date of Birth: [birth year]  Today's Date: 09/24/2020 OT Individual Time: 1515-1600 OT Individual Time Calculation (min): 45 min    Short Term Goals: Week 2:  OT Short Term Goal 1 (Week 2): Pt will demonstrate improved LUE management during functional transfers with min VCs. OT Short Term Goal 2 (Week 2): Pt will complete STS in prep for standing ADL with close S + AE PRN. OT Short Term Goal 3 (Week 2): Pt will don/doff B shoes with min A. Week 3:     Skilled Therapeutic Interventions/Progress Updates:    1:1. Pt received in bed agreeable to OT, but missed 15 min at front end of session d.t scheduling conflict. No pain reported during session. Pt requesting to shower. Pt completes squat pivot transfer to L with MIN A overall. Stand pivot transfer w/c<>TTB/EOB with MIN A overall with guarding of LLE. Pt bathes sit to stand with A only wash BLE d/t height/small shower space. Pt stands with CGA and facilitation of WB LUE on grab bar. Pt dons shirt with CGA to pull shirt past L elbow and min A to thread LLE into pants/STS at sink for pt to advance past hips. Pt able to don B socks with MIN A to position LLE into figure 4. Exited session with pt seated in bed, exit alarm on and call light in reach    Therapy Documentation Precautions:  Precautions Precautions: Fall Precaution Comments: L hemi UE> LE, L AFO, mild dysarthria Restrictions Weight Bearing Restrictions: No General:   Vital Signs: Therapy Vitals Temp: 99.1 F (37.3 C) Temp Source: Oral Pulse Rate: 70 Resp: 18 BP: 121/79 Patient Position (if appropriate): Lying Oxygen Therapy SpO2: 98 % O2 Device: Room Air Pain:   ADL: ADL Eating: Set up Where Assessed-Eating: Wheelchair Grooming: Supervision/safety Where Assessed-Grooming: Sitting at sink,Wheelchair Upper Body Bathing: Minimal assistance Where Assessed-Upper Body Bathing:  Shower Lower Body Bathing: Moderate cueing Where Assessed-Lower Body Bathing: Shower Upper Body Dressing: Maximal assistance Where Assessed-Upper Body Dressing: Sitting at sink Lower Body Dressing: Maximal assistance Where Assessed-Lower Body Dressing: Sitting at sink Toileting: Moderate assistance Where Assessed-Toileting: Bedside Commode Toilet Transfer: Moderate assistance Toilet Transfer Method: Stand pivot Acupuncturist: Psychiatric nurse: Dependent (stedy) Astronomer: Manufacturing systems engineer    Praxis   Exercises:   Other Treatments:     Therapy/Group: Individual Therapy  Shon Hale 09/24/2020, 6:53 AM

## 2020-09-25 NOTE — Progress Notes (Signed)
PROGRESS NOTE   Subjective/Complaints:  No complaints this morning. Denies pain, constipation, insomnia.   ROS- neg CP SOB, denies pain, constipation  Objective:   No results found. No results for input(s): WBC, HGB, HCT, PLT in the last 72 hours. Recent Labs    09/23/20 0930  NA 137  K 3.7  CL 100  CO2 30  GLUCOSE 107*  BUN 25*  CREATININE 1.57*  CALCIUM 9.8    Intake/Output Summary (Last 24 hours) at 09/25/2020 1450 Last data filed at 09/25/2020 1443 Gross per 24 hour  Intake 960 ml  Output 800 ml  Net 160 ml        Physical Exam: Vital Signs Blood pressure 131/77, pulse 64, temperature 97.9 F (36.6 C), temperature source Oral, resp. rate 17, height 5\' 7"  (1.702 m), weight 100.3 kg, SpO2 100 %. Gen: no distress, normal appearing HEENT: oral mucosa pink and moist, NCAT Cardio: Reg rate Chest: normal effort, normal rate of breathing Abd: soft, non-distended Ext: no edema Psych: pleasant, normal affect Skin: intact  Neurologic:  motor strength is 5/5 in right , 2- Left  deltoid, bicep, tricep, 5/5 RIght and 2- left grip, 5/5 R and 3- Left hip flexor, knee extensors, 0 ankle dorsiflexor and trace plantar flexor Sensory exam normal sensation to light touch and proprioception in bilateral upper and lower extremities  Musculoskeletal: Full range of motion in all 4 extremities. No joint swelling, no pain with AROM left shoulder    Assessment/Plan: 1. Functional deficits which require 3+ hours per day of interdisciplinary therapy in a comprehensive inpatient rehab setting.  Physiatrist is providing close team supervision and 24 hour management of active medical problems listed below.  Physiatrist and rehab team continue to assess barriers to discharge/monitor patient progress toward functional and medical goals  Care Tool:  Bathing    Body parts bathed by patient: Left upper leg,Right upper leg,Front  perineal area,Abdomen,Chest,Left arm,Face,Buttocks   Body parts bathed by helper: Right arm,Buttocks     Bathing assist Assist Level: Minimal Assistance - Patient > 75%     Upper Body Dressing/Undressing Upper body dressing   What is the patient wearing?: Pull over shirt    Upper body assist Assist Level: Supervision/Verbal cueing    Lower Body Dressing/Undressing Lower body dressing      What is the patient wearing?: Pants     Lower body assist Assist for lower body dressing: Minimal Assistance - Patient > 75%     Toileting Toileting    Toileting assist Assist for toileting: Minimal Assistance - Patient > 75%     Transfers Chair/bed transfer  Transfers assist     Chair/bed transfer assist level: Minimal Assistance - Patient > 75%     Locomotion Ambulation   Ambulation assist      Assist level: Moderate Assistance - Patient 50 - 74% Assistive device: Walker-rolling Max distance: 55 ft   Walk 10 feet activity   Assist     Assist level: Moderate Assistance - Patient - 50 - 74% Assistive device: Walker-rolling   Walk 50 feet activity   Assist Walk 50 feet with 2 turns activity did not occur: Safety/medical concerns  Assist  level: Moderate Assistance - Patient - 50 - 74% Assistive device: Walker-rolling    Walk 150 feet activity   Assist Walk 150 feet activity did not occur: Safety/medical concerns         Walk 10 feet on uneven surface  activity   Assist Walk 10 feet on uneven surfaces activity did not occur: Safety/medical concerns   Assist level: Moderate Assistance - Patient - 50 - 74% Assistive device: Photographer Will patient use wheelchair at discharge?: Yes Type of Wheelchair: Manual Wheelchair activity did not occur: Safety/medical concerns  Wheelchair assist level: Contact Guard/Touching assist,Minimal Assistance - Patient > 75%      Wheelchair 50 feet with 2 turns  activity    Assist    Wheelchair 50 feet with 2 turns activity did not occur: Safety/medical concerns   Assist Level: Minimal Assistance - Patient > 75%,Contact Guard/Touching assist (min A with fatigue)   Wheelchair 150 feet activity     Assist  Wheelchair 150 feet activity did not occur: Safety/medical concerns       Blood pressure 131/77, pulse 64, temperature 97.9 F (36.6 C), temperature source Oral, resp. rate 17, height 5\' 7"  (1.702 m), weight 100.3 kg, SpO2 100 %.  Medical Problem List and Plan: 1.  Left hemiparesis, deficits with mobility, self-care secondary to right basal ganglia infarct with white matter changes with left basal ganglia punctate hemorrhage post TPA             -patient may shower             -ELOS/Goals: 4/29/supervision/min a             [facility] PT, OT  2.  Impaired mobility -DVT/anticoagulation:  Pharmaceutical: Continue Lovenox             -antiplatelet therapy: Low dose ASA.  3. Pain Management: Continue Voltaren gel for left shoulder subluxation.              Moderate increased exertion 4. Mood: LCSW to follow for evaluation and support.              -antipsychotic agents: N/A 5. Neuropsych: This patient is capable of making decisions on his own behalf. 6. Skin/Wound Care: Routine pressure relief measures 7. Fluids/Electrolytes/Nutrition: Monitor I/Os. Encourage fluid intake.  8. HTN: Monitor BP tid--continue HCTZ, Norvasc, and metoprolol              Vitals:   09/25/20 0603 09/25/20 1247  BP: 140/80 131/77  Pulse: 62 64  Resp: 16 17  Temp: 98.4 F (36.9 C) 97.9 F (36.6 C)  SpO2: 99% 100%  controlled 4/17 9. CKD III: Will monitor renal status with serial checks             --avoid hypoperfusion             --BUN/SCr 21/1.31 at admission-->34/1.42. now 57 and 2.17, IVF at noc             4/11: 1.53 on 4/11,repeat BMET improving , hold IVF and monitor  10. Dyslipidemia: Lipitor 11. Impaired fasting glucose:  Hgb A1c-4.7-  normal             -- Elevated fasting BS likely due to stress.will not need CBG monitoring long term              Moderate increased mobility 12. Tobacco abuse  Counsel 13. Hypokalemia: K+ 3.2, supplement with BID today , repeat 3.4 will give daily dose of given pt on HCTZ- recheck on Monday    LOS: 11 days A FACE TO FACE EVALUATION WAS PERFORMED  Serrina Minogue P Ayde Record 09/25/2020, 2:50 PM

## 2020-09-26 LAB — BASIC METABOLIC PANEL
Anion gap: 10 (ref 5–15)
BUN: 22 mg/dL — ABNORMAL HIGH (ref 6–20)
CO2: 29 mmol/L (ref 22–32)
Calcium: 10 mg/dL (ref 8.9–10.3)
Chloride: 100 mmol/L (ref 98–111)
Creatinine, Ser: 1.51 mg/dL — ABNORMAL HIGH (ref 0.61–1.24)
GFR, Estimated: 54 mL/min — ABNORMAL LOW (ref >60)
Glucose, Bld: 102 mg/dL — ABNORMAL HIGH (ref 70–99)
Potassium: 3.7 mmol/L (ref 3.5–5.1)
Sodium: 139 mmol/L (ref 135–145)

## 2020-09-26 LAB — CBC
HCT: 43.7 % (ref 39.0–52.0)
Hemoglobin: 13.7 g/dL (ref 13.0–17.0)
MCH: 28.1 pg (ref 26.0–34.0)
MCHC: 31.4 g/dL (ref 30.0–36.0)
MCV: 89.5 fL (ref 80.0–100.0)
Platelets: 261 10*3/uL (ref 150–400)
RBC: 4.88 MIL/uL (ref 4.22–5.81)
RDW: 11 % — ABNORMAL LOW (ref 11.5–15.5)
WBC: 5.7 10*3/uL (ref 4.0–10.5)
nRBC: 0 % (ref 0.0–0.2)

## 2020-09-26 NOTE — Progress Notes (Signed)
PROGRESS NOTE   Subjective/Complaints:  Pt would like SW to call his ex wife Martie Lee No c/os today , discussed labwork with pt   ROS- neg CP SOB, denies pain, constipation  Objective:   No results found. Recent Labs    09/26/20 0549  WBC 5.7  HGB 13.7  HCT 43.7  PLT 261   Recent Labs    09/23/20 0930 09/26/20 0549  NA 137 139  K 3.7 3.7  CL 100 100  CO2 30 29  GLUCOSE 107* 102*  BUN 25* 22*  CREATININE 1.57* 1.51*  CALCIUM 9.8 10.0    Intake/Output Summary (Last 24 hours) at 09/26/2020 0855 Last data filed at 09/26/2020 0700 Gross per 24 hour  Intake 920 ml  Output 1275 ml  Net -355 ml        Physical Exam: Vital Signs Blood pressure 132/80, pulse 73, temperature 99.8 F (37.7 C), temperature source Oral, resp. rate 16, height 5\' 7"  (1.702 m), weight 100.3 kg, SpO2 100 %. Gen: no distress, normal appearing HEENT: oral mucosa pink and moist, NCAT Cardio: Reg rate Chest: normal effort, normal rate of breathing Abd: soft, non-distended Ext: no edema Psych: pleasant, normal affect Skin: intact  Neurologic:  motor strength is 5/5 in right , 2- Left  deltoid, bicep, tricep, 5/5 RIght and 2- left grip, 5/5 R and 3- Left hip flexor, knee extensors, 0 ankle dorsiflexor and trace plantar flexor Sensory exam normal sensation to light touch and proprioception in bilateral upper and lower extremities  Musculoskeletal: Full range of motion in all 4 extremities. No joint swelling, no pain with AROM left shoulder    Assessment/Plan: 1. Functional deficits which require 3+ hours per day of interdisciplinary therapy in a comprehensive inpatient rehab setting.  Physiatrist is providing close team supervision and 24 hour management of active medical problems listed below.  Physiatrist and rehab team continue to assess barriers to discharge/monitor patient progress toward functional and medical goals  Care  Tool:  Bathing    Body parts bathed by patient: Left upper leg,Right upper leg,Front perineal area,Abdomen,Chest,Left arm,Face,Buttocks   Body parts bathed by helper: Right arm,Buttocks     Bathing assist Assist Level: Minimal Assistance - Patient > 75%     Upper Body Dressing/Undressing Upper body dressing   What is the patient wearing?: Pull over shirt    Upper body assist Assist Level: Supervision/Verbal cueing    Lower Body Dressing/Undressing Lower body dressing      What is the patient wearing?: Pants     Lower body assist Assist for lower body dressing: Minimal Assistance - Patient > 75%     Toileting Toileting    Toileting assist Assist for toileting: Minimal Assistance - Patient > 75%     Transfers Chair/bed transfer  Transfers assist     Chair/bed transfer assist level: Minimal Assistance - Patient > 75%     Locomotion Ambulation   Ambulation assist      Assist level: Moderate Assistance - Patient 50 - 74% Assistive device: Walker-rolling Max distance: 55 ft   Walk 10 feet activity   Assist     Assist level: Moderate Assistance - Patient - 43 -  74% Assistive device: Walker-rolling   Walk 50 feet activity   Assist Walk 50 feet with 2 turns activity did not occur: Safety/medical concerns  Assist level: Moderate Assistance - Patient - 50 - 74% Assistive device: Walker-rolling    Walk 150 feet activity   Assist Walk 150 feet activity did not occur: Safety/medical concerns         Walk 10 feet on uneven surface  activity   Assist Walk 10 feet on uneven surfaces activity did not occur: Safety/medical concerns   Assist level: Moderate Assistance - Patient - 50 - 74% Assistive device: Photographer Will patient use wheelchair at discharge?: Yes Type of Wheelchair: Manual Wheelchair activity did not occur: Safety/medical concerns  Wheelchair assist level: Contact Guard/Touching  assist,Minimal Assistance - Patient > 75%      Wheelchair 50 feet with 2 turns activity    Assist    Wheelchair 50 feet with 2 turns activity did not occur: Safety/medical concerns   Assist Level: Minimal Assistance - Patient > 75%,Contact Guard/Touching assist (min A with fatigue)   Wheelchair 150 feet activity     Assist  Wheelchair 150 feet activity did not occur: Safety/medical concerns       Blood pressure 132/80, pulse 73, temperature 99.8 F (37.7 C), temperature source Oral, resp. rate 16, height 5\' 7"  (1.702 m), weight 100.3 kg, SpO2 100 %.  Medical Problem List and Plan: 1.  Left hemiparesis, deficits with mobility, self-care secondary to right basal ganglia infarct with white matter changes with left basal ganglia punctate hemorrhage post TPA             -patient may shower             -ELOS/Goals: 4/29/supervision/min a             [facility] PT, OT  2.  Impaired mobility -DVT/anticoagulation:  Pharmaceutical: Continue Lovenox             -antiplatelet therapy: Low dose ASA.  3. Pain Management: Continue Voltaren gel for left shoulder subluxation.              Moderate increased exertion 4. Mood: LCSW to follow for evaluation and support.              -antipsychotic agents: N/A 5. Neuropsych: This patient is capable of making decisions on his own behalf. 6. Skin/Wound Care: Routine pressure relief measures 7. Fluids/Electrolytes/Nutrition: Monitor I/Os. Encourage fluid intake.  8. HTN: Monitor BP tid--continue HCTZ, Norvasc, and metoprolol              Vitals:   09/26/20 0424 09/26/20 0704  BP: 120/80 132/80  Pulse: 67 73  Resp: 16   Temp: 99.8 F (37.7 C)   SpO2: 100%   controlled 4/17 9. CKD III: Will monitor renal status with serial checks             --avoid hypoperfusion             --BUN/SCr 21/1.31 at admission-->34/1.42. now 57 and 2.17, IVF at noc             4/11: 1.53 on 4/11,at baseline off IVF 1.51 on 4/18 10. Dyslipidemia:  Lipitor 11. Impaired fasting glucose:  Hgb A1c-4.7- normal             -- Elevated fasting BS likely due to stress.will not need CBG monitoring long term  Moderate increased mobility 12. Tobacco abuse             Counsel 13. Hypokalemia: K+ 3.2, supplement with BID today , repeat 3.4 will give daily dose of given pt on HCTZ- recheck 3.7 on 4/18 cont curren tdose of KCL     LOS: 12 days A FACE TO FACE EVALUATION WAS PERFORMED  Erick Colace 09/26/2020, 8:55 AM

## 2020-09-26 NOTE — Progress Notes (Signed)
Physical Therapy Session Note  Patient Details  Name: Darryl Sullivan MRN: [id number] Date of Birth: 03-17-1966  Today's Date: 09/26/2020 PT Individual Time:  0801-0900 and [phone]  PT Individual Time Calculation (min): 59 min and 48 min  Short Term Goals: Week 2:  PT Short Term Goal 1 (Week 2): Pt will ambulate 162ft wth min assist and LRAD PT Short Term Goal 2 (Week 2): Pt will transfer to and from [facility] with CGA and LRAd PT Short Term Goal 3 (Week 2): Pt will propell WC >176ft with supervision assist  Skilled Therapeutic Interventions/Progress Updates:  Session1: Patient supine in bed upon PT arrival. Patient initially asleep but easily roused and agreeable to PT session. No pain complaint throughout session.  Therapeutic Activity: Bed Mobility: Patient performed supine <> sit with supervision requiring extra time to reach seated position d/t early morning fatigue. Provided verbal cues for technique. Transfers: Patient performed STS, SPVT, and toilet transfers throughout session to/ from various surfaces and heights.  Performs with CGA to RW with minimal cueing required for technique. Uses safety rail in bathroom for toilet transfer with CGA/ supervision. Requires extra time for bowel movement. Requires Max A for pericare and Mod A for donning clothes. Is able to maintain standing balance with no UE support and pull pants up on R side.   Gait Training:  Patient ambulated in-room distances using RW with CGA/ intermittent Min A for LLE advancement/ knee extension. Provided verbal cues for technique and upright posture.  Wheelchair Mobility:  Patient propelled wheelchair within room for ~10 feet at a time with Min A and requires continued education re: RUE used to propel and RLE used to steer. Provided verbal cues for technique throughout.  Sitting balance challenged in unsupported sitting with bil feet supported on floor. Pt guided in resisting perturbation movements and challenged with  reaching activity at sink. Good control and balance throughout.   Patient seated upright in w/c at end of session with brakes locked, belt alarm set, and all needs within reach on tray table placed in front of pt.   Session 2: Patient seated upright in w/c upon entrance to room. Patient alert and agreeable to PT session. Denied pain during session.  Therapeutic Activity: Bed Mobility: At end of session, pt performed sit--> supine with supervision and RLE assisting LLE to bed surface. Provided verbal cues for effort and technique. Transfers: Patient performed STS, SPVT transfers throughout session with CGA/ intermittent light Min A for power up. Provided verbal cues for forward lean and increasing effort during power up. Vc also provided for sequencing of pivot stepping.   Gait Training:  Patient ambulated 100' x1/ 56' x1/ 81' x1  using RW with CGA/ Min A for intermittent LLE advancement and maintaining extension with weight bearing.  Provided vc/ tc throughout for technique, upright posture, increasing L hip forward propulsion during swing through, and increasing R step length.   Pt guided in step negotiation training and able to ascend/ descend four 6" steps using BHR with hand over hand grasp to L hand. Pt ascends leading with RLE and is able to clear L foot to each step. Ascends with CGA/ Min A and vc throughout. Anxiety demonstrated with descent and requires Min A to descend leading with LLE. Block provided to L knee to improve confidence.   Patient supine in bed at end of session with brakes locked, bed alarm set, and all needs within reach.    Therapy Documentation Precautions:  Precautions Precautions: Fall Precaution  Comments: L hemi UE> LE, L AFO, mild dysarthria Restrictions Weight Bearing Restrictions: No   Therapy/Group: Individual Therapy  Loel Dubonnet PT, DPT 09/26/2020, 8:01 AM

## 2020-09-26 NOTE — Progress Notes (Signed)
Occupational Therapy Session Note  Patient Details  Name: Darryl Sullivan MRN: [id number] Date of Birth: Jul 19, 1965  Today's Date: 09/26/2020 OT Individual Time: 1103-1200 OT Individual Time Calculation (min): 57 min   Skilled Therapeutic Interventions/Progress Updates:    Pt greeted in the w/c with no c/o pain. ADL needs met. Therefore worked on [place] by engaging in active and active assist ROM exercises in beat to music. Taught pt self ROM for affected UE and LE. He used the UE ranger during 1st portion of session, tactile and verbal cues for obtaining full extension of elbow. Pts affect bright and cheerful throughout, focus placed on repetition of ROM movements to promote functional gains. At end of session pt remained in the w/c, all needs within reach and safety belt fastened.   Therapy Documentation Precautions:  Precautions Precautions: Fall Precaution Comments: L hemi UE> LE, L AFO, mild dysarthria Restrictions Weight Bearing Restrictions: No ADL: ADL Eating: Set up Where Assessed-Eating: Wheelchair Grooming: Supervision/safety Where Assessed-Grooming: Sitting at sink,Wheelchair Upper Body Bathing: Minimal assistance Where Assessed-Upper Body Bathing: Shower Lower Body Bathing: Moderate cueing Where Assessed-Lower Body Bathing: Shower Upper Body Dressing: Maximal assistance Where Assessed-Upper Body Dressing: Sitting at sink Lower Body Dressing: Maximal assistance Where Assessed-Lower Body Dressing: Sitting at sink Toileting: Moderate assistance Where Assessed-Toileting: Bedside Commode Toilet Transfer: Moderate assistance Toilet Transfer Method: Stand pivot Science writer: Geophysical data processor: Dependent (stedy) Youth worker: Radio broadcast assistant      Therapy/Group: Individual Therapy  Darryl Sullivan 09/26/2020, 12:50 PM

## 2020-09-26 NOTE — Progress Notes (Signed)
Physical Therapy Session Note  Patient Details  Name: Darryl Sullivan MRN: [id number] Date of Birth: [birth year]  Today's Date: 09/26/2020 PT Individual Time: 1530-1600 PT Individual Time Calculation (min): 30 min   Short Term Goals: Week 2:  PT Short Term Goal 1 (Week 2): Pt will ambulate 178ft wth min assist and LRAD PT Short Term Goal 2 (Week 2): Pt will transfer to and from [facility] with CGA and LRAd PT Short Term Goal 3 (Week 2): Pt will propell WC >140ft with supervision assist  Skilled Therapeutic Interventions/Progress Updates:    Patient in supine without complaint.  Noted still wearing shoes and AFO in bed.  Patient supine to sit with CGA.  Sit to stand and pivot to w/c with min A.  Patient assisted in w/c to therapy gym for time constraints.  Performed sit to stand to RW with min A.  Ambulated x 45' min to mod A with facilitation for L hip protraction, L hip extension in stance and cues for tight knee at times in stance.  Patient seated in chair to check skin under AFO.  Educated not to wear in the bed for risk of skin breakdown.  Reports painful under shin pad and noted pt with bony shin so will contact orthotist for padding under shin plate.  Patient standing in L stance for R taps 2 x 10 to 6" step with R UE support and cues for tall on L LE.  Steps ups onto 6" step with L foot min A and cues/S for L foot placement.  Patient ambulated back to w/c x 45' with min to mod A.  Patient assisted in w/c to room and stand pivot using bed rail to bed.  Sit to supine with min A and NT aware pt needs assist to change brief.  Left with bed alarm active and needs/call bell in reach.  Therapy Documentation Precautions:  Precautions Precautions: Fall Precaution Comments: L hemi UE> LE, L AFO, mild dysarthria Restrictions Weight Bearing Restrictions: No Pain: Pain Assessment Pain Score: 0-No pain   Therapy/Group: Individual Therapy  Elray Mcgregor  [place], [place] 09/26/2020, 5:27 PM

## 2020-09-27 NOTE — Progress Notes (Signed)
Patient ID: Darryl Sullivan, male   DOB: [birth year], 55 y.o.   MRN: [id number]   Family education scheduled with pt daughter on 4/24 9-12. SW made attempt to scheduled with nephew, no answer- left VM  Lavera Guise, [place] [phone]

## 2020-09-27 NOTE — Evaluation (Signed)
Speech Language Pathology Assessment and Plan  Patient Details  Name: Darryl Sullivan MRN: [id number] Date of Birth: [birth year]   Today's Date: 09/27/2020 SLP Individual Time: 5852-7782 SLP Individual Time Calculation (min): 50 min   Hospital Problem: Principal Problem:   Stroke of right basal ganglia [facility])  Past Medical History:  Past Medical History:  Diagnosis Date  . Hypertension    Past Surgical History: History reviewed. No pertinent surgical history.  Assessment / Plan / Recommendation Clinical Impression   Darryl Sullivan is a 55 year old male in relatively good health who was admitted on 08/28/2020 to [facility] with left hemiparesis and elevated blood pressure.  UDS negative.  History taken from chart review and patient due to slowed cognition. CT head unremarkable for acute intracranial process and patient received TPA.  CTA head/neck was negative for LVO or stenosis. MRI brain on 09/05/2020 discussed and reviewed with Neurology, showing white matter and basal ganglia infarcts on right. Echocardiogram with ejection fraction of 60-65% with moderate LVH.  Follow up CT personally reviewed, showing small left basal ganglia hemorrhage, likely hemorrhagic conversion. Neurology recommended low dose ASA for stroke due to small vessel disease and outpatient sleep study to rule out OSA. Hospital course further complicated by AKI and renal ultrasound performed, showing increased cortical echogenicity c/w medical renal disease.  Meds titrated for BP control and metoprolol added tachycardia with activity. He continues to be limited by LUE/LLE weakness affecting ADLs as well as mobility. CIR recommended due to functional decline.   Patients overall verbal expression and receptive language appeared [facility]. He presented with slightly reduced intelligiblity but reported speech is at baseline. Cognistat completed to assess cognitive linguistic skills and patient scored in the average range. Subtests including  memory, orientation, attention, naming, reasoning and calculations. Pt demonstrated mild difficulty with 1/4 designs on design construction subtest but eventually completed accurately with a visual cue. No further SLP treatment recommended at this time. Education completed on speech intelligibility strategies.     Skilled Therapeutic Interventions          SLP evaluation completed. Pt in agreement that no further SLP services required at this time. Educated on speech intelligiblity strategies but pt reports no change in  vocal intensity and intelligibility since hospitalization.    SLP Assessment  Patient does not need any further [facility] [place] [facility] Services    Recommendations  Patient destination: Home Follow up Recommendations: Home Health SLP;Outpatient SLP Equipment Recommended: None recommended by SLP     Pain Pain Assessment Pain Scale: Faces Pain Score: 0-No pain Faces Pain Scale: No hurt  Prior Functioning Cognitive/Linguistic Baseline: Within functional limits Type of Home: House  Lives With: Family Available Help at Discharge: Family;Available 24 hours/day Vocation: Full time employment  SLP Evaluation Cognition Overall Cognitive Status: Within Functional Limits for tasks assessed Arousal/Alertness: Awake/alert Orientation Level: Oriented X4  Comprehension Auditory Comprehension Overall Auditory Comprehension: Appears within functional limits for tasks assessed Expression Expression Primary Mode of Expression: Verbal Verbal Expression Overall Verbal Expression: Appears within functional limits for tasks assessed Oral Motor Oral Motor/Sensory Function Overall Oral Motor/Sensory Function: Within functional limits  Care Tool Care Tool Cognition Expression of Ideas and Wants Expression of Ideas and Wants: Without difficulty (complex and basic) - expresses complex messages without difficulty and with speech that is clear and easy to understand    Understanding Verbal and Non-Verbal Content Understanding Verbal and Non-Verbal Content: Understands (complex and basic) - clear comprehension without cues or repetitions   Memory/Recall  Ability *first 3 days only         Recommendations for other services: None   Discharge Criteria: Patient will be discharged from SLP if patient refuses treatment 3 consecutive times without medical reason, if treatment goals not met, if there is a change in medical status, if patient makes no progress towards goals or if patient is discharged from hospital.  The above assessment, treatment plan, treatment alternatives and goals were discussed and mutually agreed upon: by patient  Gregary Signs A Jaecion Dempster 09/27/2020, 10:24 AM

## 2020-09-27 NOTE — Progress Notes (Signed)
Occupational Therapy Session Note  Patient Details  Name: Darryl Sullivan MRN: [id number] Date of Birth: January 31, 1966  Today's Date: 09/27/2020 OT Individual Time: 0710-0803 OT Individual Time Calculation (min): 53 min    Short Term Goals: Week 2:  OT Short Term Goal 1 (Week 2): Pt will demonstrate improved LUE management during functional transfers with min VCs. OT Short Term Goal 2 (Week 2): Pt will complete STS in prep for standing ADL with close S + AE PRN. OT Short Term Goal 3 (Week 2): Pt will don/doff B shoes with min A.  Skilled Therapeutic Interventions/Progress Updates:    Pt received semi-reclined in bed finishing breakfast, agreeable to therapy. Denies pain, agreeable to shower. Came to sitting EOB with close S and use of bed features. Stand-pivot with bed rail > w/c with min A to block L knee. Transfers to TTB in same manner with use of front grab bar. Bathed full-body with min A to bathe RUE, CGA for STS to bathe buttocks. Close S to doff/don shirt, min A to pull up new brief in back, CGA to don pants. Stand-pivot back to bed same manner as above and returned to supine close S with use of bed rail. Removed and reapplied K-tape to L shoulder to help approximate inferior subluxation. No skin breakdown/redness noted. Discussed available assist at home, states he is going home to his trailer, nephew will be present 24/7.   Pt left semi-reclined in bed with bed alarm engaged, call bell in reach, and all immediate needs met.    Therapy Documentation Precautions:  Precautions Precautions: Fall Precaution Comments: L hemi UE> LE, L AFO, mild dysarthria Restrictions Weight Bearing Restrictions: No Pain:  denies ADL: See Care Tool for more details.   Therapy/Group: Individual Therapy  Volanda Napoleon MS, OTR/L  09/27/2020, 6:39 AM

## 2020-09-27 NOTE — Progress Notes (Signed)
PROGRESS NOTE   Subjective/Complaints:  No issues, discussed SLP eval  ROS- neg CP SOB, denies pain, constipation  Objective:   No results found. Recent Labs    09/26/20 0549  WBC 5.7  HGB 13.7  HCT 43.7  PLT 261   Recent Labs    09/26/20 0549  NA 139  K 3.7  CL 100  CO2 29  GLUCOSE 102*  BUN 22*  CREATININE 1.51*  CALCIUM 10.0    Intake/Output Summary (Last 24 hours) at 09/27/2020 0847 Last data filed at 09/27/2020 0837 Gross per 24 hour  Intake 400 ml  Output 304 ml  Net 96 ml        Physical Exam: Vital Signs Blood pressure (!) 146/78, pulse 70, temperature 98.1 F (36.7 C), temperature source Oral, resp. rate 17, height 5\' 7"  (1.702 m), weight 100.3 kg, SpO2 99 %.  General: No acute distress Mood and affect are appropriate Heart: Regular rate and rhythm no rubs murmurs or extra sounds Lungs: Clear to auscultation, breathing unlabored, no rales or wheezes Abdomen: Positive bowel sounds, soft nontender to palpation, nondistended Extremities: No clubbing, cyanosis, or edema Skin: No evidence of breakdown, no evidence of rash Neurologic:  motor strength is 5/5 in right , 2- Left  deltoid, bicep, tricep, 5/5 RIght and 2- left grip, 5/5 R and 3- Left hip flexor, knee extensors, 0 ankle dorsiflexor and trace plantar flexor Sensory exam normal sensation to light touch and proprioception in bilateral upper and lower extremities  Musculoskeletal: Full range of motion in all 4 extremities. No joint swelling, no pain with AROM left shoulder    Assessment/Plan: 1. Functional deficits which require 3+ hours per day of interdisciplinary therapy in a comprehensive inpatient rehab setting.  Physiatrist is providing close team supervision and 24 hour management of active medical problems listed below.  Physiatrist and rehab team continue to assess barriers to discharge/monitor patient progress toward functional  and medical goals  Care Tool:  Bathing    Body parts bathed by patient: Left upper leg,Right upper leg,Front perineal area,Abdomen,Chest,Left arm,Face,Buttocks,Right lower leg,Left lower leg   Body parts bathed by helper: Right arm     Bathing assist Assist Level: Minimal Assistance - Patient > 75%     Upper Body Dressing/Undressing Upper body dressing   What is the patient wearing?: Pull over shirt    Upper body assist Assist Level: Supervision/Verbal cueing    Lower Body Dressing/Undressing Lower body dressing      What is the patient wearing?: Pants,Underwear/pull up     Lower body assist Assist for lower body dressing: Minimal Assistance - Patient > 75%     Toileting Toileting    Toileting assist Assist for toileting: Minimal Assistance - Patient > 75%     Transfers Chair/bed transfer  Transfers assist     Chair/bed transfer assist level: Minimal Assistance - Patient > 75%     Locomotion Ambulation   Ambulation assist      Assist level: Moderate Assistance - Patient 50 - 74% Assistive device: Walker-rolling Max distance: 55 ft   Walk 10 feet activity   Assist     Assist level: Moderate Assistance - Patient -  50 - 74% Assistive device: Walker-rolling   Walk 50 feet activity   Assist Walk 50 feet with 2 turns activity did not occur: Safety/medical concerns  Assist level: Moderate Assistance - Patient - 50 - 74% Assistive device: Walker-rolling    Walk 150 feet activity   Assist Walk 150 feet activity did not occur: Safety/medical concerns         Walk 10 feet on uneven surface  activity   Assist Walk 10 feet on uneven surfaces activity did not occur: Safety/medical concerns   Assist level: Moderate Assistance - Patient - 50 - 74% Assistive device: Photographer Will patient use wheelchair at discharge?: Yes Type of Wheelchair: Manual Wheelchair activity did not occur: Safety/medical  concerns  Wheelchair assist level: Contact Guard/Touching assist,Minimal Assistance - Patient > 75%      Wheelchair 50 feet with 2 turns activity    Assist    Wheelchair 50 feet with 2 turns activity did not occur: Safety/medical concerns   Assist Level: Minimal Assistance - Patient > 75%,Contact Guard/Touching assist (min A with fatigue)   Wheelchair 150 feet activity     Assist  Wheelchair 150 feet activity did not occur: Safety/medical concerns       Blood pressure (!) 146/78, pulse 70, temperature 98.1 F (36.7 C), temperature source Oral, resp. rate 17, height 5\' 7"  (1.702 m), weight 100.3 kg, SpO2 99 %.  Medical Problem List and Plan: 1.  Left hemiparesis, deficits with mobility, self-care secondary to right basal ganglia infarct with white matter changes with left basal ganglia punctate hemorrhage post TPA             -patient may shower             -ELOS/Goals: 4/29/supervision/min a             [facility] PT, OT- team conf in am   2.  Impaired mobility -DVT/anticoagulation:  Pharmaceutical: Continue Lovenox             -antiplatelet therapy: Low dose ASA.  3. Pain Management: Continue Voltaren gel for left shoulder subluxation.              Moderate increased exertion 4. Mood: LCSW to follow for evaluation and support.              -antipsychotic agents: N/A 5. Neuropsych: This patient is capable of making decisions on his own behalf. 6. Skin/Wound Care: Routine pressure relief measures 7. Fluids/Electrolytes/Nutrition: Monitor I/Os. Encourage fluid intake.  8. HTN: Monitor BP tid--continue HCTZ, Norvasc, and metoprolol              Vitals:   09/27/20 0433 09/27/20 0713  BP: 127/83 (!) 146/78  Pulse: 62 70  Resp: 17   Temp: 98.1 F (36.7 C)   SpO2: 99%   controlled 4/19 9. CKD III: Will monitor renal status with serial checks             --avoid hypoperfusion             --BUN/SCr 21/1.31 at admission-->34/1.42. now 57 and 2.17, IVF at noc              4/11: 1.53 on 4/11,at baseline off IVF 1.51 on 4/18 10. Dyslipidemia: Lipitor 11. Impaired fasting glucose:  Hgb A1c-4.7- normal             -- Elevated fasting BS likely due to stress.will not need CBG monitoring long term  Moderate increased mobility 12. Tobacco abuse             Counsel 13. Hypokalemia: K+ 3.2, supplement with BID today , repeat 3.4 will give daily dose of given pt on HCTZ- recheck 3.7 on 4/18 cont curren tdose of KCL     LOS: 13 days A FACE TO FACE EVALUATION WAS PERFORMED  Darryl Sullivan 09/27/2020, 8:47 AM

## 2020-09-28 MED ORDER — BETAMETHASONE SOD PHOS & ACET 6 (3-3) MG/ML IJ SUSP
12.0000 mg | Freq: Once | INTRAMUSCULAR | Status: DC
  Filled 2020-09-28: qty 2

## 2020-09-28 MED ORDER — LIDOCAINE HCL (PF) 1 % IJ SOLN: 5.0000 mL | Freq: Once | INTRAMUSCULAR | Status: DC

## 2020-09-28 MED ORDER — BETAMETHASONE SOD PHOS & ACET 6 (3-3) MG/ML IJ SUSP
6.0000 mg | Freq: Once | INTRAMUSCULAR | Status: AC
  Administered 2020-09-28: 6 mg via INTRA_ARTICULAR
  Filled 2020-09-28: qty 1

## 2020-09-28 MED ORDER — LIDOCAINE HCL 1 % IJ SOLN
5.0000 mL | Freq: Once | INTRAMUSCULAR | Status: AC
  Administered 2020-09-28: 5 mL via INTRADERMAL
  Filled 2020-09-28 (×2): qty 5

## 2020-09-28 NOTE — Progress Notes (Signed)
PROGRESS NOTE   Subjective/Complaints:  No issues, discussed SLP eval  ROS- neg CP SOB, denies pain, constipation  Objective:   No results found. Recent Labs    09/26/20 0549  WBC 5.7  HGB 13.7  HCT 43.7  PLT 261   Recent Labs    09/26/20 0549  NA 139  K 3.7  CL 100  CO2 29  GLUCOSE 102*  BUN 22*  CREATININE 1.51*  CALCIUM 10.0    Intake/Output Summary (Last 24 hours) at 09/28/2020 0847 Last data filed at 09/28/2020 0510 Gross per 24 hour  Intake 400 ml  Output 703 ml  Net -303 ml        Physical Exam: Vital Signs Blood pressure 134/71, pulse (!) 59, temperature 98.2 F (36.8 C), resp. rate 18, height 5' 7"  (1.702 m), weight 100.3 kg, SpO2 100 %.  General: No acute distress Mood and affect are appropriate Heart: Regular rate and rhythm no rubs murmurs or extra sounds Lungs: Clear to auscultation, breathing unlabored, no rales or wheezes Abdomen: Positive bowel sounds, soft nontender to palpation, nondistended Extremities: No clubbing, cyanosis, or edema Skin: No evidence of breakdown, no evidence of rash Neurologic:  motor strength is 5/5 in right , 2- Left  deltoid, bicep, tricep, 5/5 RIght and 2- left grip, 5/5 R and 3- Left hip flexor, knee extensors, 0 ankle dorsiflexor and trace plantar flexor Sensory exam normal sensation to light touch and proprioception in bilateral upper and lower extremities  Musculoskeletal: Full range of motion in all 4 extremities. No joint swelling, , + Hawkins-Kennedy  Assessment/Plan: 1. Functional deficits which require 3+ hours per day of interdisciplinary therapy in a comprehensive inpatient rehab setting.  Physiatrist is providing close team supervision and 24 hour management of active medical problems listed below.  Physiatrist and rehab team continue to assess barriers to discharge/monitor patient progress toward functional and medical goals  Care  Tool:  Bathing    Body parts bathed by patient: Left upper leg,Right upper leg,Front perineal area,Abdomen,Chest,Left arm,Face,Buttocks,Right lower leg,Left lower leg   Body parts bathed by helper: Right arm     Bathing assist Assist Level: Minimal Assistance - Patient > 75%     Upper Body Dressing/Undressing Upper body dressing   What is the patient wearing?: Pull over shirt    Upper body assist Assist Level: Supervision/Verbal cueing    Lower Body Dressing/Undressing Lower body dressing      What is the patient wearing?: Pants,Underwear/pull up     Lower body assist Assist for lower body dressing: Minimal Assistance - Patient > 75%     Toileting Toileting    Toileting assist Assist for toileting: Minimal Assistance - Patient > 75%     Transfers Chair/bed transfer  Transfers assist     Chair/bed transfer assist level: Minimal Assistance - Patient > 75%     Locomotion Ambulation   Ambulation assist      Assist level: Moderate Assistance - Patient 50 - 74% Assistive device: Walker-rolling Max distance: 55 ft   Walk 10 feet activity   Assist     Assist level: Moderate Assistance - Patient - 50 - 74% Assistive device: Walker-rolling  Walk 50 feet activity   Assist Walk 50 feet with 2 turns activity did not occur: Safety/medical concerns  Assist level: Moderate Assistance - Patient - 50 - 74% Assistive device: Walker-rolling    Walk 150 feet activity   Assist Walk 150 feet activity did not occur: Safety/medical concerns         Walk 10 feet on uneven surface  activity   Assist Walk 10 feet on uneven surfaces activity did not occur: Safety/medical concerns   Assist level: Moderate Assistance - Patient - 50 - 74% Assistive device: Aeronautical engineer Will patient use wheelchair at discharge?: Yes Type of Wheelchair: Manual Wheelchair activity did not occur: Safety/medical concerns  Wheelchair assist  level: Contact Guard/Touching assist,Minimal Assistance - Patient > 75%      Wheelchair 50 feet with 2 turns activity    Assist    Wheelchair 50 feet with 2 turns activity did not occur: Safety/medical concerns   Assist Level: Minimal Assistance - Patient > 75%,Contact Guard/Touching assist (min A with fatigue)   Wheelchair 150 feet activity     Assist  Wheelchair 150 feet activity did not occur: Safety/medical concerns       Blood pressure 134/71, pulse (!) 59, temperature 98.2 F (36.8 C), resp. rate 18, height 5' 7"  (1.702 m), weight 100.3 kg, SpO2 100 %.  Medical Problem List and Plan: 1.  Left hemiparesis, deficits with mobility, self-care secondary to right basal ganglia infarct with white matter changes with left basal ganglia punctate hemorrhage post TPA             -patient may shower             -ELOS/Goals: 4/29/supervision/min a             [facility] PT, OT- Team conference today please see physician documentation under team conference tab, met with team  to discuss problems,progress, and goals. Formulized individual treatment plan based on medical history, underlying problem and comorbidities.  2.  Impaired mobility -DVT/anticoagulation:  Pharmaceutical: Continue Lovenox             -antiplatelet therapy: Low dose ASA.  3. Pain Management: Continue Voltaren gel for left shoulder subluxation.              Moderate increased exertion 4. Mood: LCSW to follow for evaluation and support.              -antipsychotic agents: N/A 5. Neuropsych: This patient is capable of making decisions on his own behalf. 6. Skin/Wound Care: Routine pressure relief measures 7. Fluids/Electrolytes/Nutrition: Monitor I/Os. Encourage fluid intake.  8. HTN: Monitor BP tid--continue HCTZ, Norvasc, and metoprolol              Vitals:   09/27/20 2006 09/28/20 0510  BP: 125/74 134/71  Pulse: 67 (!) 59  Resp: 18 18  Temp: 98.9 F (37.2 C) 98.2 F (36.8 C)  SpO2: 100% 100%   controlled 4/20 9. CKD III: Will monitor renal status with serial checks             --avoid hypoperfusion             --BUN/SCr 21/1.31 at admission-->34/1.42. now 18 and 2.17, IVF at noc             4/11: 1.53 on 4/11,at baseline off IVF 1.51 on 4/18 10. Dyslipidemia: Lipitor 11. Impaired fasting glucose:  Hgb A1c-4.7- normal             --  Elevated fasting BS likely due to stress.will not need CBG monitoring long term              Moderate increased mobility 12. Tobacco abuse             Counsel 13. Hypokalemia: K+ 3.2, supplement with 33mq BID today , repeat 3.4 will give daily dose of 262m given pt on HCTZ- recheck 3.7 on 4/18 cont current dose of KCL   14.  Left shoulder subacromial impingement syndrome- pt states pain is severe, due to CVA not a good candidate for NSAIDs, will order celestone an dlidocaine for L subacromial injection  [place]: [place] Edward Trevino 09/28/2020, 8:47 AM

## 2020-09-28 NOTE — Progress Notes (Signed)
Physical Therapy Session Note  Patient Details  Name: Darryl Sullivan MRN: [id number] Date of Birth: Aug 11, 1965  Today's Date: 09/27/2020 PT Individual Time:  1345-1445  PT Individual Time Calculation (min): 60 min  Short Term Goals: Week 2:  PT Short Term Goal 1 (Week 2): Pt will ambulate 164ft wth min assist and LRAD PT Short Term Goal 2 (Week 2): Pt will transfer to and from [facility] with CGA and LRAd PT Short Term Goal 3 (Week 2): Pt will propell WC >116ft with supervision assist   Skilled Therapeutic Interventions/Progress Updates:  Patient supine in bed upon PT arrival. Patient alert and agreeable to PT session. Patient denied pain during session. Shoes and L AFO donned Max A for time.   Therapeutic Activity: Bed Mobility: Patient performed supine <> sit with overall supervision using bed rail for UE support and vc for technique. Transfers: Patient performed sit to/from stand from mat table to no AD with focus on midline orientation for equal WB and equal effort R vs L.. Provided verbal cues for correcting L lean with fatigue.   Gait Training:  Patient ambulated 110' x1/ 44' x1/ 19' x1 using RW initially for longer bout with CGA and then progressed to [facility] for other bouts. Provided verbal cues for HW placement, LLE step height and placement, RLE step length, and advancement of L hip with swing phase of gait sequence.  Wheelchair Mobility:  Patient propelled wheelchair 75 feet using BLE and RUE requiring intermittent pause to adjust sitting position as ROHO cushion and bil pull with hamstrings causes pt to slide forward in w/c. Provided verbal cues for increasing LLE step height and effort in pull of w/c.   Neuromuscular Re-ed: NMR facilitated during session with focus onsit to/from stand from mat table to no AD with focus on midline orientation for equal WB and equal effort R vs L. Provided verbal cues for correcting L lean with fatigue and then slow descent to sit with no UE support.  Improves in motor control with blocked practice and focus. Completes correctly and progresses from min A for power up initially to supervision overall. NMR performed for improvements in motor control and coordination, balance, sequencing, judgement, and self confidence/ efficacy in performing all aspects of mobility at highest level of independence.   Patient supine in bed at end of session with brakes locked, bed alarm set, and all needs within reach. Requesting NT for assist with brief change.     Therapy Documentation Precautions:  Precautions Precautions: Fall Precaution Comments: L hemi UE> LE, L AFO, mild dysarthria Restrictions Weight Bearing Restrictions: No  Therapy/Group: Individual Therapy  Loel Dubonnet PT DPT 09/27/2020, 5:40 PM

## 2020-09-28 NOTE — Progress Notes (Signed)
Patient ID: Darryl Sullivan, male   DOB: 09-May-1966, 55 y.o.   MRN: [id number]   Bedside Commode and Transfer Bench ordered through [employer].  [place], [place] [phone]

## 2020-09-28 NOTE — Patient Care Conference (Signed)
Inpatient RehabilitationTeam Conference and Plan of Care Update Date: 09/28/2020   Time: 10:14 AM    Patient Name: Darryl Sullivan      Medical Record Number: [id number]  Date of Birth: May 17, 1966 Sex: Male         Room/Bed: 4W13C/4W13C-01 Payor Info: Payor: BLUE CROSS BLUE [employer]IELD / Plan: BCBS COMM PPO / Product Type: *No Product type* /    Admit Date/Time:  09/14/2020 12:36 PM  Primary Diagnosis:  Stroke of right basal ganglia [facility])  Hospital Problems: Principal Problem:   Stroke of right basal ganglia [facility])    Expected Discharge Date: Expected Discharge Date: 10/07/20  Team Members Present: Physician leading conference: Dr. Claudette Laws Care Coodinator Present: Chana Bode, RN, BSN, CRRN;Christina Vita Barley, BSW Nurse Present: Chana Bode, RN PT Present: Other (comment) Sharol Harness, PT) OT Present: Other (comment) Annye English, OT) SLP Present: Other (comment) Elon Spanner, SLP) PPS Coordinator present : Fae Pippin, SLP     Current Status/Progress Goal Weekly Team Focus  Bowel/Bladder   Continent of B/B with a few incontinent episodes. LBM 4/18  Reamin continent and have no incontinent episodes  Assess PRN, Keep urinal at bedside   Swallow/Nutrition/ Hydration             ADL's   S to set-up UBB/UBD, grooming, self-feeding; min LBD, max shoes/AFO, min toilet/shower transfer via stand-pivot with RW, Brunstrum II-III LUE, I to II hand, noted slight thumb/index finger flexion  CGA transfers, S UB/LB ADL, anticipate should meet  LUE NMR, ADL/balance/functional transfer retraining, activity tolerance, family/pt/DME edu   Mobility   Bed mobility = Supervision/ CGA; transfers = Supervision requiring several attempts for power up; Gait = 80-100' using hemiwalker with CGA  overall CGA/ Min A for functional mobility  Continued work on standing balance, NMR and strengthening for LLE, improving quality of gait and transfers   Communication   Evaluation only. SLP tx  not recommended.  patient at baseline      Safety/Cognition/ Behavioral Observations            Pain   No c/o pain  <3  Assess Q shift and PRN   Skin   Skin intact  Skin remains free of breakdown  Assess Qshift and PRN     Discharge Planning:  Pt plans to d/c home with nephew with intermittent A, daughter able to asssit. . Family edu scheduled. 1 level home, 4 steps to enter   Team Discussion: Pain in left shoulder; MD to inject today. Oral intake improved; IVF discontinued.  Patient on target to meet rehab goals: Currently, CGA for lower body care and shower transfers. Requires max assist to don/doff AFOs. Able to ambulate 76' with CGA using a SBQC or hemiwalker. Goals set for superversion with ADLs and CGA for transfers.  *See Care Plan and progress notes for long and short-term goals.   Revisions to Treatment Plan:  SLP referral for decreased speech with mild reduced phonation. SLP provided recommendations for treatment and have signed off services  Teaching Needs: Assistance to don/doff AFO, transfers, medications, secondary risk management, etc.   Current Barriers to Discharge: Decreased caregiver support and Home enviroment access/layout  Possible Resolutions to Barriers:  Family education    Medical Summary Current Status: Severe left shoulder pain, BUN/creatinine are improving, fluid intake improving, occasional mild elevation of blood pressure.  Barriers to Discharge: Other (comments);Weight;Nutrition means   Possible Resolutions to [employer] Focus: Plan for corticosteroid injection of the left shoulder to help with  left shoulder pain   Continued Need for Acute Rehabilitation Level of Care: The patient requires daily medical management by a physician with specialized training in physical medicine and rehabilitation for the following reasons: Direction of a multidisciplinary physical rehabilitation program to maximize functional independence : Yes Medical  management of patient stability for increased activity during participation in an intensive rehabilitation regime.: Yes Analysis of laboratory values and/or radiology reports with any subsequent need for medication adjustment and/or medical intervention. : Yes   I attest that I was present, lead the team conference, and concur with the assessment and plan of the team.   Chana Bode B 09/28/2020, 2:27 PM

## 2020-09-28 NOTE — Progress Notes (Signed)
Occupational Therapy Session Note  Patient Details  Name: Darryl Sullivan MRN: [id number] Date of Birth: Jun 22, 1965  Today's Date: 09/28/2020 OT Individual Time: 0902-1000 OT Individual Time Calculation (min): 58 min + 25 min   Short Term Goals: Week 2:  OT Short Term Goal 1 (Week 2): Pt will demonstrate improved LUE management during functional transfers with min VCs. OT Short Term Goal 2 (Week 2): Pt will complete STS in prep for standing ADL with close S + AE PRN. OT Short Term Goal 3 (Week 2): Pt will don/doff B shoes with min A.  Skilled Therapeutic Interventions/Progress Updates:    Session 1 ([phone]): Pt received semi-reclined in bed, denies pain, agreeable to therapy. Came to sitting EOB with close S and use of bed rail. Doffed shirt close S. Donned new shirt with min VCs to thread LUE first, donned pants with close S for STS with use of bed rail. Max A to don B shoes/L AFO. Self-propelled w/c with mod VCs for hemi technique for ~15 ft and increased time. Participated in various LUE WB/NMR exercises in standing. C/o L pectoral/shoulder pain with ~75 degrees L shoulder flexion.   1:1 NMES applied to L digit extensors. Facilitated active grasp and release with towel, noted improved active digit flexion this date than in previous sessions.   Ratio 1:3 Rate 35 pps Waveform- Asymmetric Ramp 1.0 Pulse 643 Intensity- 19 clicks Duration -   5 min  No adverse reactions after treatment and is skin intact. No c/o pain.    Pt left in w/c with safety belt alarm engaged, call bell in reach, and all immediate needs met.    Session 2 [phone]): Pt received seated in w/c with NT present, agreeable to therapy. Session focus on LUE NMR in prep for improved functional use of LUE/bimanual ADL performance. W/c transport to and from gym 2/2 time management. Stand-pivot to and from mat table with hemi-walker and min A to prevent L knee buckling. Transitioned to R side-lying with CGA. Completed  1x15 of L shoulder flexion, scapular protraction/retraction, and elbow extension with assist to support LUE. Cont to c/o of L pectoral/shoulder pain with active elbow extension, resolves with rest. Transitioned back to sitting close S. W/c transport back to room and pt agreeable to remain seated in w/c for next PT session.   Pt left seated in w/c with safety belt alarm engaged, call bell in reach, and all immediate needs met.    Therapy Documentation Precautions:  Precautions Precautions: Fall Precaution Comments: L hemi UE> LE, L AFO, mild dysarthria Restrictions Weight Bearing Restrictions: No Pain:   see session notes ADL: See Care Tool for more details.   Therapy/Group: Individual Therapy  Volanda Napoleon MS, OTR/L  09/28/2020, 6:45 AM

## 2020-09-28 NOTE — Progress Notes (Signed)
Patient ID: Darryl Sullivan, male   DOB: [birth year], 55 y.o.   MRN: [id number] Team Conference Report to Patient/Family  Team Conference discussion was reviewed with the patient and caregiver, including goals, any changes in plan of care and target discharge date.  Patient and caregiver express understanding and are in agreement.  The patient has a target discharge date of 10/07/20.  SW met with pt to to provide conference updates. Called daughter ad bedside, now answer- left VM.  Dyanne Iha 09/28/2020, 1:47 PM

## 2020-09-28 NOTE — Progress Notes (Signed)
[facility] will deliver resting hand splint and prafo boot tomorrow. Was delivered orig while in acute and never made it to floor.

## 2020-09-28 NOTE — Procedures (Signed)
Shoulder injection Left subacromial   Indication:Left Shoulder pain not relieved by medication management and other conservative care.  Informed consent was obtained after describing risks and benefits of the procedure with the patient, this includes bleeding, bruising, infection and medication side effects. The patient wishes to proceed and has given written consent. Patient was placed in a seated position. The Left shoulder was marked and prepped with betadine in the subacromial area. A 25-gauge 1-1/2 inch needle was inserted into the subacromial area. After negative draw back for blood, a solution containing 1 mL of 6 mg per ML betamethasone and 4 mL of 1% lidocaine was injected. A band aid was applied. The patient tolerated the procedure well. Post procedure instructions were given. 

## 2020-09-28 NOTE — Progress Notes (Signed)
Physical Therapy Session Note  Patient Details  Name: Darryl Sullivan MRN: [id number] Date of Birth: 25-Nov-1965  Today's Date: 09/29/2020 PT Individual Time:  -      Short Term Goals: Week 2:  PT Short Term Goal 1 (Week 2): Pt will ambulate 117ft wth min assist and LRAD PT Short Term Goal 2 (Week 2): Pt will transfer to and from [facility] with CGA and LRAd PT Short Term Goal 3 (Week 2): Pt will propell WC >124ft with supervision assist  Skilled Therapeutic Interventions/Progress Updates:  Patient seated upright in w/c upon PT arrival. Patient alert and agreeable to PT session. Patient relates discomfort along shin at shinplate of AFO. AFO doffed, skin checked with no redness or irritation noted, sock and pants smoothed and AFO donned again with improved comfort.   Therapeutic Activity: Bed Mobility: Patient performed sit--> supine with supervision and use of bedrail for UE. RLE assisting LLE onto bed surface.  Provided verbal cues for increasing use of LLE musculature. Transfers: Patient performed STS and SPVT transfers throughout session with supervision to/ from [facility]. Requiring vc for foot positioning, forward lean and technique as well as to maintain midline and equal BLE use.   Gait Training:  Patient ambulated 90 feet using HW with CGA and w/c follow for fatigue. Provided verbal cues for LLE advacement and placement, L hip advancement with swing phase, and step through with RLE.   Wheelchair Mobility:  Patient propelled wheelchair 50 feet with supervision. Provided verbal cues for reciprocating BLE for propel of w/c and use of RUE with LLE for active assist.   Therapeutic Exercise: Patient performed the following seated exercises with verbal and tactile cues for proper technique and NMR - Marches 2x10 with focus on hold in max range and slow, eccentric return. Min A req's initially to decrease sartorius involvement. - LAQs 4x10 with focus on 3sec hold in max range. - Hamstring curls 2x10  with improving performance from AAROM to AROM.  - hip abd 2x10 against PT resistance - LLE ankle DF with pt demonstrating 2-/ 5 muscle activation. Performs 1x10 - LLE ankle PF performed with 1 to 2-/ 5 muscle activation and slight improvement with bilateral simultaneous performance.   Pt encouraged to continue bilateral ankle pumps while supine in bed each day in order to promote continued strengthening and NMR.   Patient supine in bed at end of session with brakes locked, bed alarm set, and all needs within reach.   Therapy Documentation Precautions:  Precautions Precautions: Fall Precaution Comments: L hemi UE> LE, L AFO, mild dysarthria Restrictions Weight Bearing Restrictions: No  Therapy/Group: Individual Therapy  Loel Dubonnet Pt, DPT 09/28/2020, 5:05 PM

## 2020-09-29 NOTE — Progress Notes (Signed)
PROGRESS NOTE   Subjective/Complaints:    ROS- neg CP SOB, denies pain, constipation  Objective:   No results found. No results for input(s): WBC, HGB, HCT, PLT in the last 72 hours. No results for input(s): NA, K, CL, CO2, GLUCOSE, BUN, CREATININE, CALCIUM in the last 72 hours.  Intake/Output Summary (Last 24 hours) at 09/29/2020 0817 Last data filed at 09/29/2020 0121 Gross per 24 hour  Intake 480 ml  Output 1150 ml  Net -670 ml        Physical Exam: Vital Signs Blood pressure (!) 160/88, pulse 84, temperature 98.1 F (36.7 C), temperature source Oral, resp. rate 18, height 5\' 7"  (1.702 m), weight 100.3 kg, SpO2 99 %.  General: No acute distress Mood and affect are appropriate Heart: Regular rate and rhythm no rubs murmurs or extra sounds Lungs: Clear to auscultation, breathing unlabored, no rales or wheezes Abdomen: Positive bowel sounds, soft nontender to palpation, nondistended Extremities: No clubbing, cyanosis, or edema Skin: No evidence of breakdown, no evidence of rash  Musculoskeletal: Full range of motion in all 4 extremities. No joint swelling  Neurologic:  motor strength is 5/5 in right , 2- Left  deltoid, bicep, tricep, 5/5 RIght and 2- left grip, 5/5 R and 3- Left hip flexor, knee extensors, 0 ankle dorsiflexor and trace plantar flexor Sensory exam normal sensation to light touch and proprioception in bilateral upper and lower extremities  Musculoskeletal: Full range of motion in all 4 extremities. No joint swelling, , + Hawkins-Kennedy but able to flex an dabduct passively without pain   Assessment/Plan: 1. Functional deficits which require 3+ hours per day of interdisciplinary therapy in a comprehensive inpatient rehab setting.  Physiatrist is providing close team supervision and 24 hour management of active medical problems listed below.  Physiatrist and rehab team continue to assess barriers  to discharge/monitor patient progress toward functional and medical goals  Care Tool:  Bathing    Body parts bathed by patient: Left upper leg,Right upper leg,Front perineal area,Abdomen,Chest,Left arm,Face,Buttocks,Right lower leg,Left lower leg   Body parts bathed by helper: Right arm     Bathing assist Assist Level: Minimal Assistance - Patient > 75%     Upper Body Dressing/Undressing Upper body dressing   What is the patient wearing?: Pull over shirt    Upper body assist Assist Level: Supervision/Verbal cueing    Lower Body Dressing/Undressing Lower body dressing      What is the patient wearing?: Pants     Lower body assist Assist for lower body dressing: Supervision/Verbal cueing     Toileting Toileting    Toileting assist Assist for toileting: Minimal Assistance - Patient > 75%     Transfers Chair/bed transfer  Transfers assist     Chair/bed transfer assist level: Contact Guard/Touching assist     Locomotion Ambulation   Ambulation assist      Assist level: Moderate Assistance - Patient 50 - 74% Assistive device: Walker-rolling Max distance: 55 ft   Walk 10 feet activity   Assist     Assist level: Moderate Assistance - Patient - 50 - 74% Assistive device: Walker-rolling   Walk 50 feet activity   Assist Walk  50 feet with 2 turns activity did not occur: Safety/medical concerns  Assist level: Moderate Assistance - Patient - 50 - 74% Assistive device: Walker-rolling    Walk 150 feet activity   Assist Walk 150 feet activity did not occur: Safety/medical concerns         Walk 10 feet on uneven surface  activity   Assist Walk 10 feet on uneven surfaces activity did not occur: Safety/medical concerns   Assist level: Moderate Assistance - Patient - 50 - 74% Assistive device: Photographer Will patient use wheelchair at discharge?: Yes Type of Wheelchair: Manual Wheelchair activity did not  occur: Safety/medical concerns  Wheelchair assist level: Contact Guard/Touching assist,Minimal Assistance - Patient > 75%      Wheelchair 50 feet with 2 turns activity    Assist    Wheelchair 50 feet with 2 turns activity did not occur: Safety/medical concerns   Assist Level: Minimal Assistance - Patient > 75%,Contact Guard/Touching assist (min A with fatigue)   Wheelchair 150 feet activity     Assist  Wheelchair 150 feet activity did not occur: Safety/medical concerns       Blood pressure (!) 160/88, pulse 84, temperature 98.1 F (36.7 C), temperature source Oral, resp. rate 18, height 5\' 7"  (1.702 m), weight 100.3 kg, SpO2 99 %.  Medical Problem List and Plan: 1.  Left hemiparesis, deficits with mobility, self-care secondary to right basal ganglia infarct with white matter changes with left basal ganglia punctate hemorrhage post TPA             -patient may shower             -ELOS/Goals: 4/29/supervision/min a             [place] PT, OT- 2.  Impaired mobility -DVT/anticoagulation:  Pharmaceutical: Continue Lovenox             -antiplatelet therapy: Low dose ASA.  3. Pain Management: Continue Voltaren gel for left shoulder subluxation.              Moderate increased exertion 4. Mood: LCSW to follow for evaluation and support.              -antipsychotic agents: N/A 5. Neuropsych: This patient is capable of making decisions on his own behalf. 6. Skin/Wound Care: Routine pressure relief measures 7. Fluids/Electrolytes/Nutrition: Monitor I/Os. Encourage fluid intake.  8. HTN: Monitor BP tid--continue HCTZ, Norvasc, and metoprolol              Vitals:   09/28/20 1939 09/29/20 0510  BP: (!) 159/86 (!) 160/88  Pulse: 79 84  Resp: 18 18  Temp: 98.3 F (36.8 C) 98.1 F (36.7 C)  SpO2: 95% 99%  elevated 4/21 will monitor prior to dosage change 9. CKD III: Will monitor renal status with serial checks             --avoid hypoperfusion             --BUN/SCr  21/1.31 at admission-->34/1.42. now 57 and 2.17, IVF at noc             4/11: 1.53 on 4/11,at baseline off IVF 1.51 on 4/18 10. Dyslipidemia: Lipitor 11. Impaired fasting glucose:  Hgb A1c-4.7- normal             -- Elevated fasting BS likely due to stress.will not need CBG monitoring long term  Moderate increased mobility 12. Tobacco abuse             Counsel 13. Hypokalemia: K+ 3.2, supplement with BID today , repeat 3.4 will give daily dose of given pt on HCTZ- recheck 3.7 on 4/18 cont current dose of KCL   14.  Left shoulder subacromial impingement syndrome- pt states pain is severe, due to CVA not a good candidate for NSAIDs,  subacromial injection Lshoulder was helpful   LOS: 15 days A FACE TO FACE EVALUATION WAS PERFORMED  Erick Colace 09/29/2020, 8:17 AM

## 2020-09-29 NOTE — Progress Notes (Signed)
Resting hand splint delivered but Prafo boot still missing. Called ortho tech and will bring up.

## 2020-09-29 NOTE — Progress Notes (Signed)
Physical Therapy Session Note  Patient Details  Name: Darryl Sullivan MRN: [id number] Date of Birth: 1966-02-25  Today's Date: 09/29/2020 PT Individual Time: 1620-1720 PT Individual Time Calculation (min): 60 min   Short Term Goals: Week 2:  PT Short Term Goal 1 (Week 2): Pt will ambulate 171f wth min assist and LRAD PT Short Term Goal 2 (Week 2): [facility] Short Term Goal 3 (Week 2): Pt will propell WC >1559fwith supervision assist  Skilled Therapeutic Interventions/Progress Updates:   Pt received supine in bed and agreeable to PT. Supine>sit transfer with supervision assist and cues for decreased use of bed rail as able. PT assisted pt to don shoes from EOB with max assist. Ambulatory transfer to bathroom for urination with supervision assist-min assist and HW. Able to void standing at toilet. Noted to have been in continet of bladders prior to urination with PT. Donning clean brief with min assist to pull to waist and supevsion assist for balance.   Pt transported to rehab gym. Stair management training with 1 UE support on rails with instruction for improved step to gait pattern and coordionation of the LLE on descent to allow improved gluteal activation.   [facility] x 10052fnd [id number] CGA from PT with tactil instruction to improve pelvic rotation and increased activation of gluteals. NMR performed to the LUE, chest press/shoulder retraction with mod assist and facilitation to engage parascapular muscles and triceps as needed. Sit<>stand with BUE to push from LLE x 5 and with LLE in dominate position slightly behind the RLE x 5. Cues for improved weight shifting and terminal knee/hip extension in stance.   Pt returned to room and performed stand pivot transfer to bed with HW and CGA from PT. Sit>supine completed with supervision assist and andincreased time for control of the LLE. Pt   left supine  in bed with call bell in reach and all needs met.        Therapy Documentation Precautions:  Precautions Precautions: Fall Precaution Comments: L hemi UE> LE, L AFO, mild dysarthria Restrictions Weight Bearing Restrictions: No    Vital Signs: Therapy Vitals Temp: 98.2 F (36.8 C) Temp Source: Oral Pulse Rate: 68 Resp: 15 BP: 134/76 Patient Position (if appropriate): Lying Oxygen Therapy SpO2: 100 % O2 Device: Room Air Pain:   denies   Therapy/Group: Individual Therapy  AustLorie Phenix1/2022, 5:43 PM

## 2020-09-29 NOTE — Progress Notes (Signed)
Orthopedic Tech Progress Note Patient Details:  Darryl Sullivan Nov 01, 1965 [id number] Ordered brace Patient ID: Darryl Sullivan, male   DOB: [birth year], 55 y.o.   MRN: [id number]   Darryl Sullivan 09/29/2020, 10:18 AM

## 2020-09-29 NOTE — Progress Notes (Signed)
Patient ID: Darryl Sullivan, male   DOB: [birth year], 55 y.o.   MRN: [id number]   Pt set with PCP at [facility] with Dr. Smitty Cords, DO on Oct 31, 2020 at 10:00 AM  Lavera Guise, [place] [phone]

## 2020-09-29 NOTE — Progress Notes (Signed)
Occupational Therapy Session Note  Patient Details  Name: Darryl Sullivan MRN: [id number] Date of Birth: Nov 08, 1965  Today's Date: 09/29/2020 OT Individual Time: 1005-1105 OT Individual Time Calculation (min): 60 min + 58 min   Short Term Goals: Week 2:  OT Short Term Goal 1 (Week 2): Pt will demonstrate improved LUE management during functional transfers with min VCs. OT Short Term Goal 2 (Week 2): Pt will complete STS in prep for standing ADL with close S + AE PRN. OT Short Term Goal 3 (Week 2): Pt will don/doff B shoes with min A.  Skilled Therapeutic Interventions/Progress Updates:    Session 1 (8110-3159): Pt received semi-reclined in bed, denies pain, agreeable to therapy. Came sitting EOB with close S and use of bed rail. Donned pants CGA for STS with bed rail. Stand-pivot throughout sesison with min A to block L knee with use of hemi-walker. Stood to brush tooth at sink CGA. W/c transport to and from gym 2/2 time management. Stood to place clothes in washing machine CGA. In prep for improved activity tolerance and LUE/LLE NMR, completed 6 min on NuStep at level 6 resistance, utilized ace wrap to facilitate L grip, denies fatigue. Additionally, practiced side stepping up and down mat in prep for improved toilet/shower transfers. Req light min A to manage LLE 2/2 poor knee control. Finally, completed massed practice of L forearm supination/pronation with 1 lb dumbbell and use of Ace wrap to facilitate grasp.    Pt left in w/c with safety belt alarm engaged, call bell in reach, and all immediate needs met.    Session 2 (1005-1105): Pt received seated in w/c, denies pain, agreeable to therapy. Stand-pivot to urinate at toilet with CGA + use of R grab bar, CGA for LB clothing management. Stand-pivot into shower with CGA and front grab bar. Bathed full-body min A to bathe RUE. Doffed/donned shirt close S, doffed/donned brief/pants with light min A to pull brief up in back and readjust straps.  Doffed B shoes mod A to remove L shoe/AFO, pt able to remove R. Max A to redon. W/c transport to therapy bathroom, demonstrated TTB transfer and pt completed with overall min A to management LLE. Discussed bathroom set-up, pt doesn't think his w/c will fit into the bathroom. Discussed current recommendations to have CGA for all functional transfers at this time upon DC. Stand-pivot back to bed with CGA and use of bed rail. Doffed B shoes mod A to remove L shoe/AFO.  Pt left semi-reclined in bed with bed alarm engaged, call bell in reach, and all immediate needs met.    Therapy Documentation Precautions:  Precautions Precautions: Fall Precaution Comments: L hemi UE> LE, L AFO, mild dysarthria Restrictions Weight Bearing Restrictions: No Pain: see session notes   ADL: See Care Tool for more details.   Therapy/Group: Individual Therapy  Volanda Napoleon MS, OTR/L  09/29/2020, 6:44 AM

## 2020-09-29 NOTE — Progress Notes (Signed)
Patient ID: Darryl Sullivan, male   DOB: 04/21/1966, 55 y.o.   MRN: [id number]   SW provided pt and daughter with [facility] agency's in pt's area to review. Pt will follow up with SW with selection  Lavera Guise, [place] [phone]

## 2020-09-29 NOTE — Progress Notes (Signed)
Patient ID: Darryl Sullivan, male   DOB: 07-11-1965, 55 y.o.   MRN: [id number] Follow up with the patient regarding educational needs. Noted family was helping to obtain and completed FMLA paperwork; patient instructed to have family bring in paperwork if MD needed to sign or complete portions before it was returned to his job. Patient had questions about carb counting and whether or not he could eat chips and cookies for snacks. Reviewed 4 carb/meal and 2 carb snack diet and snack options including chips and cookies but in moderation and how to calculate how many he could have for a snack. Also reviewed eating a protein with the carb to promote improved metabolism of the carb. Touched on ETOH/Beer per patient request and referred patient to recommendations per DASH diet and eating after a stroke on the amount of beer /alcholol for men/day. Also reinforced smoking cessation with review of rationale and effects of smoking on the health and tips for dealing with smoking craving. Lastly, discussed hypokalemia and dietary modifications he could change to increase the amount of potassium taken in. Patient has handouts and books at the bedside to refer to for more in-depth information. Noted an understanding of the information we reviewed. Pamelia Hoit

## 2020-09-30 NOTE — Progress Notes (Signed)
PROGRESS NOTE   Subjective/Complaints:  Left shoulder pain improved after injection , dressing self except shoes and socks   ROS- neg CP SOB, denies pain, constipation  Objective:   No results found. No results for input(s): WBC, HGB, HCT, PLT in the last 72 hours. No results for input(s): NA, K, CL, CO2, GLUCOSE, BUN, CREATININE, CALCIUM in the last 72 hours.  Intake/Output Summary (Last 24 hours) at 09/30/2020 0823 Last data filed at 09/30/2020 0402 Gross per 24 hour  Intake 840 ml  Output 700 ml  Net 140 ml        Physical Exam: Vital Signs Blood pressure (!) 145/77, pulse 63, temperature 98.6 F (37 C), temperature source Oral, resp. rate 17, height 5\' 7"  (1.702 m), weight 100.3 kg, SpO2 99 %.  General: No acute distress Mood and affect are appropriate Heart: Regular rate and rhythm no rubs murmurs or extra sounds Lungs: Clear to auscultation, breathing unlabored, no rales or wheezes Abdomen: Positive bowel sounds, soft nontender to palpation, nondistended Extremities: No clubbing, cyanosis, or edema Skin: No evidence of breakdown, no evidence of rash  Neurologic:  motor strength is 5/5 in right , 2- Left  deltoid, bicep, tricep, 5/5 RIght and 2- left grip, 5/5 R and 3- Left hip flexor, knee extensors, 0 ankle dorsiflexor and trace plantar flexor Sensory exam normal sensation to light touch and proprioception in bilateral upper and lower extremities  Musculoskeletal: Full range of motion in all 4 extremities. No joint swelling, , + Hawkins-Kennedy but able to flex an dabduct passively without pain   Assessment/Plan: 1. Functional deficits which require 3+ hours per day of interdisciplinary therapy in a comprehensive inpatient rehab setting.  Physiatrist is providing close team supervision and 24 hour management of active medical problems listed below.  Physiatrist and rehab team continue to assess barriers to  discharge/monitor patient progress toward functional and medical goals  Care Tool:  Bathing    Body parts bathed by patient: Left upper leg,Right upper leg,Front perineal area,Abdomen,Chest,Left arm,Face,Buttocks,Right lower leg,Left lower leg   Body parts bathed by helper: Right arm     Bathing assist Assist Level: Minimal Assistance - Patient > 75%     Upper Body Dressing/Undressing Upper body dressing   What is the patient wearing?: Pull over shirt    Upper body assist Assist Level: Supervision/Verbal cueing    Lower Body Dressing/Undressing Lower body dressing      What is the patient wearing?: Pants,Incontinence brief     Lower body assist Assist for lower body dressing: Minimal Assistance - Patient > 75%     Toileting Toileting    Toileting assist Assist for toileting: Contact Guard/Touching assist     Transfers Chair/bed transfer  Transfers assist     Chair/bed transfer assist level: Contact Guard/Touching assist     Locomotion Ambulation   Ambulation assist      Assist level: Moderate Assistance - Patient 50 - 74% Assistive device: Walker-rolling Max distance: 55 ft   Walk 10 feet activity   Assist     Assist level: Moderate Assistance - Patient - 50 - 74% Assistive device: Walker-rolling   Walk 50 feet activity  Assist Walk 50 feet with 2 turns activity did not occur: Safety/medical concerns  Assist level: Moderate Assistance - Patient - 50 - 74% Assistive device: Walker-rolling    Walk 150 feet activity   Assist Walk 150 feet activity did not occur: Safety/medical concerns         Walk 10 feet on uneven surface  activity   Assist Walk 10 feet on uneven surfaces activity did not occur: Safety/medical concerns   Assist level: Moderate Assistance - Patient - 50 - 74% Assistive device: Photographer Will patient use wheelchair at discharge?: Yes Type of Wheelchair: Manual Wheelchair  activity did not occur: Safety/medical concerns  Wheelchair assist level: Contact Guard/Touching assist,Minimal Assistance - Patient > 75%      Wheelchair 50 feet with 2 turns activity    Assist    Wheelchair 50 feet with 2 turns activity did not occur: Safety/medical concerns   Assist Level: Minimal Assistance - Patient > 75%,Contact Guard/Touching assist (min A with fatigue)   Wheelchair 150 feet activity     Assist  Wheelchair 150 feet activity did not occur: Safety/medical concerns       Blood pressure (!) 145/77, pulse 63, temperature 98.6 F (37 C), temperature source Oral, resp. rate 17, height 5\' 7"  (1.702 m), weight 100.3 kg, SpO2 99 %.  Medical Problem List and Plan: 1.  Left hemiparesis, deficits with mobility, self-care secondary to right basal ganglia infarct with white matter changes with left basal ganglia punctate hemorrhage post TPA             -patient may shower             -ELOS/Goals: 4/29/supervision/min a             [facility] PT, OT- 2.  Impaired mobility -DVT/anticoagulation:  Pharmaceutical: Continue Lovenox             -antiplatelet therapy: Low dose ASA.  3. Pain Management: Continue Voltaren gel for left shoulder subluxation.              Moderate increased exertion 4. Mood: LCSW to follow for evaluation and support.              -antipsychotic agents: N/A 5. Neuropsych: This patient is capable of making decisions on his own behalf. 6. Skin/Wound Care: Routine pressure relief measures 7. Fluids/Electrolytes/Nutrition: Monitor I/Os. Encourage fluid intake.  8. HTN: Monitor BP tid--continue HCTZ, Norvasc, and metoprolol              Vitals:   09/29/20 1926 09/30/20 0403  BP: 136/81 (!) 145/77  Pulse: 72 63  Resp: 18 17  Temp: 98.6 F (37 C) 98.6 F (37 C)  SpO2: 100% 99%  mild elevation this am monitor  9. CKD III: Will monitor renal status with serial checks             --avoid hypoperfusion             --BUN/SCr 21/1.31 at  admission-->34/1.42. now 57 and 2.17, IVF at noc             4/11: 1.53 on 4/11,at baseline off IVF 1.51 on 4/18 10. Dyslipidemia: Lipitor 11. Impaired fasting glucose:  Hgb A1c-4.7- normal             -- Elevated fasting BS likely due to stress.will not need CBG monitoring long term  Moderate increased mobility 12. Tobacco abuse             Counsel 13. Hypokalemia: K+ 3.2, supplement with BID today , repeat 3.4 will give daily dose of given pt on HCTZ- recheck 3.7 on 4/18 cont current dose of KCL - recheck 4/25  14.  Left shoulder subacromial impingement syndrome- pt states pain is severe, due to CVA not a good candidate for NSAIDs,  subacromial injection Lshoulder was helpful on 4/20  LOS: 16 days A FACE TO FACE EVALUATION WAS PERFORMED  Erick Colace 09/30/2020, 8:23 AM

## 2020-09-30 NOTE — Progress Notes (Signed)
Physical Therapy Session Note  Patient Details  Name: Darryl Sullivan MRN: [id number] Date of Birth: [birth year]  Today's Date: 09/30/2020 PT Individual Time: 1030-1112 PT Individual Time Calculation (min): 42 min   Short Term Goals: Week 2:  PT Short Term Goal 1 (Week 2): Pt will ambulate 148ft wth min assist and LRAD PT Short Term Goal 2 (Week 2): Pt will transfer to and from [facility] with CGA and LRAd PT Short Term Goal 3 (Week 2): Pt will propell WC >127ft with supervision assist  Skilled Therapeutic Interventions/Progress Updates:    Patient received sitting up in wc, agreeable to PT. He denies pain. PT transporting patient in wc to therapy gym for time management and energy conservation. He was able to ambulate 78ft with HW and CGA, L AFO. Step- to gait pattern with L flat foot contact due to shortened L step length. Patient ambulating additional 41ft with HW, CGA, L AFO + 4# ankle weight for improved proprioceptive input. Patient able to achieve longer L step length and therefore L heel contact. Modified D1 PNF diagonal in standing stepping L LE onto 4" box with 4# ankle weight 2x12. CGA + HW used for postural support. Patient able to move out of tone pattern when provided with target to step foot to  (into abduction) otherwise, patient consistently moving L LE into adduction. Same task completed without 4# ankle weight x12. Patient requesting to return to room to adjust brief for comfort. He was found to have been incontinent of bladder. New brief donned in bathroom. Ambulating to bed with HW and CGA. Supervision to return supine. Bed alarm on, call light within reach.   Therapy Documentation Precautions:  Precautions Precautions: Fall Precaution Comments: L hemi UE> LE, L AFO, mild dysarthria Restrictions Weight Bearing Restrictions: No    Therapy/Group: Individual Therapy  Elizebeth Koller, PT, DPT, CBIS  09/30/2020, 7:42 AM

## 2020-09-30 NOTE — Progress Notes (Signed)
Occupational Therapy Session Note  Patient Details  Name: Darryl Sullivan MRN: [id number] Date of Birth: 1966/02/23  Today's Date: 09/30/2020 OT Individual Time: 0812-0902 OT Individual Time Calculation (min): 50 min    Short Term Goals: Week 2:  OT Short Term Goal 1 (Week 2): Pt will demonstrate improved LUE management during functional transfers with min VCs. OT Short Term Goal 2 (Week 2): Pt will complete STS in prep for standing ADL with close S + AE PRN. OT Short Term Goal 3 (Week 2): Pt will don/doff B shoes with min A.  Skilled Therapeutic Interventions/Progress Updates:     Pt received semi-reclined in bed, denies L shoulder pain, agreeable to therapy. Came to sitting EOB with use of bed rail and close S. Doffed/donned pants close S with use of bed rail for STS. Doffed shirt min A to pull over head as it was too small, donned new shirt close S. Donned B shoes/L AFO max A. Amb with RW with L hand orthotic grip to and from bathroom and w/c with CGA and min VCs for L foot clearance/L knee control. Toilet transfer CGA. Pt able to strap L hand in orthotic himself with min VCs. Stood to wash face at sink close S. W/c transport to and from gym 2/2 time management. Stand-pivot transfer to and from mat CGA with RW. Completed 2x10 of the following with 2 lb dowel rod in prep for improved LUE NMR/functional return: shoulder press, chest press, shoulder flexion and hold.    Pt left in w/c with safety belt alarm engaged, call bell in reach, and all immediate needs met.   Therapy Documentation Precautions:  Precautions Precautions: Fall Precaution Comments: L hemi UE> LE, L AFO, mild dysarthria Restrictions Weight Bearing Restrictions: No Pain: denies   ADL: See Care Tool for more details. Therapy/Group: Individual Therapy  Volanda Napoleon MS, OTR/L  09/30/2020, 12:43 PM

## 2020-09-30 NOTE — Progress Notes (Signed)
Physical Therapy Session Note  Patient Details  Name: Darryl Sullivan MRN: [id number] Date of Birth: 01-Apr-1966  Today's Date: 09/30/2020 PT Individual Time: 1335-1430 and 1645-1730 PT Individual Time Calculation (min): 55 min and 45 min    Short Term Goals: Week 2:  PT Short Term Goal 1 (Week 2): Pt will ambulate 125f wth min assist and LRAD PT Short Term Goal 2 (Week 2): [facility] Short Term Goal 3 (Week 2): Pt will propell WC >1588fwith supervision assist  Skilled Therapeutic Interventions/Progress Updates:  Session 1  Pt received supine in bed and agreeable to PT. Supine>sit transfer with heavy use of bed rail. Donning shoes EOB with total A. Toilet transfer with HW and CGA and cues for proper step to pattern to navigate threshold into bathroom. Pt able to void bowels sitting on toilet. PT performed hygiene and clothing management.   Gait training with HW in rehab gym with supervision assist-CGA for safety and clothing management. Mild knee GR with last 10 ft with pt reporting pain in the L shin due to pressure from AFO.   Standing balance with forced WB through the LLE while engaged in Wii bowling. Pt able to tolerate 10 minutes prior to noted fatigue in the LLE.  CGA throughout and cues for improved weight shift to the L.  Pt returned to room and performed  transfer to bed with supervision assist and HW. Sit>supine completed with supervision assist with cues for LLE control, and left supine in bed with call bell in reach and all needs met.   Session 2.  Pt received supine in bed and agreeable to PT. Supine>sit transfer with supervision assist. PT assist to don shoes but no AFO. Stand pivot transfer to toilet. Transported to toilet to perform standing urination. Noted to have been in continent prior to attempting toiletting.   Pt transported to entrance of [facility] with HW and no AFO 2 x 1542fith min assist. Cues for increased  step length on the L and sustained terminal knee extension as well as activation of DF on the LLE. Noted to have mild activation of the LLE DF intermittently. Forward/reverse 2 x 8ft58fth min A and cues for AD management and increased knee extension on the LLE.   Pt returned to room and performed stand pivot transfer to bed with HW and min assist for safety. Sit>supine completed with supervision assist for safety. Pt left supine in bed with call bell in reach and all needs met.           Therapy Documentation Precautions:  Precautions Precautions: Fall Precaution Comments: L hemi UE> LE, L AFO, mild dysarthria Restrictions Weight Bearing Restrictions: No Pain:   denies  Therapy/Group: Individual Therapy  AustLorie Phenix2/2022, 2:31 PM

## 2020-10-01 DIAGNOSIS — I10 Essential (primary) hypertension: Secondary | ICD-10-CM

## 2020-10-01 DIAGNOSIS — M7542 Impingement syndrome of left shoulder: Secondary | ICD-10-CM

## 2020-10-01 NOTE — Progress Notes (Signed)
PROGRESS NOTE   Subjective/Complaints:  No new issues. Denies pain this morning.   ROS: Patient denies fever, rash, sore throat, blurred vision, nausea, vomiting, diarrhea, cough, shortness of breath or chest pain, joint or back pain, headache, or mood change.    Objective:   No results found. No results for input(s): WBC, HGB, HCT, PLT in the last 72 hours. No results for input(s): NA, K, CL, CO2, GLUCOSE, BUN, CREATININE, CALCIUM in the last 72 hours.  Intake/Output Summary (Last 24 hours) at 10/01/2020 1033 Last data filed at 10/01/2020 0740 Gross per 24 hour  Intake 600 ml  Output 950 ml  Net -350 ml        Physical Exam: Vital Signs Blood pressure 125/68, pulse 60, temperature 98.2 F (36.8 C), temperature source Oral, resp. rate 16, height 5\' 7"  (1.702 m), weight 100.3 kg, SpO2 99 %.  Constitutional: No distress . Vital signs reviewed. HEENT: EOMI, oral membranes moist Neck: supple Cardiovascular: RRR without murmur. No JVD    Respiratory/Chest: CTA Bilaterally without wheezes or rales. Normal effort    GI/Abdomen: BS +, non-tender, non-distended Ext: no clubbing, cyanosis, or edema Psych: pleasant and cooperative Neurologic:  motor strength is 5/5 in right , 2- Left  deltoid, bicep, tricep, 5/5 RIght and 2- left grip, 5/5 R and 3- Left hip flexor, knee extensors, 0 ankle dorsiflexor and trace plantar flexor Sensory exam normal sensation to light touch and proprioception in bilateral upper and lower extremities  Musculoskeletal: Full range of motion in all 4 extremities. No joint swelling, Left shoulder minimally tender  Assessment/Plan: 1. Functional deficits which require 3+ hours per day of interdisciplinary therapy in a comprehensive inpatient rehab setting.  Physiatrist is providing close team supervision and 24 hour management of active medical problems listed below.  Physiatrist and rehab team  continue to assess barriers to discharge/monitor patient progress toward functional and medical goals  Care Tool:  Bathing    Body parts bathed by patient: Left upper leg,Right upper leg,Front perineal area,Abdomen,Chest,Left arm,Face,Buttocks,Right lower leg,Left lower leg   Body parts bathed by helper: Right arm     Bathing assist Assist Level: Minimal Assistance - Patient > 75%     Upper Body Dressing/Undressing Upper body dressing   What is the patient wearing?: Pull over shirt    Upper body assist Assist Level: Supervision/Verbal cueing    Lower Body Dressing/Undressing Lower body dressing      What is the patient wearing?: Pants     Lower body assist Assist for lower body dressing: Supervision/Verbal cueing     Toileting Toileting    Toileting assist Assist for toileting: Contact Guard/Touching assist     Transfers Chair/bed transfer  Transfers assist     Chair/bed transfer assist level: Contact Guard/Touching assist     Locomotion Ambulation   Ambulation assist      Assist level: Contact Guard/Touching assist Assistive device: Walker-hemi Max distance: 83   Walk 10 feet activity   Assist     Assist level: Contact Guard/Touching assist Assistive device: Walker-hemi   Walk 50 feet activity   Assist Walk 50 feet with 2 turns activity did not occur: Safety/medical concerns  Assist level: Contact Guard/Touching assist Assistive device: Walker-hemi    Walk 150 feet activity   Assist Walk 150 feet activity did not occur: Safety/medical concerns         Walk 10 feet on uneven surface  activity   Assist Walk 10 feet on uneven surfaces activity did not occur: Safety/medical concerns   Assist level: Moderate Assistance - Patient - 50 - 74% Assistive device: Photographer Will patient use wheelchair at discharge?: Yes Type of Wheelchair: Manual Wheelchair activity did not occur: Safety/medical  concerns  Wheelchair assist level: Contact Guard/Touching assist,Minimal Assistance - Patient > 75%      Wheelchair 50 feet with 2 turns activity    Assist    Wheelchair 50 feet with 2 turns activity did not occur: Safety/medical concerns   Assist Level: Minimal Assistance - Patient > 75%,Contact Guard/Touching assist (min A with fatigue)   Wheelchair 150 feet activity     Assist  Wheelchair 150 feet activity did not occur: Safety/medical concerns       Blood pressure 125/68, pulse 60, temperature 98.2 F (36.8 C), temperature source Oral, resp. rate 16, height 5\' 7"  (1.702 m), weight 100.3 kg, SpO2 99 %.  Medical Problem List and Plan: 1.  Left hemiparesis, deficits with mobility, self-care secondary to right basal ganglia infarct with white matter changes with left basal ganglia punctate hemorrhage post TPA             -patient may shower             -ELOS/Goals: 4/29/supervision/min a             -Continue CIR therapies including PT, OT, and SLP - 2.  Impaired mobility -DVT/anticoagulation:  Pharmaceutical: Continue Lovenox             -antiplatelet therapy: Low dose ASA.  3. Pain Management: Continue Voltaren gel for left shoulder subluxation.              Moderate increased exertion 4. Mood: LCSW to follow for evaluation and support.              -antipsychotic agents: N/A 5. Neuropsych: This patient is capable of making decisions on his own behalf. 6. Skin/Wound Care: Routine pressure relief measures 7. Fluids/Electrolytes/Nutrition: Monitor I/Os. Encourage fluid intake.  8. HTN: Monitor BP tid--continue HCTZ, Norvasc, and metoprolol              Vitals:   09/30/20 1932 10/01/20 0423  BP: 140/78 125/68  Pulse: 74 60  Resp: 18 16  Temp: 98.5 F (36.9 C) 98.2 F (36.8 C)  SpO2: 100% 99%  4/23 bp controlled 9. CKD III: Will monitor renal status with serial checks             --avoid hypoperfusion             --BUN/SCr 21/1.31 at admission-->34/1.42. now  57 and 2.17, IVF at noc             4/11: 1.53 on 4/11,at baseline off IVF 1.51 on 4/18 10. Dyslipidemia: Lipitor 11. Impaired fasting glucose:  Hgb A1c-4.7- normal             -- Elevated fasting BS likely due to stress.will not need CBG monitoring long term              Moderate increased mobility 12. Tobacco abuse             Counsel  13. Hypokalemia: K+ 3.2, supplement with BID today , repeat 3.4 will give daily dose of given pt on HCTZ- recheck 3.7 on 4/18 cont current dose of KCL - recheck 4/25  14.  Left shoulder subacromial impingement syndrome- pt states pain is severe, due to CVA not a good candidate for NSAIDs,  subacromial injection Lshoulder was helpful on 4/20  -4/23 pain still improved  LOS: 17 days A FACE TO FACE EVALUATION WAS PERFORMED  Ranelle Oyster 10/01/2020, 10:33 AM

## 2020-10-01 NOTE — Progress Notes (Signed)
Physical Therapy Session Note  Patient Details  Name: Darryl Sullivan MRN: [id number] Date of Birth: 1966/04/07  Today's Date: 10/01/2020 PT Individual Time: 1100-1155 PT Individual Time Calculation (min): 55 min   Short Term Goals: Week 2:  PT Short Term Goal 1 (Week 2): Pt will ambulate 136ft wth min assist and LRAD PT Short Term Goal 2 (Week 2): Pt will transfer to and from [facility] with CGA and LRAd PT Short Term Goal 3 (Week 2): Pt will propell WC >187ft with supervision assist  Skilled Therapeutic Interventions/Progress Updates:    Patient received sitting up in bed, agreeable to PT. He denies pain. Upon prepping to get dressed for therapy, PT observed that patient had been incontinent of bladder in brief. Patient reportedly unaware that he had been incontinent. Initially planning to ambulate to bathroom to attempt continent void and change brief, however, upon standing up, brief fell to floor due to weight of urine in the brief. He was able to come sit edge of bed with CGA and HOB elevated. Sit > stand with HW and CGA to complete perihygiene at bedside. Patient then found to have been slightly incontinent of bowel. More extensive perihygiene completed at bedside with PT providing CGA for postural balance and patient able to complete cleaning with supervision and verbal cues for thoroughness. Donned clean brief and pants with MaxA. Donned clean shirt with MinA and verbal cues for hemi technique. Patient able to transfer to wc via stand pivot with HW and CGA. PT transporting patient in wc to therapy gym for time management and energy conservation. NuStep completed x10 mins with multiple rest breaks due to patient report of fatigue. Emphasis on proper L knee alignment throughout full pedal stroke. Increased L hip ER observed, which patient reports may be due to scrotal edema, but tight hip ERs likely contributing as well. Patient ambulating ~60ft back to his room with HW, L AFO and CGA. Intermittent L  knee buckling, caught by AFO, noted. Patient requesting to return to bed due to fatigue. Bed alarm on, call light within reach.   Therapy Documentation Precautions:  Precautions Precautions: Fall Precaution Comments: L hemi UE> LE, L AFO, mild dysarthria Restrictions Weight Bearing Restrictions: No    Therapy/Group: Individual Therapy  Elizebeth Koller, PT, DPT, CBIS  10/01/2020, 7:41 AM

## 2020-10-01 NOTE — Progress Notes (Signed)
Occupational Therapy Weekly Progress Note  Patient Details  Name: Darryl Sullivan MRN: [id number] Date of Birth: 03/29/1966  Beginning of progress report period: September 23, 2020 End of progress report period: October 01, 2020    Patient has met 2 of 3 short term goals.  Pt cont to make steady progress with OT at this time. Pt has demonstrated improved activity tolerance, dynamic standing balance, and improved carryover of hemi-techniques to perform functional transfers with CGA with use of RW/HW, UB ADL with S, and LB ADL with S to CGA. Cont to req significant A to don L shoe/AFO and LUE cont to remain at [place] level II-III and hand at level I to II. Anticipate pt will req S and CGA for functional mobility in home environment.  Patient continues to demonstrate the following deficits: muscle weakness, decreased cardiorespiratoy endurance, impaired timing and sequencing, abnormal tone, unbalanced muscle activation, motor apraxia, ataxia and decreased coordination, decreased awareness, decreased problem solving and decreased safety awareness and decreased standing balance and decreased balance strategies and therefore will continue to benefit from skilled OT intervention to enhance overall performance with BADL, iADL and Reduce care partner burden.  Patient progressing toward long term goals..  Continue plan of care.  OT Short Term Goals Week 3:  OT Short Term Goal 1 (Week 3): STG = LTG 2/2 ELOS  Therapy Documentation Precautions:  Precautions Precautions: Fall Precaution Comments: L hemi UE> LE, L AFO, mild dysarthria Restrictions Weight Bearing Restrictions: No  Volanda Napoleon MS, OTR/L  10/01/2020, 3:04 PM

## 2020-10-02 NOTE — Progress Notes (Signed)
Occupational Therapy Session Note  Patient Details  Name: Darryl Sullivan MRN: [id number] Date of Birth: 1965-07-09  Today's Date: 10/02/2020 OT Group Time: 1100-1200 OT Group Time Calculation (min): 60 min  Skilled Therapeutic Interventions/Progress Updates:    Pt engaged in therapeutic w/c level dance group focusing on patient choice, UE/LE strengthening, salience, activity tolerance, and social participation. Pt was guided through various dance-based exercises involving UEs/LEs and trunk. All music was selected by group members. Emphasis placed on Lt NMR. Pt participated well during group, verbal/tactile cues for increasing Lt sided movement/attention. Visual cues for implementing active assist ROM exercises to include his affected Lt UE. Active assist from therapist to include Lt UE during claps and arm rolls. At end of session, RT escorted pt back to his room.     Therapy Documentation Precautions:  Precautions Precautions: Fall Precaution Comments: L hemi UE> LE, L AFO, mild dysarthria Restrictions Weight Bearing Restrictions: No Vital Signs: Therapy Vitals Temp: 98.9 F (37.2 C) Temp Source: Oral Pulse Rate: 65 Resp: 18 BP: (!) 146/87 Patient Position (if appropriate): Sitting Oxygen Therapy SpO2: 100 % O2 Device: Room Air Pain: no s/s pain during tx   ADL: ADL Eating: Set up Where Assessed-Eating: Wheelchair Grooming: Supervision/safety Where Assessed-Grooming: Sitting at [employer] Upper Body Bathing: Minimal assistance Where Assessed-Upper Body Bathing: Shower Lower Body Bathing: Moderate cueing Where Assessed-Lower Body Bathing: Shower Upper Body Dressing: Maximal assistance Where Assessed-Upper Body Dressing: Sitting at sink Lower Body Dressing: Maximal assistance Where Assessed-Lower Body Dressing: Sitting at sink Toileting: Moderate assistance Where Assessed-Toileting: Bedside Commode Toilet Transfer: Moderate assistance Toilet Transfer Method:  Stand pivot Acupuncturist: Psychiatric nurse: Dependent (stedy) Walk-In Shower Equipment: Emergency planning/management officer      Therapy/Group: Group Therapy  Abundio Miu 10/02/2020, 3:58 PM

## 2020-10-02 NOTE — Progress Notes (Signed)
Physical Therapy Session Note  Patient Details  Name: Darryl Sullivan MRN: [id number] Date of Birth: 02-20-1966  Today's Date: 10/02/2020 PT Individual Time: 1355:  - 1500, 65 min     Short Term Goals: Week 2:  PT Short Term Goal 1 (Week 2): Pt will ambulate 146ft wth min assist and LRAD PT Short Term Goal 2 (Week 2): Pt will transfer to and from [facility] with CGA and LRAd PT Short Term Goal 3 (Week 2): Pt will propell WC >140ft with supervision assist      Skilled Therapeutic Interventions/Progress Updates:  Pt seated in wc; LAFO already donned.  He denied pain, but stated that he needed to use toilet for BM.    wc propulsion into BR with mod assist over slight incline, using hemi technique.  Pt scooted forward reciprocally x 3 for better position before standing.  Sit> stand to RW with CGA.  PT placed pt's L hand into splint on RW.  Toilet transfer with min assist with mod cues for foot placement for safety during turn.  Pt continent of B and B.  Pt performed peri care with mod cues and set-up, in sitting.  PT insured cleanliness with wet washcloth.  Hand washing and oral care at sink with set up and cues for using L hand during hand washing.   neuromuscular re-education via wt bearing, demo, multimodal cues for reciprocal scooting in sitting;  trunk shortening/contralateral lengthening during reaching activities with R/L hands.  L lateral leans onto wedge >< midline sitting.    Gait training with HW on level tile, x 40' with CG/min assist.  Distance limited by L shin pain due to AFO.  Skin check negative.    Stand pivot with HW to bed, CGA. Sit> supine with supervision, slowly.  At end of session, pt in bed with needs at hand and bed alarm set.      Therapy Documentation Precautions:  Precautions Precautions: Fall Precaution Comments: L hemi UE> LE, L AFO, mild dysarthria Restrictions Weight Bearing Restrictions: No       Therapy/Group: Individual  Therapy  Manan Olmo 10/02/2020, 12:34 PM

## 2020-10-03 LAB — BASIC METABOLIC PANEL
Anion gap: 10 (ref 5–15)
BUN: 32 mg/dL — ABNORMAL HIGH (ref 6–20)
CO2: 28 mmol/L (ref 22–32)
Calcium: 10.1 mg/dL (ref 8.9–10.3)
Chloride: 101 mmol/L (ref 98–111)
Creatinine, Ser: 1.44 mg/dL — ABNORMAL HIGH (ref 0.61–1.24)
GFR, Estimated: 57 mL/min — ABNORMAL LOW (ref >60)
Glucose, Bld: 109 mg/dL — ABNORMAL HIGH (ref 70–99)
Potassium: 3.8 mmol/L (ref 3.5–5.1)
Sodium: 139 mmol/L (ref 135–145)

## 2020-10-03 LAB — CBC
HCT: 43.2 % (ref 39.0–52.0)
Hemoglobin: 13.5 g/dL (ref 13.0–17.0)
MCH: 28.1 pg (ref 26.0–34.0)
MCHC: 31.3 g/dL (ref 30.0–36.0)
MCV: 90 fL (ref 80.0–100.0)
Platelets: 247 10*3/uL (ref 150–400)
RBC: 4.8 MIL/uL (ref 4.22–5.81)
RDW: 11 % — ABNORMAL LOW (ref 11.5–15.5)
WBC: 7.8 10*3/uL (ref 4.0–10.5)
nRBC: 0 % (ref 0.0–0.2)

## 2020-10-03 NOTE — Progress Notes (Signed)
Occupational Therapy Session Note  Patient Details  Name: Darryl Sullivan MRN: [id number] Date of Birth: August 27, 1965  Today's Date: 10/03/2020 OT Individual Time: 1500-1600 OT Individual Time Calculation (min): 60 min    Short Term Goals: Week 3:  OT Short Term Goal 1 (Week 3): STG = LTG 2/2 ELOS  Skilled Therapeutic Interventions/Progress Updates:    Pt received in wc ready for therapy. Pt did not need to toilet but was agreeable to walking to bathroom with hemiwalker to simulate clean up after a BM.  Pt does best in a standing position where his squats slightly. He is able to balance without UE support to use his R hand.  CGA transfers and S sit to stand.   Transported to gym to transfer to mat to work on [place] with a/arom, wt bearing, trunk on arm rotation (increased bicep tone), hand over hand A with active grasping.  Showed pt how to use hemiwalker in L hand as a tool to work on AROM of sh flex/ext.   Pt participated well.  Returned to room and opted to rest in bed. In bed with alarm set and all needs met.   Therapy Documentation Precautions:  Precautions Precautions: Fall Precaution Comments: L hemi UE> LE, L AFO, mild dysarthria Restrictions Weight Bearing Restrictions: No  Pain: Pain Assessment Pain Score: 0-No pain     Therapy/Group: Individual Therapy  [place] 10/03/2020, 4:50 PM

## 2020-10-03 NOTE — Progress Notes (Signed)
Physical Therapy Session Note  Patient Details  Name: Darryl Sullivan MRN: [id number] Date of Birth: 1966/04/15  Today's Date: 10/03/2020 PT Individual Time: 0901-1005 PT Individual Time Calculation (min): 64 min   Short Term Goals: Week 3:  STG = LTG d/t ELOS     Skilled Therapeutic Interventions/Progress Updates:  Patient supine in bed on entrance to room. Patient alert and agreeable to PT session. Patient denied pain during session but did relate high pain level while ambulating with therapy the previous day with significant pain at shin along the shinplate of the   Therapeutic Activity: Bed Mobility: Patient performed supine <> sit with supervision and extra time using bedrail from flat mattress positioning. Discussed that pt will need bedrail at bedside of home mattress for assist with moving around in bed.  VC/ tc required for technique. Transfers: Patient performed STS and SPVT transfers supervision to/ from [facility] with focus on no UE use except on pt's thigh. Provided verbal cues for technique.  Gait Training:  Donned AFO for ambulation with foam padding added to shin plate for comfort. Patient amb 100' x2 using HW with close supervision and w/c follow for fatigue. Demonstrated good foot clearance with LLE and vc provided for level gaze, upright posture, flat foot strike.  Neuromuscular Re-ed: NMR facilitated during session with focus on seated and standing LLE muscle facilitation. Pt guided in seated LLE AROM DF/ PF, AAROM progressing to AROM INV/ EV, seated AROM LLE march to max range with no compensatory movements. Standing LLE march to max range and attempt to add 2nd muscle activation with paired DF and/ or eversion. Hip flexion breaks down with attempt to add 2nd muscle activation. NMR performed for improvements in motor control and coordination, balance, sequencing, judgement, and self confidence/ efficacy in performing all aspects of mobility at highest level of independence.   At  end of session, patient seated upright in w/c with AFO doffed, brakes locked, belt alarm set, and all needs within reach.  Therapy Documentation Precautions:  Precautions Precautions: Fall Precaution Comments: L hemi UE> LE, L AFO, mild dysarthria Restrictions Weight Bearing Restrictions: No   Therapy/Group: Individual Therapy  Loel Dubonnet PT, DPT 10/03/2020, 5:49 PM

## 2020-10-03 NOTE — Progress Notes (Signed)
Patient ID: Darryl Sullivan, male   DOB: 1965-11-15, 55 y.o.   MRN: [id number]  Pt referral faxed to [facility] [place], [place] [phone]

## 2020-10-03 NOTE — Progress Notes (Signed)
PROGRESS NOTE   Subjective/Complaints:  No issues overnite, PT working on L AFO fit, pt plans to go to trailer home with nephew  ROS: Patient denies CP SOB, N/V/D    Objective:   No results found. Recent Labs    10/03/20 0447  WBC 7.8  HGB 13.5  HCT 43.2  PLT 247   Recent Labs    10/03/20 0447  NA 139  K 3.8  CL 101  CO2 28  GLUCOSE 109*  BUN 32*  CREATININE 1.44*  CALCIUM 10.1    Intake/Output Summary (Last 24 hours) at 10/03/2020 4680 Last data filed at 10/02/2020 1955 Gross per 24 hour  Intake 460 ml  Output 775 ml  Net -315 ml        Physical Exam: Vital Signs Blood pressure 138/73, pulse 62, temperature 97.9 F (36.6 C), temperature source Oral, resp. rate 16, height 5\' 7"  (1.702 m), weight 100.3 kg, SpO2 100 %.   General: No acute distress Mood and affect are appropriate Heart: Regular rate and rhythm no rubs murmurs or extra sounds Lungs: Clear to auscultation, breathing unlabored, no rales or wheezes Abdomen: Positive bowel sounds, soft nontender to palpation, nondistended Extremities: No clubbing, cyanosis, or edema Skin: No evidence of breakdown, no evidence of rash Neurologic:  motor strength is 5/5 in right , 3- Left  deltoid, bicep, tricep, 5/5 RIght and 2- left grip, 5/5 R and 3- Left hip flexor, knee extensors, 0 ankle dorsiflexor and trace plantar flexor Sensory exam normal sensation to light touch and proprioception in bilateral upper and lower extremities  Musculoskeletal: Full range of motion in all 4 extremities. No joint swelling, No pain with L shoulder AROM   Assessment/Plan: 1. Functional deficits which require 3+ hours per day of interdisciplinary therapy in a comprehensive inpatient rehab setting.  Physiatrist is providing close team supervision and 24 hour management of active medical problems listed below.  Physiatrist and rehab team continue to assess barriers to  discharge/monitor patient progress toward functional and medical goals  Care Tool:  Bathing    Body parts bathed by patient: Left upper leg,Right upper leg,Front perineal area,Abdomen,Chest,Left arm,Face,Buttocks,Right lower leg,Left lower leg   Body parts bathed by helper: Right arm     Bathing assist Assist Level: Minimal Assistance - Patient > 75%     Upper Body Dressing/Undressing Upper body dressing   What is the patient wearing?: Pull over shirt    Upper body assist Assist Level: Supervision/Verbal cueing    Lower Body Dressing/Undressing Lower body dressing      What is the patient wearing?: Pants     Lower body assist Assist for lower body dressing: Supervision/Verbal cueing     Toileting Toileting    Toileting assist Assist for toileting: Contact Guard/Touching assist     Transfers Chair/bed transfer  Transfers assist     Chair/bed transfer assist level: Contact Guard/Touching assist     Locomotion Ambulation   Ambulation assist      Assist level: Minimal Assistance - Patient > 75% Assistive device: Walker-hemi Max distance: 40   Walk 10 feet activity   Assist     Assist level: Minimal Assistance - Patient >  75% Assistive device: Walker-hemi   Walk 50 feet activity   Assist Walk 50 feet with 2 turns activity did not occur: Safety/medical concerns  Assist level: Contact Guard/Touching assist Assistive device: Walker-hemi    Walk 150 feet activity   Assist Walk 150 feet activity did not occur: Safety/medical concerns         Walk 10 feet on uneven surface  activity   Assist Walk 10 feet on uneven surfaces activity did not occur: Safety/medical concerns   Assist level: Moderate Assistance - Patient - 50 - 74% Assistive device: Photographer Will patient use wheelchair at discharge?: Yes Type of Wheelchair: Manual Wheelchair activity did not occur: Safety/medical concerns  Wheelchair  assist level: Contact Guard/Touching assist,Minimal Assistance - Patient > 75%      Wheelchair 50 feet with 2 turns activity    Assist    Wheelchair 50 feet with 2 turns activity did not occur: Safety/medical concerns   Assist Level: Minimal Assistance - Patient > 75%,Contact Guard/Touching assist (min A with fatigue)   Wheelchair 150 feet activity     Assist  Wheelchair 150 feet activity did not occur: Safety/medical concerns       Blood pressure 138/73, pulse 62, temperature 97.9 F (36.6 C), temperature source Oral, resp. rate 16, height 5\' 7"  (1.702 m), weight 100.3 kg, SpO2 100 %.  Medical Problem List and Plan: 1.  Left hemiparesis, deficits with mobility, self-care secondary to right basal ganglia infarct with white matter changes with left basal ganglia punctate hemorrhage post TPA             -patient may shower             -ELOS/Goals: 4/29/supervision/min a             -Continue CIR therapies including PT, OT, and SLP - 2.  Impaired mobility -DVT/anticoagulation:  Pharmaceutical: Continue Lovenox             -antiplatelet therapy: Low dose ASA.  3. Pain Management: Continue Voltaren gel for left shoulder subluxation.              Moderate increased exertion 4. Mood: LCSW to follow for evaluation and support.              -antipsychotic agents: N/A 5. Neuropsych: This patient is capable of making decisions on his own behalf. 6. Skin/Wound Care: Routine pressure relief measures 7. Fluids/Electrolytes/Nutrition: Monitor I/Os. Encourage fluid intake.  8. HTN: Monitor BP tid--continue HCTZ, Norvasc, and metoprolol              Vitals:   10/02/20 1951 10/03/20 0438  BP: 125/87 138/73  Pulse: 75 62  Resp: 18 16  Temp: 98.5 F (36.9 C) 97.9 F (36.6 C)  SpO2: 98% 100%  4/25 bp controlled 9. CKD III: Will monitor renal status with serial checks             --avoid hypoperfusion             --BUN/SCr 21/1.31 at admission-->34/1.42. now 57 and 2.17, IVF at  noc             4/11: 1.53 on 4/11,at baseline off IVF 1.51 on 4/18 10. Dyslipidemia: Lipitor 11. Impaired fasting glucose:  Hgb A1c-4.7- normal             -- Elevated fasting BS likely due to stress.will not need CBG monitoring long term  Moderate increased mobility 12. Tobacco abuse             Counsel 13. Hypokalemia: K+ 3.2, supplement with BID today , repeat 4/25 3.8 cont KCL BID   14.  Left shoulder subacromial impingement syndrome-   subacromial injection Lshoulder was helpful on 4/20   LOS: 19 days A FACE TO FACE EVALUATION WAS PERFORMED  Erick Colace 10/03/2020, 9:38 AM

## 2020-10-03 NOTE — Progress Notes (Addendum)
Patient ID: Darryl Sullivan, male   DOB: 07-27-1965, 55 y.o.   MRN: [id number]   Pt referral faxed to [facility].   *Pt declined due to high capacity.  [place], [place] [phone]

## 2020-10-03 NOTE — Progress Notes (Signed)
Occupational Therapy Session Note  Patient Details  Name: Darryl Sullivan MRN: [id number] Date of Birth: 04-Jan-1966  Today's Date: 10/03/2020 OT Individual Time: 1300-1355 OT Individual Time Calculation (min): 55 min    Short Term Goals: Week 3:  OT Short Term Goal 1 (Week 3): STG = LTG 2/2 ELOS  Skilled Therapeutic Interventions/Progress Updates:    Pt resting in w/c upon arrival with RN present. Pt transitioned to gym and transfers to [facility] for stand pivot transfer. OT intervention with focus on LUE NMR and functional task to increase functional use of LUE. See below. Pt with trace finger flexion and wrist flexion/extension. WB through LUE when reaching with RUE across midline to facilitate increased WB. Pt with active elbow flexion with increased tone inhibiting elbow extension. Pt demonstrates difficulty with isolation of shoulder flexion and elbow extension and uses compensatory techniques for reaching tasks. Transfer back to w/c with CGA. Pt returned to room and remained in w/c with all needs within reach and belt alarm activated.   Therapy Documentation Precautions:  Precautions Precautions: Fall Precaution Comments: L hemi UE> LE, L AFO, mild dysarthria Restrictions Weight Bearing Restrictions: No    Pain:  Pt c/o Lt shoulder pain with PROM >90; stretching   Other Treatments: Treatments Neuromuscular Facilitation: Left;Upper Extremity;Activity to increase coordination;Forced use;Activity to increase motor control;Activity to increase sustained activation Weight Bearing Technique Weight Bearing Technique: Yes LUE Weight Bearing Technique: Forearm seated;Extended arm seated   Therapy/Group: Individual Therapy  Rich Brave 10/03/2020, 2:40 PM

## 2020-10-04 NOTE — Progress Notes (Signed)
Physical Therapy Session Note  Patient Details  Name: Darryl Sullivan MRN: [id number] Date of Birth: October 03, 1965  Today's Date: 10/04/2020 PT Individual Time: 1005-1106 and 1300-1402 PT Individual Time Calculation (min): 61 min and 62 min  Short Term Goals: Week 2:  PT Short Term Goal 1 (Week 2): Pt will ambulate 166ft wth min assist and LRAD PT Short Term Goal 2 (Week 2): Pt will transfer to and from [facility] with CGA and LRAd PT Short Term Goal 3 (Week 2): Pt will propell WC >122ft with supervision assist Week 3:   STG = LTG d/t ELOS  Skilled Therapeutic Interventions/Progress Updates:  Session 1: Patient seated upright in w/c on entrance to room. Pt's dtr present for family education. Patient alert and agreeable to PT session. Patient denied pain during session.  Therapeutic Activity: Transfers: Patient performed STS and SPVT transfers throughout session with supervision. Continues to require intermittent verbal cues for initial foot placement and forward lean in order to complete STS on first attempt.  Furniture transfer performed to low couch seat height. SPVT with no AD and short pivot stepping with CGA. Pt and dtr guided in need to scoot to edge of seat for improved foot positioning and use of RUE to armrest to assist with power up of rise to stand from lower height. Pt performs with supervision despite low self efficacy re: ability to stand from lower seat height.  Gait Training:  Patient ambulated 180 feet using HW with supervision/ CGA. Demonstrated slow pace, low step height on LLE with continued focus required for good foot placement. Provided vc/ tc for upright posture, level gaze, and increasing L knee/ hip flexion in swing-through for increased step height.  Training with dtr on stair mobility with pt. Demonstrated dtr positioning always one step below pt with hands on pt's hips to control balance. Pt able to ascend four 6" steps using RHR with vc for increasing LLE hip/ knee flexion  to clear step as well as L knee extension during stance phaseand completed with CGA. Descending steps with RUE to hand rail and leading with LLE. VC required for forward hand placement to handrail and clearing LLE from step. Completes to [facility] at bottom of steps. Dtr comfortable with positioning.  Wheelchair Mobility:  Patient propelled wheelchair 75 feet using RUE and RLE with slow speed and supervision. Provided vc for technique.  Patient seated in w/c at end of session with brakes locked, belt alarm set, and all needs within reach. Dtr present.   Session 2:  Patient seated in w/c on entrance to room. Patient alert and agreeable to PT session. Patient denied pain during session with improvement in shin pain with addition of abdominal pad to shinplate. Msg sent to orthotist re: need for additional padding and/ or adjustment to AFO for improved comfort.   Provided pt with 18" w/c to assess for fit. Pt states improved fit and prefers hemi-height of w/c. Order entered to SW for 18" w/c for pt on d/c.   Therapeutic Activity: Transfers: Patient performed STS and SPVT transfers with focus on no UE use and equal LE push to stand. Provided verbal cues for technique throughout session.   Gait Training:  Patient ambulated 100 feet over uneven paved surface using HW with supervision/ CGA. Pt guided in amb down/ up ramped, paved surface covering 75' in each direction. CGA provided in slow descent with pt able to control speed and demo good foot clearance with careful gaze to foot placement on uneven surface.  Provided vc/ tc for maintaining control of speed and increasing LLE step height. After seated rest break, pt able to ascend ramped surface with good pace and LLE foot clearance completing distance in less time than descent with overall supervision/ CGA.  Patient seated EOB at end of session with brakes locked, no alarm set, NT notified as to pt's position and need for toileting. All needs within  reach.  Therapy Documentation Precautions:  Precautions Precautions: Fall Precaution Comments: L hemi UE> LE, L AFO, mild dysarthria Restrictions Weight Bearing Restrictions: No  Therapy/Group: Individual Therapy  Loel Dubonnet PT, DPT 10/04/2020, 6:57 PM

## 2020-10-04 NOTE — Progress Notes (Signed)
Patient ID: Darryl Sullivan, male   DOB: 1966-05-27, 55 y.o.   MRN: [id number]   Hemi Walker ordered through [employer] . Sw awaiting pt WC size.  [place], [place] [phone]

## 2020-10-04 NOTE — Progress Notes (Signed)
Occupational Therapy Session Note  Patient Details  Name: Darryl Sullivan MRN: [id number] Date of Birth: [birth year]  Today's Date: 10/04/2020 OT Individual Time: 9924-2683 OT Individual Time Calculation (min): 56 min    Short Term Goals: Week 3:  OT Short Term Goal 1 (Week 3): STG = LTG 2/2 ELOS  Skilled Therapeutic Interventions/Progress Updates:    Pt received semi-reclined in bed, c/o mild L shoulder soreness but agreeable to therapy. Family not present for start of scheduled family education. Came to sitting EOB distant S and use of bed features, req A to remove covers. Incontinent of b/b, able to doff/donn brief with use of bed rail for STS and close S. Performed pericare in standing with close S. Donned new pants and changed shirts close S. Discussed cont rec for S during functional mobility, fall prevention/fall recovery, and rec to cont timed toileting at home and purchasing adult briefs 2/2 incontinence. Pt verbalized understanding. Donned B shoes/L AFO max A. Stood to complete oral care with hemi-walker close S. Daughter then present for family education; review pt performance re dressing, self-feeding, oral care, functional transfers, and rec DME/equipment. Reviewed don/doffing of L AFO. Pt simulated ambulatory TTB transfer CGA. Reviewed pros/cons of installing suction vs professionally installed grab bars. Pt states they will attempt to purchase/install them. Emphasized importance of S to CGA for all functional mobility and LUE management. Daughter verbalized understanding. W/c transport back to room and pt left in w/c with daughter present, call bell in reach, and all immediate needs met. Awaiting following PT appointment.    Therapy Documentation Precautions:  Precautions Precautions: Fall Precaution Comments: L hemi UE> LE, L AFO, mild dysarthria Restrictions Weight Bearing Restrictions: No Pain:  denies ADL: See Care Tool for more details.   Therapy/Group: Individual  Therapy  Volanda Napoleon MS, OTR/L  10/04/2020, 12:46 PM

## 2020-10-04 NOTE — Progress Notes (Signed)
PROGRESS NOTE   Subjective/Complaints: Pt states daughter and ex wife will be here today  Pt states that he can transfer without physical assist, still has some Left shoul;der pain with movement but not at rest  Labs reviewed   ROS: Patient denies CP SOB, N/V/D    Objective:   No results found. Recent Labs    10/03/20 0447  WBC 7.8  HGB 13.5  HCT 43.2  PLT 247   Recent Labs    10/03/20 0447  NA 139  K 3.8  CL 101  CO2 28  GLUCOSE 109*  BUN 32*  CREATININE 1.44*  CALCIUM 10.1    Intake/Output Summary (Last 24 hours) at 10/04/2020 0819 Last data filed at 10/04/2020 0332 Gross per 24 hour  Intake 240 ml  Output 1325 ml  Net -1085 ml        Physical Exam: Vital Signs Blood pressure 126/82, pulse 76, temperature 98 F (36.7 C), resp. rate 17, height 5\' 7"  (1.702 m), weight 100.3 kg, SpO2 98 %.  General: No acute distress Mood and affect are appropriate Heart: Regular rate and rhythm no rubs murmurs or extra sounds Lungs: Clear to auscultation, breathing unlabored, no rales or wheezes Abdomen: Positive bowel sounds, soft nontender to palpation, nondistended Extremities: No clubbing, cyanosis, or edema Skin: No evidence of breakdown, no evidence of rash Neurologic:  motor strength is 5/5 in right , 3- Left  deltoid, bicep, tricep, 5/5 RIght and 2- left grip, 5/5 R and 3- Left hip flexor, knee extensors, 0 ankle dorsiflexor and trace plantar flexor Sensory exam normal sensation to light touch and proprioception in bilateral upper and lower extremities  Musculoskeletal: Full range of motion in all 4 extremities. No joint swelling, No pain with L shoulder AROM   Assessment/Plan: 1. Functional deficits which require 3+ hours per day of interdisciplinary therapy in a comprehensive inpatient rehab setting.  Physiatrist is providing close team supervision and 24 hour management of active medical problems listed  below.  Physiatrist and rehab team continue to assess barriers to discharge/monitor patient progress toward functional and medical goals  Care Tool:  Bathing    Body parts bathed by patient: Left upper leg,Right upper leg,Front perineal area,Abdomen,Chest,Left arm,Face,Buttocks,Right lower leg,Left lower leg   Body parts bathed by helper: Right arm     Bathing assist Assist Level: Minimal Assistance - Patient > 75%     Upper Body Dressing/Undressing Upper body dressing   What is the patient wearing?: Pull over shirt    Upper body assist Assist Level: Supervision/Verbal cueing    Lower Body Dressing/Undressing Lower body dressing      What is the patient wearing?: Pants     Lower body assist Assist for lower body dressing: Supervision/Verbal cueing     Toileting Toileting    Toileting assist Assist for toileting: Contact Guard/Touching assist     Transfers Chair/bed transfer  Transfers assist     Chair/bed transfer assist level: Contact Guard/Touching assist     Locomotion Ambulation   Ambulation assist      Assist level: Minimal Assistance - Patient > 75% Assistive device: Walker-hemi Max distance: 40   Walk 10 feet activity  Assist     Assist level: Minimal Assistance - Patient > 75% Assistive device: Walker-hemi   Walk 50 feet activity   Assist Walk 50 feet with 2 turns activity did not occur: Safety/medical concerns  Assist level: Contact Guard/Touching assist Assistive device: Walker-hemi    Walk 150 feet activity   Assist Walk 150 feet activity did not occur: Safety/medical concerns         Walk 10 feet on uneven surface  activity   Assist Walk 10 feet on uneven surfaces activity did not occur: Safety/medical concerns   Assist level: Moderate Assistance - Patient - 50 - 74% Assistive device: Photographer Will patient use wheelchair at discharge?: Yes Type of Wheelchair:  Manual Wheelchair activity did not occur: Safety/medical concerns  Wheelchair assist level: Contact Guard/Touching assist,Minimal Assistance - Patient > 75%      Wheelchair 50 feet with 2 turns activity    Assist    Wheelchair 50 feet with 2 turns activity did not occur: Safety/medical concerns   Assist Level: Minimal Assistance - Patient > 75%,Contact Guard/Touching assist (min A with fatigue)   Wheelchair 150 feet activity     Assist  Wheelchair 150 feet activity did not occur: Safety/medical concerns       Blood pressure 126/82, pulse 76, temperature 98 F (36.7 C), resp. rate 17, height 5\' 7"  (1.702 m), weight 100.3 kg, SpO2 98 %.  Medical Problem List and Plan: 1.  Left hemiparesis, deficits with mobility, self-care secondary to right basal ganglia infarct with white matter changes with left basal ganglia punctate hemorrhage post TPA             -patient may shower             -ELOS/Goals: 4/29/supervision/min a             -Continue CIR therapies including PT, OT, and SLP -team conf in am  2.  Impaired mobility -DVT/anticoagulation:  Pharmaceutical: Continue Lovenox             -antiplatelet therapy: Low dose ASA.  3. Pain Management: Continue Voltaren gel for left shoulder subluxation.              Moderate increased exertion 4. Mood: LCSW to follow for evaluation and support.              -antipsychotic agents: N/A 5. Neuropsych: This patient is capable of making decisions on his own behalf. 6. Skin/Wound Care: Routine pressure relief measures 7. Fluids/Electrolytes/Nutrition: Monitor I/Os. Encourage fluid intake.  8. HTN: Monitor BP tid--continue HCTZ, Norvasc, and metoprolol              Vitals:   10/03/20 1941 10/04/20 0425  BP: (!) 153/83 126/82  Pulse: 83 76  Resp: 18 17  Temp: 98.3 F (36.8 C) 98 F (36.7 C)  SpO2: 100% 98%  4/26 bp controlled 9. CKD III: Will monitor renal status with serial checks             --avoid hypoperfusion              --BUN/SCr 21/1.31 at admission-->34/1.42. now 57 and 2.17, IVF at noc             4/11: 1.53 on 4/11,at baseline off IVF 1.51 on 4/18 10. Dyslipidemia: Lipitor 11. Impaired fasting glucose:  Hgb A1c-4.7- normal             -- Elevated fasting BS likely due  to stress.will not need CBG monitoring long term              Moderate increased mobility 12. Tobacco abuse             Counsel 13. Hypokalemia: K+ 3.2, supplement with BID today , repeat 4/25 3.8 cont KCL BID   14.  Left shoulder subacromial impingement syndrome-   subacromial injection Lshoulder was helpful on 4/20   LOS: 20 days A FACE TO FACE EVALUATION WAS PERFORMED  Erick Colace 10/04/2020, 8:19 AM

## 2020-10-04 NOTE — Progress Notes (Signed)
Patient ID: Darryl Sullivan, male   DOB: March 12, 1966, 55 y.o.   MRN: [id number]   SW informed pt has no HH coverage with insurance plan. SW will allow pt to decide if he would like to co-pay for [facility] at 100% or attend OP therapy with an 80% co-pay. Pt OP referral faxed to OP at [facility].  [place], [place] [phone]

## 2020-10-05 MED ORDER — MUSCLE RUB 10-15 % EX CREA
TOPICAL_CREAM | Freq: Two times a day (BID) | CUTANEOUS | Status: DC | PRN
  Filled 2020-10-05: qty 85

## 2020-10-05 NOTE — Progress Notes (Signed)
Physical Therapy Session Note  Patient Details  Name: Darryl Sullivan MRN: [id number] Date of Birth: [birth year]  Today's Date: 10/05/2020 PT Individual Time: [phone] [id number] PT Individual Time Calculation (min): 60 min  56 min   Short Term Goals: Week 2:  PT Short Term Goal 1 (Week 2): Pt will ambulate 160f wth min assist and LRAD PT Short Term Goal 2 (Week 2): [facility] PT Short Term Goal 3 (Week 2): Pt will propell WC >1570fwith supervision assist  Skilled Therapeutic Interventions/Progress Updates:  Session 1 Pt received supine in bed, RN present to administer medication. PT reutned in 15 minutes and pt  agreeable to PT. Supine>sit transfer without assist from slightly elevated position and moderate use of bed rail. Pt donning pants from EOB with min assist for navigate over grip socks. Sit<>stand to pull to waist without assist from PT. Pt then assisted to don Bil shoes and LAFO. Stand [facility] supervision assist from PT.   [facility] wth supervision assist x 4 steps with R rail to simulate home access. Cues for step to gait pattern and improved posture to reduce fall risk.   Dynamic gait trainingin parallel bars to perform side stepping R and L with BUE support on rail with assist to maintain grasp on the L. Pt performed forward/reverse 2 x 8 ft each and side stepping 2 x 8 ft bil. Instruction for posture and gluteal activation on the L intermittently.  Stepping over 1 inch cane with BUE support parallel bars x 10 bil with cues for increased step length/hieight and postural control on the L. Noted to have improved since last week.   PT donned elastic shoe laces to Bil shoes to allow improved independence with ADLs.  Gait training with HW x 4071fith supervision assist and elastic laces to assess fit. No LOB noted and adequate foot control in shoe.  WC mobility through  hall x [id number] cues or assist using hemi technique. Patient returned to room and [facility] call bell in reach and all needs met.     Session 2.  Pt received supine in bed and agreeable to PT. Supine>sit transfer with supervision assist for attention to the LLE. PT assisted pt to don shoes and AFO with mod assist with elastic laces in place. Gait training with HW and AFO x  60ft48fh supervision assist. Pt reports that orthotist placed wedge and shin pads in place and decreased pain on shin with standing. Dynamic balance training to perform weight shift R with corss body and lateral reach to force increased WB in the LLE. attempted forced WB with wrist extension on elevated mat table, but pain in fingers and wrist prevent full extension. PT performed AAROM into wrist/finger extension 4 x 10sec hold. Tenodesis stretch 6 x 20sec hold with increased ROM noted upon completion. Pt returned to room and performed stand pivot transfer to bed with supervision assist, HW and AFO. Sit>supine completed without assist, and left supine in bed with call bell in reach and all needs met.          Therapy Documentation Precautions:  Precautions Precautions: Fall Precaution Comments: L hemi UE> LE, L AFO, mild dysarthria Restrictions Weight Bearing Restrictions: No General: PT Amount of Missed Time (min): 15 Minutes PT Missed Treatment Reason: Nursing care Vital Signs:  Pain: Pain Assessment Pain Scale:  0-10 Pain Score: 0-No pain Mobility:   Locomotion :    Trunk/Postural Assessment :    Balance:   Exercises:   Other Treatments:      Therapy/Group: Individual Therapy  Lorie Phenix 10/05/2020, 9:15 AM

## 2020-10-05 NOTE — Progress Notes (Signed)
PROGRESS NOTE   Subjective/Complaints: No issues with PT, had family training yesterday , transfers without physical assist   ROS: Patient denies CP SOB, N/V/D    Objective:   No results found. Recent Labs    10/03/20 0447  WBC 7.8  HGB 13.5  HCT 43.2  PLT 247   Recent Labs    10/03/20 0447  NA 139  K 3.8  CL 101  CO2 28  GLUCOSE 109*  BUN 32*  CREATININE 1.44*  CALCIUM 10.1    Intake/Output Summary (Last 24 hours) at 10/05/2020 0912 Last data filed at 10/05/2020 0654 Gross per 24 hour  Intake 240 ml  Output 1320 ml  Net -1080 ml        Physical Exam: Vital Signs Blood pressure (!) 143/87, pulse 72, temperature 98.5 F (36.9 C), resp. rate 18, height _0  (1.702 m), weight 100.3 kg, SpO2 99 %.   General: No acute distress Mood and affect are appropriate Heart: Regular rate and rhythm no rubs murmurs or extra sounds Lungs: Clear to auscultation, breathing unlabored, no rales or wheezes Abdomen: Positive bowel sounds, soft nontender to palpation, nondistended Extremities: No clubbing, cyanosis, or edema Skin: No evidence of breakdown, no evidence of rash  Neurologic:  motor strength is 5/5 in right , 3- Left  deltoid, bicep, tricep, 5/5 RIght and 2- left grip, 5/5 R and 3- Left hip flexor, knee extensors, 0 ankle dorsiflexor and trace plantar flexor Sensory exam normal sensation to light touch and proprioception in bilateral upper and lower extremities  Musculoskeletal: Full range of motion in all 4 extremities. No joint swelling, No pain with L shoulder AROM   Assessment/Plan: 1. Functional deficits which require 3+ hours per day of interdisciplinary therapy in a comprehensive inpatient rehab setting.  Physiatrist is providing close team supervision and 24 hour management of active medical problems listed below.  Physiatrist and rehab team continue to assess barriers to discharge/monitor patient  progress toward functional and medical goals  Care Tool:  Bathing    Body parts bathed by patient: Front perineal area,Buttocks   Body parts bathed by helper: Right arm     Bathing assist Assist Level: Supervision/Verbal cueing     Upper Body Dressing/Undressing Upper body dressing   What is the patient wearing?: Pull over shirt    Upper body assist Assist Level: Supervision/Verbal cueing    Lower Body Dressing/Undressing Lower body dressing      What is the patient wearing?: Pants,Incontinence brief     Lower body assist Assist for lower body dressing: Supervision/Verbal cueing     Toileting Toileting    Toileting assist Assist for toileting: Contact Guard/Touching assist     Transfers Chair/bed transfer  Transfers assist     Chair/bed transfer assist level: Contact Guard/Touching assist     Locomotion Ambulation   Ambulation assist      Assist level: Minimal Assistance - Patient > 75% Assistive device: Walker-hemi Max distance: 40   Walk 10 feet activity   Assist     Assist level: Minimal Assistance - Patient > 75% Assistive device: Walker-hemi   Walk 50 feet activity   Assist Walk 50 feet with  2 turns activity did not occur: Safety/medical concerns  Assist level: Contact Guard/Touching assist Assistive device: Walker-hemi    Walk 150 feet activity   Assist Walk 150 feet activity did not occur: Safety/medical concerns         Walk 10 feet on uneven surface  activity   Assist Walk 10 feet on uneven surfaces activity did not occur: Safety/medical concerns   Assist level: Moderate Assistance - Patient - 50 - 74% Assistive device: Aeronautical engineer Will patient use wheelchair at discharge?: Yes Type of Wheelchair: Manual Wheelchair activity did not occur: Safety/medical concerns  Wheelchair assist level: Contact Guard/Touching assist,Minimal Assistance - Patient > 75%      Wheelchair 50 feet  with 2 turns activity    Assist    Wheelchair 50 feet with 2 turns activity did not occur: Safety/medical concerns   Assist Level: Minimal Assistance - Patient > 75%,Contact Guard/Touching assist (min A with fatigue)   Wheelchair 150 feet activity     Assist  Wheelchair 150 feet activity did not occur: Safety/medical concerns       Blood pressure (!) 143/87, pulse 72, temperature 98.5 F (36.9 C), resp. rate 18, height _0  (1.702 m), weight 100.3 kg, SpO2 99 %.  Medical Problem List and Plan: 1.  Left hemiparesis, deficits with mobility, self-care secondary to right basal ganglia infarct with white matter changes with left basal ganglia punctate hemorrhage post TPA             -patient may shower             -ELOS/Goals: 4/29/supervision/min a             -Continue CIR therapies including PT, OT, and SLP - Team conference today please see physician documentation under team conference tab, met with team  to discuss problems,progress, and goals. Formulized individual treatment plan based on medical history, underlying problem and comorbidities.  2.  Impaired mobility -DVT/anticoagulation:  Pharmaceutical: Continue Lovenox             -antiplatelet therapy: Low dose ASA.  3. Pain Management: Continue Voltaren gel for left shoulder subluxation.              Moderate increased exertion 4. Mood: LCSW to follow for evaluation and support.              -antipsychotic agents: N/A 5. Neuropsych: This patient is capable of making decisions on his own behalf. 6. Skin/Wound Care: Routine pressure relief measures 7. Fluids/Electrolytes/Nutrition: Monitor I/Os. Encourage fluid intake.  8. HTN: Monitor BP tid--continue HCTZ, Norvasc, and metoprolol              Vitals:   10/04/20 1942 10/05/20 0321  BP: (!) 144/83 (!) 143/87  Pulse: 70 72  Resp: 20 18  Temp: 98.1 F (36.7 C) 98.5 F (36.9 C)  SpO2: 100% 99%  4/26 bp controlled 9. CKD III: Will monitor renal status with serial  checks             --avoid hypoperfusion             --BUN/SCr 21/1.31 at admission-->34/1.42. now 58 and 2.17, IVF at noc             4/11: 1.53 on 4/11,at baseline off IVF 1.51 on 4/18 10. Dyslipidemia: Lipitor 11. Impaired fasting glucose:  Hgb A1c-4.7- normal             -- Elevated fasting BS  likely due to stress.will not need CBG monitoring long term              Moderate increased mobility 12. Tobacco abuse             Counsel 13. Hypokalemia: K+ 3.2, supplement with 69mq BID today , repeat 4/25 3.8 cont KCL 224m BID   14.  Left shoulder subacromial impingement syndrome-   subacromial injection Lshoulder was helpful on 4/20   LOS: 21 days A FACE TO FACardwell Shelonda Saxe 10/05/2020, 9:12 AM

## 2020-10-05 NOTE — Patient Care Conference (Signed)
Inpatient RehabilitationTeam Conference and Plan of Care Update Date: 10/05/2020   Time: 10:15 AM    Patient Name: Darryl Sullivan      Medical Record Number: [id number]  Date of Birth: April 27, 1966 Sex: Male         Room/Bed: 4W13C/4W13C-01 Payor Info: Payor: BLUE CROSS BLUE SHIELD / Plan: BCBS COMM PPO / Product Type: *No Product type* /    Admit Date/Time:  09/14/2020 12:36 PM  Primary Diagnosis:  Stroke of right basal ganglia [facility])  Hospital Problems: Principal Problem:   Stroke of right basal ganglia [facility])    Expected Discharge Date: Expected Discharge Date: 10/07/20  Team Members Present: Physician leading conference: Dr. Claudette Laws Care Coodinator Present: Chana Bode, RN, BSN, CRRN;Christina Walls, BSW Nurse Present: Chana Bode, RN PT Present: Grier Rocher, PT OT Present: Other (comment) Annye English, OT) SLP Present: Feliberto Gottron, SLP PPS Coordinator present : Edson Snowball, PT     Current Status/Progress Goal Weekly Team Focus  Bowel/Bladder   continent of bowel and bladder. LBM 4/26 few periods of incontinence.  Reamin continent and have no incontinent episodes  ass toileting needs qshift and PRN   Swallow/Nutrition/ Hydration             ADL's   S to set-up UBB/UBD, grooming, self-feeding; S LBD, max shoes/AFO, S to CGA toilet/shower transfer via stand-pivot with RW, Brunstrum III LUE, I to II hand, cont to complain of shoulder pain upon AROM  CGA transfers, S UB/LB ADL, anticipate should meet  LUE NMR, ADL/balance/functional transfer retraining, activity tolerance, family/pt/DME edu   Mobility   Bed mobility = Supervision/ Mod I with bedrail; transfers = Supervision requiring several attempts for power up; Gait = 80-100' using hemiwalker with overall CGA  overall CGA/ Min A for functional mobility  Continued work on standing balance, NMR and strengthening for LLE, improving quality of gait and transfers until d/c on 4/29   Communication              Safety/Cognition/ Behavioral Observations            Pain   No c/o pain  remain pain free  assess pain qshift and PRN   Skin   No skin impairments  Skin remains free of breakdown  assess skin qshift and PRN     Discharge Planning:  Family education complete. DME ordered through [employer]. Pt does not have HH coverage, OP referral faxed    Team Discussion: Shoulder pain addressed with injection and sports cream.   Patient on target to meet rehab goals: yes, currently supervision for upper body care and lower body dressing. Requires assist to don shoes and AFO. Supervision for step and ambulation and shower transfer with hemi walker  *See Care Plan and progress notes for long and short-term goals.   Revisions to Treatment Plan:  Regaining some activation in leg but needs AFO for discharge.  Teaching Needs: Family education completed,  Transfers, toileting, assistance for shoes/AFO, etc Secondary stroke risk management, dietary modifications, medications, etc  Current Barriers to Discharge: Decreased caregiver support and Home enviroment access/layout  Possible Resolutions to Barriers: HH or OP follow up recommended            Medical Summary Current Status: BUN Creat iproved, Left sholder pain mild improvement ,family training completed  Barriers to Discharge: Medical stability   Possible Resolutions to Barriers/Weekly Focus: D/C in 2days   Continued Need for Acute Rehabilitation Level of Care: The patient requires daily medical  management by a physician with specialized training in physical medicine and rehabilitation for the following reasons: Direction of a multidisciplinary physical rehabilitation program to maximize functional independence : Yes Medical management of patient stability for increased activity during participation in an intensive rehabilitation regime.: Yes Analysis of laboratory values and/or radiology reports with any subsequent need for  medication adjustment and/or medical intervention. : Yes   I attest that I was present, lead the team conference, and concur with the assessment and plan of the team.   Chana Bode B 10/05/2020, 4:03 PM

## 2020-10-05 NOTE — Progress Notes (Signed)
Patient ID: Darryl Sullivan, male   DOB: 1966/01/24, 55 y.o.   MRN: [id number]  Pt WC ordered through [employer].  [place], [place] [phone]

## 2020-10-05 NOTE — Progress Notes (Signed)
Occupational Therapy Session Note  Patient Details  Name: Darryl Sullivan MRN: [id number] Date of Birth: 03-07-1966  Today's Date: 10/05/2020 OT Individual Time: 0699-9672 OT Individual Time Calculation (min): 53 min    Short Term Goals: Week 3:  OT Short Term Goal 1 (Week 3): STG = LTG 2/2 ELOS  Skilled Therapeutic Interventions/Progress Updates:    Pt received seated in w/c, cont to c/o L shoulder pain but agreeable to therapy. Req to wash clothes, transported in w/c to washer 2/2 time management. Pt able to complete STS and amb to washer to place clothes with CGA + hemiwalker. Back in room. Amb into bathroom to complete TTB transfer CGA with hemi-walker. Doffed/donned shirt S, doffed/donned brief/pants S with use of grab bar/sink for STS. Noted incont of bladder in brief. Bathed full-body min A to bathe RUE, buttocks via lateral leans. Stood to complete oral care close S. Stand-pivot to bed close S with use of bed rail. Return to supine close S. Provided hand mit for L hand to facilitate UBB in future sessions.   Pt left semi-reclined in bed with bed alarm engaged, call bell in reach, and all immediate needs met.    Therapy Documentation Precautions:  Precautions Precautions: Fall Precaution Comments: L hemi UE> LE, L AFO, mild dysarthria Restrictions Weight Bearing Restrictions: No Pain:   see sessio note ADL: See Care Tool for more details. Therapy/Group: Individual Therapy  Volanda Napoleon MS, OTR/L  10/05/2020, 2:49 PM

## 2020-10-05 NOTE — Progress Notes (Signed)
Patient ID: Darryl Sullivan, male   DOB: 1965-12-04, 55 y.o.   MRN: [id number] Team Conference Report to Patient/Family  Team Conference discussion was reviewed with the patient and caregiver, including goals, any changes in plan of care and target discharge date.  Patient and caregiver express understanding and are in agreement.  The patient has a target discharge date of 10/07/20.  SW met with pt, called dtr at bedside and left VM. Pt excited to for d/c, no additional questions or concerns.   Dyanne Iha 10/05/2020, 12:20 PM

## 2020-10-06 NOTE — Progress Notes (Signed)
Physical Therapy Discharge Summary  Patient Details  Name: Darryl Sullivan MRN: [id number] Date of Birth: [birth year]  Today's Date: 10/06/2020 PT Individual Time: 5852-7782 AND [phone] PT Individual Time Calculation (min): 30 min and 70 min    Patient has met 11 of 11 long term goals due to improved activity tolerance, improved balance, improved postural control, increased strength, increased range of motion, ability to compensate for deficits, functional use of  left lower extremity, improved attention, improved awareness and improved coordination.  Patient to discharge at an ambulatory level Supervision.   Patient's care partner is independent to provide the necessary physical assistance at discharge.  Reasons goals not met: All PT goals met.   Recommendation:  Patient will benefit from ongoing skilled PT services in home health setting to continue to advance safe functional mobility, address ongoing impairments in balance, strength, coordination, safety, transfers, gait, and minimize fall risk.  Equipment: HW, WC.   Reasons for discharge: treatment goals met and discharge from hospital  Patient/family agrees with progress made and goals achieved: Yes   PT treatment:  Session 1.  Pt received supine in bed and agreeable to PT. Supine>sit transfer with supeivsion assist and cues for decreased use of bed rail as able. PT assisted pt to don shoes. Pt reports soild brief. Sit<>stand from EOB with supervision assist for PT to remove/don brief. PT instructed pt in Grad day assessment to measure progress toward goals. See below for details. Gait training with HW x 85 ft with supervision assist. Stair management training x 4 steps with supervision assist and cues for step to gait pattern. Car transfer training with min-CGA from PT for management of the LLE. Gait training over unlevel mulch x 7f with CGA andcues for AD management. [facility] and all needs met.    Session 2.  Pt received supine in bed and agreeable to PT. Supine>sit transfer with supervision assist for safety. PT assisted pt to don shoes EOB. Gait training to bathroom with supevriaion assist and HW. Pt able to void at toilet but noted to have been incontinent, requiring assist to don clean brief. Pt instructed pt in modified otago level A with min assist to facilitate knee extension on the LLE in stance phase. Cues for UE support on HW of sink as needed. Hand out provided. Pt returned to room and performed stand pivot transfer to bed with supervision assist. Sit>supine completed with supervision assist, and left supine in bed with call bell in reach and all needs met.        PT Discharge Precautions/Restrictions   fall    Pain   denies Vision/Perception  L inattention Sensation Sensation Light Touch: Appears Intact Central sensation comments: C/o mild tingling to L digits, able to appreciate LT. Hot/Cold: Appears Intact Proprioception: Impaired by gross assessment Stereognosis: Impaired by gross assessment Additional Comments: impaired LUE, RUE WFL Coordination Gross Motor Movements are Fluid and Coordinated: No Fine Motor Movements are Fluid and Coordinated: No Coordination and Movement Description: L hemi LUE>LLE Finger Nose Finger Test: unable to perform on L side Motor  Motor Motor: Hemiplegia Motor - Discharge Observations: L hemi LUE>LLE; improved from eval  Mobility Bed Mobility Bed Mobility: Supine to Sit;Rolling Right;Rolling Left;Sit to Supine Rolling Right: Supervision/verbal cueing Rolling Left: Supervision/Verbal cueing Supine to Sit: Supervision/Verbal cueing Sit to Supine: Supervision/Verbal cueing Transfers Transfers: Sit to Stand;Stand Pivot Transfers Sit to Stand: Supervision/Verbal cueing Stand to Sit:  Supervision/Verbal cueing Stand Pivot Transfers: Supervision/Verbal cueing Transfer (Assistive device):  Hemi-walker Locomotion  Gait Ambulation: Yes Gait Assistance: Supervision/Verbal cueing Gait Distance (Feet): 85 Feet Assistive device: Hemi-walker Gait Gait: Yes Gait Pattern: Impaired Gait Pattern: Step-through pattern;Decreased stance time - left;Left flexed knee in stance;Narrow base of support Stairs / Additional Locomotion Stairs: Yes Stairs Assistance: Supervision/Verbal cueing Stair Management Technique: One rail Right Number of Stairs: 4 Height of Stairs: 6 Wheelchair Mobility Wheelchair Mobility: Yes Wheelchair Assistance: Chartered loss adjuster: Right upper extremity;Right lower extremity Wheelchair Parts Management: Supervision/cueing Distance: 150  Trunk/Postural Assessment  Cervical Assessment Cervical Assessment: Exceptions to [facility] Thoracic Assessment Thoracic Assessment: Exceptions to [facility] Lumbar Assessment Lumbar Assessment: Within Functional Limits Postural Control Postural Control: Deficits on evaluation (mild delay on the L side)  Balance Balance Balance Assessed: Yes Static Sitting Balance Static Sitting - Balance Support: No upper extremity supported Static Sitting - Level of Assistance: 7: Independent Dynamic Sitting Balance Dynamic Sitting - Balance Support: No upper extremity supported Dynamic Sitting - Level of Assistance: 5: Stand by assistance;6: Modified independent (Device/Increase time) Static Standing Balance Static Standing - Balance Support: No upper extremity supported Static Standing - Level of Assistance: 5: Stand by assistance Dynamic Standing Balance Dynamic Standing - Balance Support: Right upper extremity supported;During functional activity Dynamic Standing - Level of Assistance: 5: Stand by assistance Extremity Assessment      RLE Assessment RLE Assessment: Within Functional Limits LLE Assessment LLE Assessment: Exceptions to [facility] LLE Strength Left Hip Flexion: 4-/5 Left Hip Extension: 3+/5 Left  Hip ABduction: 4-/5 Left Hip ADduction: 4/5 Left Knee Flexion: 3/5 Left Knee Extension: 4-/5 Left Ankle Dorsiflexion: 2/5 Left Ankle Plantar Flexion: 2/5    Lorie Phenix 10/06/2020, 5:58 PM

## 2020-10-06 NOTE — Discharge Summary (Signed)
Physician Discharge Summary  Patient ID: Darryl Sullivan MRN: [id number] DOB/AGE: 1965-10-30 55 y.o.  Admit date: 09/14/2020 Discharge date: 10/07/20  Discharge Diagnoses:  Principal Problem:   Stroke of right basal ganglia (HCC) Active Problems:   Stage 3 chronic kidney disease (HCC)   Essential hypertension   Impingement syndrome of left shoulder   Discharged Condition: stable   Significant Diagnostic Studies: Sullivan results found.  Labs:  Basic Metabolic Panel: BMP Latest Ref Rng & Units 10/03/2020 09/26/2020 09/23/2020  Glucose 70 - 99 mg/dL 867(Y) 195(K) 932(I)  BUN 6 - 20 mg/dL 71(I) 45(Y) 09(X)  Creatinine 0.61 - 1.24 mg/dL 8.33(A) 2.50(N) 3.97(Q)  Sodium 135 - 145 mmol/L 139 139 137  Potassium 3.5 - 5.1 mmol/L 3.8 3.7 3.7  Chloride 98 - 111 mmol/L 101 100 100  CO2 22 - 32 mmol/L 28 29 30   Calcium 8.9 - 10.3 mg/dL 73.4 9.8    CBC: CBC Latest Ref Rng & Units 10/03/2020 09/26/2020 09/19/2020  WBC 4.0 - 10.5 K/uL 7.8 5.7 7.0  Hemoglobin 13.0 - 17.0 g/dL 11/19/2020 79.0 24.0  Hematocrit 39.0 - 52.0 % 43.2 43.7 41.8  Platelets 150 - 400 K/uL 247 261 252    CBG: Sullivan results for input(s): GLUCAP in the last 168 hours.  Brief HPI:   Darryl Sullivan is a 55 y.o. male in relatively good health who was admitted on [facility] on 09/05/20 with left sided weakness and malignant HTN- BP 231/126. CTA head negative for LVO and he received tPA. UDS negative and he was started on Cleveprex and IV labetalol. Cleveprex. MRI brain done showing areas of ischemia in left temporal white matter and right basal ganglia. . Follow up CT head showed small left basal ganglia hemorrhage. Stroke felt to be due to small vessel disease and low dose ASA recommended as well as outpatient sleep study to rule out OSA.  He did have worsening of renal status and renal ultrasound revealed medical renal disease. Medications added and titrated for better BP control. He continued to be limited by tachycardia with activity as well  as LUE/LLE weakness. CIR was recommended due to functional decline.    Hospital Course: Darryl Sullivan was admitted to rehab 09/14/2020 for inpatient therapies to consist of PT and OT at least three hours five days a week. Past admission physiatrist, therapy team and rehab RN have worked together to provide customized collaborative inpatient rehab.  He has developed pain due to left shoulder subluxation and voltaren gel added with minimal relief. Left shoulder was injected with betamethasone on 04/20 to help with symptoms. His blood pressures were monitored on TID basis and has been controlled on current regimen. Acute on chronic renal failure has been monitored with serial checks and showed worsening therefore he was hydrated with IVF.  As renal status improved, fluid were d/c on 04/12 and renal status is currently stable. Kdur was added with resolution of hypokalemia.  Impaired fasting glucose felt to be due to stress as Hgb A1c- 4.7 and WNL. He has been educated on importance of compliance as well as tobacco cessation. He has progressed to supervision level    Rehab course: During patient's stay in rehab weekly team conferences were held to monitor patient's progress, set goals and discuss barriers to discharge. At admission, patient required mod to max assist with mobility and max assist with ADL tasks. He  has had improvement in activity tolerance, balance, postural control as well as ability to compensate for deficits.  He has had improvement in functional use LUE  and LLE as well as improvement in awareness . He is able co complete ADL tasks with supervision. He requires supervision for transfers and to ambulate 11' with HW. Family education was completed with daughter.    Disposition: Home  Diet:  Heart Healthy.   Special Instructions: 1. Sullivan driving or strenuous activity till cleared by MD. 2. Recommend BMET in 2-3 weeks to monitor for stability.   Allergies as of 10/07/2020      Reactions    Penicillins Hives   Tramadol Hives      Medication List    TAKE these medications   acetaminophen 325 MG tablet Commonly known as: TYLENOL Take 1-2 tablets (325-650 mg total) by mouth every 4 (four) hours as needed for mild pain. What changed:   how much to take  reasons to take this   amLODipine 10 MG tablet Commonly known as: NORVASC Take 1 tablet (10 mg total) by mouth daily.   aspirin 81 MG EC tablet Take 1 tablet (81 mg total) by mouth daily. Swallow whole.   atorvastatin 80 MG tablet Commonly known as: LIPITOR Take 1 tablet (80 mg total) by mouth daily. What changed:   medication strength  when to take this   diclofenac Sodium 1 % Gel Commonly known as: VOLTAREN Apply 2 g topically 4 (four) times daily as needed (left shoulder pain).   hydrochlorothiazide 25 MG tablet Commonly known as: HYDRODIURIL Take 1 tablet (25 mg total) by mouth daily.   metoprolol tartrate 25 MG tablet Commonly known as: LOPRESSOR Take 1 tablet (25 mg total) by mouth 2 (two) times daily.   Muscle Rub 10-15 % Crea Apply 1 application topically 2 (two) times daily as needed for muscle pain.   pantoprazole 40 MG tablet Commonly known as: PROTONIX Take 1 tablet (40 mg total) by mouth daily. What changed: when to take this   potassium chloride SA 20 MEQ tablet Commonly known as: KLOR-CON Take 1 tablet (20 mEq total) by mouth daily.       Follow-up Information    Kirsteins, Victorino Sparrow, MD Follow up.   Specialty: Physical Medicine and Rehabilitation Why: office will call you with follow up appointment Contact information: [address] [place] [address] [phone]        Smitty Cords, DO. Call on 10/31/2020.   Specialty: Family Medicine Why: 10 am for post hospital follow up  Contact information: [address] [place] [address] [phone]               Signed: Jacquelynn Cree 10/10/2020, 1:04 PM

## 2020-10-06 NOTE — Plan of Care (Signed)
  Problem: RH Balance Goal: LTG Patient will maintain dynamic sitting balance (PT) Description: LTG:  Patient will maintain dynamic sitting balance with assistance during mobility activities (PT) Outcome: Completed/Met Goal: LTG Patient will maintain dynamic standing balance (PT) Description: LTG:  Patient will maintain dynamic standing balance with assistance during mobility activities (PT) Outcome: Completed/Met   Problem: Sit to Stand Goal: LTG:  Patient will perform sit to stand with assistance level (PT) Description: LTG:  Patient will perform sit to stand with assistance level (PT) Outcome: Completed/Met   Problem: RH Bed Mobility Goal: LTG Patient will perform bed mobility with assist (PT) Description: LTG: Patient will perform bed mobility with assistance, with/without cues (PT). Outcome: Completed/Met   Problem: RH Bed to Chair Transfers Goal: LTG Patient will perform bed/chair transfers w/assist (PT) Description: LTG: Patient will perform bed to chair transfers with assistance (PT). Outcome: Completed/Met   Problem: RH Car Transfers Goal: LTG Patient will perform car transfers with assist (PT) Description: LTG: Patient will perform car transfers with assistance (PT). Outcome: Completed/Met   Problem: RH Ambulation Goal: LTG Patient will ambulate in controlled environment (PT) Description: LTG: Patient will ambulate in a controlled environment, # of feet with assistance (PT). Outcome: Completed/Met Goal: LTG Patient will ambulate in home environment (PT) Description: LTG: Patient will ambulate in home environment, # of feet with assistance (PT). Outcome: Completed/Met   Problem: RH Wheelchair Mobility Goal: LTG Patient will propel w/c in controlled environment (PT) Description: LTG: Patient will propel wheelchair in controlled environment, # of feet with assist (PT) Outcome: Completed/Met Goal: LTG Patient will propel w/c in home environment (PT) Description: LTG:  Patient will propel wheelchair in home environment, # of feet with assistance (PT). Outcome: Completed/Met   Problem: RH Stairs Goal: LTG Patient will ambulate up and down stairs w/assist (PT) Description: LTG: Patient will ambulate up and down # of stairs with assistance (PT) Outcome: Completed/Met   

## 2020-10-06 NOTE — Plan of Care (Signed)
  Problem: RH Balance Goal: LTG: Patient will maintain dynamic sitting balance (OT) Description: LTG:  Patient will maintain dynamic sitting balance with assistance during activities of daily living (OT) Outcome: Completed/Met Goal: LTG Patient will maintain dynamic standing with ADLs (OT) Description: LTG:  Patient will maintain dynamic standing balance with assist during activities of daily living (OT)  Outcome: Completed/Met   Problem: Sit to Stand Goal: LTG:  Patient will perform sit to stand in prep for activites of daily living with assistance level (OT) Description: LTG:  Patient will perform sit to stand in prep for activites of daily living with assistance level (OT) Outcome: Completed/Met   Problem: RH Eating Goal: LTG Patient will perform eating w/assist, cues/equip (OT) Description: LTG: Patient will perform eating with assist, with/without cues using equipment (OT) Outcome: Completed/Met   Problem: RH Grooming Goal: LTG Patient will perform grooming w/assist,cues/equip (OT) Description: LTG: Patient will perform grooming with assist, with/without cues using equipment (OT) Outcome: Completed/Met   Problem: RH Bathing Goal: LTG Patient will bathe all body parts with assist levels (OT) Description: LTG: Patient will bathe all body parts with assist levels (OT) Outcome: Completed/Met   Problem: RH Dressing Goal: LTG Patient will perform upper body dressing (OT) Description: LTG Patient will perform upper body dressing with assist, with/without cues (OT). Outcome: Completed/Met Goal: LTG Patient will perform lower body dressing w/assist (OT) Description: LTG: Patient will perform lower body dressing with assist, with/without cues in positioning using equipment (OT) Outcome: Completed/Met   Problem: RH Toileting Goal: LTG Patient will perform toileting task (3/3 steps) with assistance level (OT) Description: LTG: Patient will perform toileting task (3/3 steps) with  assistance level (OT)  Outcome: Completed/Met   Problem: RH Functional Use of Upper Extremity Goal: LTG Patient will use RT/LT upper extremity as a (OT) Description: LTG: Patient will use right/left upper extremity as a stabilizer/gross assist/diminished/nondominant/dominant level with assist, with/without cues during functional activity (OT) Outcome: Completed/Met   Problem: RH Toilet Transfers Goal: LTG Patient will perform toilet transfers w/assist (OT) Description: LTG: Patient will perform toilet transfers with assist, with/without cues using equipment (OT) Outcome: Completed/Met   Problem: RH Tub/Shower Transfers Goal: LTG Patient will perform tub/shower transfers w/assist (OT) Description: LTG: Patient will perform tub/shower transfers with assist, with/without cues using equipment (OT) Outcome: Completed/Met   Problem: RH Furniture Transfers Goal: LTG Patient will perform furniture transfers w/assist (OT/PT) Description: LTG: Patient will perform furniture transfers  with assistance (OT/PT). Outcome: Completed/Met

## 2020-10-06 NOTE — Progress Notes (Signed)
Occupational Therapy Discharge Summary  Patient Details  Name: Darryl Sullivan MRN: [id number] Date of Birth: 07/02/1965  Today's Date: 10/06/2020 OT Individual Time: 7846-9629 OT Individual Time Calculation (min): 68 min    Patient has met 13 of 13 long term goals due to improved activity tolerance, improved balance, postural control, ability to compensate for deficits, functional use of  LEFT upper extremity, improved attention, improved awareness and improved coordination.  Patient to discharge at overall Supervision level.  Patient's care partner unavailable to provide the necessary physical assistance at discharge.  Family education completed with daughter, and daughter demonstrates good understanding of necessary A required at DC, however, she does not reside with pt.  Reasons goals not met: NA  Recommendation:  Patient will benefit from ongoing skilled OT services in home health setting to continue to advance functional skills in the area of BADL and Reduce care partner burden.  Equipment: 3in1, TTB  Reasons for discharge: treatment goals met and discharge from hospital  Patient/family agrees with progress made and goals achieved: Yes  OT Discharge Precautions/Restrictions  Precautions Precautions: Fall Precaution Comments: L hemi UE> LE, L AFO, mild dysarthria Restrictions Weight Bearing Restrictions: No Pain Pain Assessment Pain Scale: Faces Faces Pain Scale: Hurts little more Pain Type: Acute pain Pain Location: Shoulder Pain Orientation: Left Pain Intervention(s): RN made aware ADL ADL Eating: Independent Where Assessed-Eating: Chair,Wheelchair Grooming: Modified independent Where Assessed-Grooming: Sitting at sink,Wheelchair Upper Body Bathing: Minimal assistance Where Assessed-Upper Body Bathing: Shower Lower Body Bathing: Supervision/safety Where Assessed-Lower Body Bathing: Shower Upper Body Dressing: Supervision/safety Where Assessed-Upper Body  Dressing: Sitting at sink Lower Body Dressing: Supervision/safety Where Assessed-Lower Body Dressing: Wheelchair Toileting: Supervision/safety Where Assessed-Toileting: Glass blower/designer: Therapist, music Method: Counselling psychologist: Raised toilet seat,Grab bars Tub/Shower Transfer: Metallurgist Method: Ambulating,Squat pivot,Stand pivot Tub/Shower Equipment: Facilities manager: Curator Method: Midwife: Gaffer Baseline Vision/History: No visual deficits Patient Visual Report: No change from baseline Vision Assessment?: Yes Eye Alignment: Within Functional Limits Ocular Range of Motion: Within Functional Limits Alignment/Gaze Preference: Within Defined Limits Tracking/Visual Pursuits: Able to track stimulus in all quads without difficulty Saccades: Within functional limits Convergence: Within functional limits Visual Fields: No apparent deficits Perception  Perception: Within Functional Limits Praxis Praxis: Intact Cognition Overall Cognitive Status: Within Functional Limits for tasks assessed Arousal/Alertness: Awake/alert Orientation Level: Oriented X4 Attention: Sustained Sustained Attention: Appears intact Memory: Appears intact Immediate Memory Recall: Sock;Blue;Bed Memory Recall Sock: Without Cue Memory Recall Blue: Without Cue Memory Recall Bed: Without Cue Awareness: Appears intact Problem Solving: Appears intact Safety/Judgment: Appears intact Sensation Sensation Light Touch: Appears Intact Central sensation comments: C/o mild tingling to L digits, able to appreciate LT. Hot/Cold: Appears Intact Proprioception: Impaired by gross assessment Stereognosis: Impaired by gross assessment Additional Comments: impaired LUE, RUE WFL Coordination Gross Motor Movements are Fluid and Coordinated:  No Fine Motor Movements are Fluid and Coordinated: No Coordination and Movement Description: L hemi LUE>LLE Finger Nose Finger Test: unable to perform on L side Heel Shin Test: impaired on the LLE due to strength and coordination deficits. greatly improved from eval Motor  Motor Motor: Hemiplegia Motor - Discharge Observations: L hemi LUE>LLE; improved from eval Mobility  Bed Mobility Bed Mobility: Supine to Sit;Rolling Right;Rolling Left;Sit to Supine Rolling Right: Supervision/verbal cueing Rolling Left: Supervision/Verbal cueing Supine to Sit: Supervision/Verbal cueing Sit to Supine: Supervision/Verbal cueing Transfers Sit to Stand: Supervision/Verbal cueing Stand  to Sit: Supervision/Verbal cueing  Trunk/Postural Assessment  Cervical Assessment Cervical Assessment: Exceptions to [facility] Thoracic Assessment Thoracic Assessment: Exceptions to [facility] Lumbar Assessment Lumbar Assessment: Within Functional Limits Postural Control Postural Control: Deficits on evaluation (mild delay on the L side)  Balance Balance Balance Assessed: Yes Static Sitting Balance Static Sitting - Balance Support: No upper extremity supported Static Sitting - Level of Assistance: 7: Independent Dynamic Sitting Balance Dynamic Sitting - Balance Support: No upper extremity supported Dynamic Sitting - Level of Assistance: 5: Stand by assistance;6: Modified independent (Device/Increase time) Static Standing Balance Static Standing - Balance Support: No upper extremity supported Static Standing - Level of Assistance: 5: Stand by assistance Dynamic Standing Balance Dynamic Standing - Balance Support: Right upper extremity supported;During functional activity Dynamic Standing - Level of Assistance: 5: Stand by assistance Extremity/Trunk Assessment RUE Assessment RUE Assessment: Within Functional Limits Active Range of Motion (AROM) Comments: 3/4 to full shoulder flexion General Strength Comments: 5/5 in  shoulder flexion LUE Assessment LUE Assessment: Exceptions to [facility] Active Range of Motion (AROM) Comments: ~85* active shoulder flexion General Strength Comments: 2+/5 LUE Body System: Neuro Brunstrum levels for arm and hand: Arm;Hand Brunstrum level for arm: Stage II Synergy is developing;Stage III Synergy is performed voluntarily Brunstrum level for hand: Stage II Synergy is developing LUE Strength LUE Overall Strength: Deficits   Session Note: Pt received seated in w/c, agreeable to therapy. DC reassessments completed as documented above. STS and amb to and from toilet CGA with hemi-walker. S for LB clothing management and pericare in standing. Stood to wash hands at sink close S. W/c transport to and from gym 2/2 time management. Pt cont to c/o L shoulder pain and L forearm pain with active digit extension, attempted to apply Saebo one to L digit extensor, but pt c/o pain so terminate. Completed massed practice of forward towel slides, ulnar/radial deviation, forearm/LUE WB. Provided printed HEP of the completed exercises and reviewed therapeutic positioning of LUE. Pt verbalized understanding. Stand-pivot back to bed with use of bed rail close S. Returned to supine close S. Doffed shoes mod A to remove L shoe/AFO. Pt left semi-reclined in bed with bed alarm engaged, call bell in reach, and all immediate needs met.    Volanda Napoleon MS, OTR/L  10/06/2020, 10:50 AM

## 2020-10-06 NOTE — Progress Notes (Signed)
PROGRESS NOTE   Subjective/Complaints:  Pt looking fwd to d/c Had some finger pain last noc was not wearng hand splint, pain mainly when he was stretching fingers ROS: Patient denies CP SOB, N/V/D    Objective:   No results found. No results for input(s): WBC, HGB, HCT, PLT in the last 72 hours. No results for input(s): NA, K, CL, CO2, GLUCOSE, BUN, CREATININE, CALCIUM in the last 72 hours.  Intake/Output Summary (Last 24 hours) at 10/06/2020 0846 Last data filed at 10/06/2020 0730 Gross per 24 hour  Intake 1040 ml  Output 1650 ml  Net -610 ml        Physical Exam: Vital Signs Blood pressure 110/74, pulse 67, temperature 97.9 F (36.6 C), resp. rate 20, height 5\' 7"  (1.702 m), weight 100.3 kg, SpO2 100 %.  General: No acute distress Mood and affect are appropriate Heart: Regular rate and rhythm no rubs murmurs or extra sounds Lungs: Clear to auscultation, breathing unlabored, no rales or wheezes Abdomen: Positive bowel sounds, soft nontender to palpation, nondistended Extremities: No clubbing, cyanosis, or edema Skin: No evidence of breakdown, no evidence of rash Neurologic:  motor strength is 5/5 in right , 3- Left  deltoid, bicep, tricep, 5/5 RIght and 2- left grip, 5/5 R and 3- Left hip flexor, knee extensors, 0 ankle dorsiflexor and trace plantar flexor Sensory exam normal sensation to light touch and proprioception in bilateral upper and lower extremities  Musculoskeletal: Full range of motion in all 4 extremities. No joint swelling, No pain with L shoulder AROM   Assessment/Plan: 1. Functional deficits which require 3+ hours per day of interdisciplinary therapy in a comprehensive inpatient rehab setting.  Physiatrist is providing close team supervision and 24 hour management of active medical problems listed below.  Physiatrist and rehab team continue to assess barriers to discharge/monitor patient progress  toward functional and medical goals  Care Tool:  Bathing    Body parts bathed by patient: Front perineal area,Buttocks,Left arm,Chest,Abdomen,Right upper leg,Left upper leg,Right lower leg,Left lower leg,Face   Body parts bathed by helper: Right arm     Bathing assist Assist Level: Minimal Assistance - Patient > 75%     Upper Body Dressing/Undressing Upper body dressing   What is the patient wearing?: Pull over shirt    Upper body assist Assist Level: Supervision/Verbal cueing    Lower Body Dressing/Undressing Lower body dressing      What is the patient wearing?: Pants,Incontinence brief     Lower body assist Assist for lower body dressing: Supervision/Verbal cueing     Toileting Toileting    Toileting assist Assist for toileting: Contact Guard/Touching assist     Transfers Chair/bed transfer  Transfers assist     Chair/bed transfer assist level: Contact Guard/Touching assist     Locomotion Ambulation   Ambulation assist      Assist level: Minimal Assistance - Patient > 75% Assistive device: Walker-hemi Max distance: 40   Walk 10 feet activity   Assist     Assist level: Minimal Assistance - Patient > 75% Assistive device: Walker-hemi   Walk 50 feet activity   Assist Walk 50 feet with 2 turns activity did not  occur: Safety/medical concerns  Assist level: Contact Guard/Touching assist Assistive device: Walker-hemi    Walk 150 feet activity   Assist Walk 150 feet activity did not occur: Safety/medical concerns         Walk 10 feet on uneven surface  activity   Assist Walk 10 feet on uneven surfaces activity did not occur: Safety/medical concerns   Assist level: Moderate Assistance - Patient - 50 - 74% Assistive device: Photographer Will patient use wheelchair at discharge?: Yes Type of Wheelchair: Manual Wheelchair activity did not occur: Safety/medical concerns  Wheelchair assist level:  Contact Guard/Touching assist,Minimal Assistance - Patient > 75%      Wheelchair 50 feet with 2 turns activity    Assist    Wheelchair 50 feet with 2 turns activity did not occur: Safety/medical concerns   Assist Level: Minimal Assistance - Patient > 75%,Contact Guard/Touching assist (min A with fatigue)   Wheelchair 150 feet activity     Assist  Wheelchair 150 feet activity did not occur: Safety/medical concerns       Blood pressure 110/74, pulse 67, temperature 97.9 F (36.6 C), resp. rate 20, height 5\' 7"  (1.702 m), weight 100.3 kg, SpO2 100 %.  Medical Problem List and Plan: 1.  Left hemiparesis, deficits with mobility, self-care secondary to right basal ganglia infarct with white matter changes with left basal ganglia punctate hemorrhage post TPA             -patient may shower             -ELOS/Goals: 4/29/supervision/min a             -Continue CIR therapies including PT, OT, and SLP -   2.  Impaired mobility -DVT/anticoagulation:  Pharmaceutical: Continue Lovenox             -antiplatelet therapy: Low dose ASA.  3. Pain Management: Continue Voltaren gel for left shoulder subluxation.              Moderate increased exertion 4. Mood: LCSW to follow for evaluation and support.              -antipsychotic agents: N/A 5. Neuropsych: This patient is capable of making decisions on his own behalf. 6. Skin/Wound Care: Routine pressure relief measures 7. Fluids/Electrolytes/Nutrition: Monitor I/Os. Encourage fluid intake.  8. HTN: Monitor BP tid--continue HCTZ, Norvasc, and metoprolol              Vitals:   10/05/20 1937 10/06/20 0430  BP: 136/90 110/74  Pulse: 66 67  Resp: 20 20  Temp: 98.1 F (36.7 C) 97.9 F (36.6 C)  SpO2: 99% 100%  4/28 bp controlled 9. CKD III: Will monitor renal status with serial checks             --avoid hypoperfusion             --BUN/SCr 21/1.31 at admission-->34/1.42. now 57 and 2.17, IVF at noc             4/11: 1.53 on  4/11,at baseline off IVF 1.51 on 4/18 10. Dyslipidemia: Lipitor 11. Impaired fasting glucose:  Hgb A1c-4.7- normal             -- Elevated fasting BS likely due to stress.will not need CBG monitoring long term              Moderate increased mobility 12. Tobacco abuse  Counsel 13. Hypokalemia: K+ 3.2, supplement with BID today , repeat 4/25 3.8 cont KCL BID   14.  Left shoulder subacromial impingement syndrome-   subacromial injection L shoulder was helpful on 4/20   LOS: 22 days A FACE TO FACE EVALUATION WAS PERFORMED  Erick Colace 10/06/2020, 8:46 AM

## 2020-10-06 NOTE — Progress Notes (Addendum)
[facility]  The overall goal for the admission was met for:   Discharge location: Yes, home  Length of Stay: Yes, 23 Days   Discharge activity level: Yes, Sup/CGA  Home/community participation: Yes  Services provided included: MD, RD, PT, OT, SLP, RN, CM, TR, Pharmacy, Neuropsych and SW  Financial Services: Private Insurance: [place] offered to/list presented to:pt and pt daughter   Follow-up services arranged: [facility] & [place]  Comments (or additional information): PT OT  Wheelchair, Hemi Walker, Producer, television/film/video, Bedside Commode  Patient/Family verbalized understanding of follow-up arrangements: Yes  Individual responsible for coordination of the follow-up plan: Vikki Ports, [phone]  Confirmed correct DME delivered: Dyanne Iha 10/06/2020    Dyanne Iha

## 2020-10-07 MED ORDER — ACETAMINOPHEN 325 MG PO TABS: 325.0000 mg | ORAL_TABLET | ORAL | Status: DC | PRN

## 2020-10-07 MED ORDER — METOPROLOL TARTRATE 25 MG PO TABS: 25.0000 mg | ORAL_TABLET | Freq: Two times a day (BID) | ORAL | 0 refills | Status: DC

## 2020-10-07 MED ORDER — DICLOFENAC SODIUM 1 % EX GEL: 2.0000 g | Freq: Four times a day (QID) | CUTANEOUS | 0 refills | Status: AC | PRN

## 2020-10-07 MED ORDER — AMLODIPINE BESYLATE 10 MG PO TABS: 10.0000 mg | ORAL_TABLET | Freq: Every day | ORAL | 0 refills | Status: DC

## 2020-10-07 MED ORDER — ATORVASTATIN CALCIUM 80 MG PO TABS: 80.0000 mg | ORAL_TABLET | Freq: Every day | ORAL | 0 refills | Status: DC

## 2020-10-07 MED ORDER — POTASSIUM CHLORIDE CRYS ER 20 MEQ PO TBCR: 20.0000 meq | EXTENDED_RELEASE_TABLET | Freq: Every day | ORAL | 0 refills | Status: DC

## 2020-10-07 MED ORDER — HYDROCHLOROTHIAZIDE 25 MG PO TABS: 25.0000 mg | ORAL_TABLET | Freq: Every day | ORAL | 0 refills | Status: DC

## 2020-10-07 MED ORDER — MUSCLE RUB 10-15 % EX CREA: 1.0000 "application " | TOPICAL_CREAM | Freq: Two times a day (BID) | CUTANEOUS | 0 refills | Status: AC | PRN

## 2020-10-07 MED ORDER — PANTOPRAZOLE SODIUM 40 MG PO TBEC: 40.0000 mg | DELAYED_RELEASE_TABLET | Freq: Every day | ORAL | 0 refills | Status: DC

## 2020-10-07 MED ORDER — ASPIRIN 81 MG PO TBEC: 81.0000 mg | DELAYED_RELEASE_TABLET | Freq: Every day | ORAL | 0 refills | Status: AC

## 2020-10-07 NOTE — Progress Notes (Signed)
PROGRESS NOTE   Subjective/Complaints:  No issues overnite, looking forward to d/c, some left finger pain with stretching, no shoulder pain at rest   ROS: Patient denies CP SOB, N/V/D    Objective:   No results found. No results for input(s): WBC, HGB, HCT, PLT in the last 72 hours. No results for input(s): NA, K, CL, CO2, GLUCOSE, BUN, CREATININE, CALCIUM in the last 72 hours.  Intake/Output Summary (Last 24 hours) at 10/07/2020 0849 Last data filed at 10/07/2020 0720 Gross per 24 hour  Intake 600 ml  Output 2 ml  Net 598 ml        Physical Exam: Vital Signs Blood pressure 125/78, pulse 71, temperature 99.5 F (37.5 C), temperature source Oral, resp. rate 20, height 5\' 7"  (1.702 m), weight 100.3 kg, SpO2 99 %.   General: No acute distress Mood and affect are appropriate Heart: Regular rate and rhythm no rubs murmurs or extra sounds Lungs: Clear to auscultation, breathing unlabored, no rales or wheezes Abdomen: Positive bowel sounds, soft nontender to palpation, nondistended Extremities: No clubbing, cyanosis, or edema Skin: No evidence of breakdown, no evidence of rash   Neurologic:  motor strength is 5/5 in right , 3- Left  deltoid, bicep, tricep, 5/5 RIght and 2- left grip, 5/5 R and 3- Left hip flexor, knee extensors, 0 ankle dorsiflexor and trace plantar flexor Sensory exam normal sensation to light touch and proprioception in bilateral upper and lower extremities  Musculoskeletal: Full range of motion in all 4 extremities. No joint swelling,  pain with L shoulder AROM > 90 deg abd or flexion   Assessment/Plan:  1. Functional deficits due to CVA with left hemiplegia  Stable for D/C today F/u PCP in 3-4 weeks F/u PM&R 2 weeks See D/C summary See D/C instructions Care Tool:  Bathing    Body parts bathed by patient: Front perineal area,Buttocks,Left arm,Chest,Abdomen,Right upper leg,Left upper  leg,Right lower leg,Left lower leg,Face   Body parts bathed by helper: Right arm     Bathing assist Assist Level: Minimal Assistance - Patient > 75%     Upper Body Dressing/Undressing Upper body dressing   What is the patient wearing?: Pull over shirt    Upper body assist Assist Level: Supervision/Verbal cueing    Lower Body Dressing/Undressing Lower body dressing      What is the patient wearing?: Pants,Incontinence brief     Lower body assist Assist for lower body dressing: Supervision/Verbal cueing     Toileting Toileting    Toileting assist Assist for toileting: Supervision/Verbal cueing     Transfers Chair/bed transfer  Transfers assist     Chair/bed transfer assist level: Supervision/Verbal cueing     Locomotion Ambulation   Ambulation assist      Assist level: Supervision/Verbal cueing Assistive device: Walker-hemi Max distance: 85   Walk 10 feet activity   Assist     Assist level: Minimal Assistance - Patient > 75% Assistive device: Walker-hemi   Walk 50 feet activity   Assist Walk 50 feet with 2 turns activity did not occur: Safety/medical concerns  Assist level: Supervision/Verbal cueing Assistive device: Walker-hemi    Walk 150 feet activity   Assist  Walk 150 feet activity did not occur: Safety/medical concerns         Walk 10 feet on uneven surface  activity   Assist Walk 10 feet on uneven surfaces activity did not occur: Safety/medical concerns   Assist level: Contact Guard/Touching assist Assistive device: Education administrator Will patient use wheelchair at discharge?: Yes Type of Wheelchair: Manual Wheelchair activity did not occur: Safety/medical concerns  Wheelchair assist level: Supervision/Verbal cueing Max wheelchair distance: 150    Wheelchair 50 feet with 2 turns activity    Assist    Wheelchair 50 feet with 2 turns activity did not occur: Safety/medical  concerns   Assist Level: Supervision/Verbal cueing   Wheelchair 150 feet activity     Assist  Wheelchair 150 feet activity did not occur: Safety/medical concerns   Assist Level: Supervision/Verbal cueing   Blood pressure 125/78, pulse 71, temperature 99.5 F (37.5 C), temperature source Oral, resp. rate 20, height 5\' 7"  (1.702 m), weight 100.3 kg, SpO2 99 %.  Medical Problem List and Plan: 1.  Left hemiparesis, deficits with mobility, self-care secondary to right basal ganglia infarct with white matter changes with left basal ganglia punctate hemorrhage post TPA             -patient may shower             -ELOS/Goals: 4/29/supervision/min a             -Continue CIR therapies including PT, OT, and SLP -   2.  Impaired mobility -DVT/anticoagulation:  Pharmaceutical: Continue Lovenox             -antiplatelet therapy: Low dose ASA.  3. Pain Management: Continue Voltaren gel for left shoulder subluxation.              Moderate increased exertion 4. Mood: LCSW to follow for evaluation and support.              -antipsychotic agents: N/A 5. Neuropsych: This patient is capable of making decisions on his own behalf. 6. Skin/Wound Care: Routine pressure relief measures 7. Fluids/Electrolytes/Nutrition: Monitor I/Os. Encourage fluid intake.  8. HTN: Monitor BP tid--continue HCTZ, Norvasc, and metoprolol              Vitals:   10/06/20 1950 10/07/20 0414  BP: 133/83 125/78  Pulse: 70 71  Resp: 19 20  Temp: 98.9 F (37.2 C) 99.5 F (37.5 C)  SpO2: 99% 99%  4/29 bp controlled 9. CKD III: Will monitor renal status with serial checks             --avoid hypoperfusion             --BUN/SCr 21/1.31 at admission-->34/1.42. now 57 and 2.17, IVF at noc             4/11: 1.53 on 4/11,at baseline off IVF 1.51 on 4/18 10. Dyslipidemia: Lipitor 11. Impaired fasting glucose:  Hgb A1c-4.7- normal             -- Elevated fasting BS likely due to stress.will not need CBG monitoring long  term              Moderate increased mobility 12. Tobacco abuse             Counsel 13. Hypokalemia: K+ 3.2, supplement with 5/18 BID today , repeat 4/25 3.8 cont KCL 5/25 BID   14.  Left shoulder subacromial impingement syndrome-   subacromial injection L shoulder was  helpful on 4/20- may need repeat in 1 mo may do glenohumeral    LOS: 23 days A FACE TO FACE EVALUATION WAS PERFORMED  Erick Colace 10/07/2020, 8:49 AM

## 2020-10-07 NOTE — Progress Notes (Signed)
Patient discharged home with family. Discharge education complete, no concerns, all belongings sent with patient

## 2020-10-07 NOTE — Discharge Instructions (Signed)
Inpatient Rehab Discharge Instructions  JAYCEE PELZER Discharge date and time: 10/07/20   Activities/Precautions/ Functional Status: Activity: no lifting, driving, or strenuous exercise  TILL FOLLOW UP WITH MD. Diet: cardiac diet Wound Care: none needed    Functional status:  ___ No restrictions     ___ Walk up steps independently _x__ 24/7 supervision/assistance   ___ Walk up steps with assistance ___ Intermittent supervision/assistance  ___ Bathe/dress independently ___ Walk with walker     _X__ Bathe/dress with assistance ___ Walk Independently    ___ Shower independently _X__ Walk with supervision    ___ Shower with assistance ___ No alcohol     ___ Return to work/school ________   Special Instructions:  COMMUNITY REFERRALS UPON DISCHARGE:    Home Health:   PT     OT                       Agency: [facility]  Phone: [phone]    Medical Equipment/Items Ordered: Wheelchair, Interior and spatial designer, Advertising copywriter, Bedside Commode                                                  Agency/Supplier: [employer]     My questions have been answered and I understand these instructions. I will adhere to these goals and the provided educational materials after my discharge from the hospital.  Patient/Caregiver Signature _______________________________ Date __________  Clinician Signature _______________________________________ Date __________  Please bring this form and your medication list with you to all your follow-up doctor's appointments.

## 2020-10-10 DIAGNOSIS — M7542 Impingement syndrome of left shoulder: Secondary | ICD-10-CM

## 2020-10-11 ENCOUNTER — Telehealth: Payer: Self-pay | Admitting: Registered Nurse

## 2020-10-11 NOTE — Telephone Encounter (Signed)
Transitional Care call Transitional Questions answered by Daughter [facility]  Patient name: Darryl Sullivan DOB: August 11, 1965  1. Are you/is patient experiencing any problems since coming home? No a. Are there any questions regarding any aspect of care? No 2. Are there any questions regarding medications administration/dosing? No a. Are meds being taken as prescribed? Yes  b. "Patient should review meds with caller to confirm" Medication List Reviewed 3. Have there been any falls? No 4. Has Home Health been to the house and/or have they contacted you? No,call placed to [facility],  Mr. Tomeo has an appointment scheduled.  a. If not, have you tried to contact them? See  b. Can we help you contact them? NA 5. Are bowels and bladder emptying properly? Yes a. Are there any unexpected incontinence issues? No b. If applicable, is patient following bowel/bladder programs? No 6. Any fevers, problems with breathing, unexpected pain? No 7. Are there any skin problems or new areas of breakdown? No 8. Has the patient/family member arranged specialty MD follow up (ie cardiology/neurology/renal/surgical/etc.)?  This provider placed a call to [facility] , they will call to schedule HFU appointment. Asked [name] to call his PCP to schedul HFU appointment, she verbalizes understanding.  a. Can we help arrange? No 9. Does the patient need any other services or support that we can help arrange? No 10. Are caregivers following through as expected in assisting the patient? Yes 11. Has the patient quit smoking, drinking alcohol, or using drugs as recommended? (                        )  Appointment date/time 10/18/2020,  arrival time 1;40 FOR 2:00 appointment with Dr Wynn Banker. At [address] [employer] [address]

## 2020-10-13 ENCOUNTER — Ambulatory Visit: Payer: BC Managed Care – PPO | Admitting: Speech Pathology

## 2020-10-13 ENCOUNTER — Ambulatory Visit: Payer: BC Managed Care – PPO | Attending: Physician Assistant | Admitting: Occupational Therapy

## 2020-10-13 ENCOUNTER — Ambulatory Visit: Payer: BC Managed Care – PPO | Admitting: Occupational Therapy

## 2020-10-17 ENCOUNTER — Ambulatory Visit: Payer: BC Managed Care – PPO | Admitting: Occupational Therapy

## 2020-10-18 ENCOUNTER — Telehealth: Payer: Self-pay

## 2020-10-18 ENCOUNTER — Encounter: Payer: BC Managed Care – PPO | Admitting: Physical Medicine & Rehabilitation

## 2020-10-18 DIAGNOSIS — I6381 Other cerebral infarction due to occlusion or stenosis of small artery: Secondary | ICD-10-CM

## 2020-10-18 NOTE — Telephone Encounter (Signed)
The patients wife is requesting change of physical therapy are we able to assist with that or does she need to call her self wants to change the place. Thank you

## 2020-10-19 ENCOUNTER — Ambulatory Visit: Payer: BC Managed Care – PPO | Admitting: Speech Pathology

## 2020-10-20 NOTE — Telephone Encounter (Signed)
We have misunderstood where Darryl Sullivan is to get his therapies.  He lives in [place] but is staying with his ex wife in [place] who is responsible for taking him to therapy.  His therapies all need to be at [facility]. Changes have been corrected.

## 2020-10-24 ENCOUNTER — Ambulatory Visit (HOSPITAL_COMMUNITY): Payer: BC Managed Care – PPO | Attending: Physical Medicine & Rehabilitation

## 2020-10-25 ENCOUNTER — Ambulatory Visit: Payer: BC Managed Care – PPO | Admitting: Speech Pathology

## 2020-10-25 ENCOUNTER — Ambulatory Visit (HOSPITAL_COMMUNITY): Payer: BC Managed Care – PPO | Admitting: Speech Pathology

## 2020-10-25 ENCOUNTER — Ambulatory Visit: Payer: BC Managed Care – PPO | Admitting: Occupational Therapy

## 2020-10-27 ENCOUNTER — Encounter: Payer: Self-pay | Admitting: Physical Medicine & Rehabilitation

## 2020-10-27 ENCOUNTER — Other Ambulatory Visit: Payer: Self-pay

## 2020-10-27 ENCOUNTER — Encounter
Payer: BC Managed Care – PPO | Attending: Physical Medicine & Rehabilitation | Admitting: Physical Medicine & Rehabilitation

## 2020-10-27 VITALS — BP 132/79 | HR 64 | Temp 98.9°F | Ht 67.0 in | Wt 220.0 lb

## 2020-10-27 DIAGNOSIS — I6381 Other cerebral infarction due to occlusion or stenosis of small artery: Secondary | ICD-10-CM | POA: Insufficient documentation

## 2020-10-27 DIAGNOSIS — M7542 Impingement syndrome of left shoulder: Secondary | ICD-10-CM | POA: Diagnosis not present

## 2020-10-27 DIAGNOSIS — I10 Essential (primary) hypertension: Secondary | ICD-10-CM | POA: Diagnosis not present

## 2020-10-27 MED ORDER — POTASSIUM CHLORIDE CRYS ER 20 MEQ PO TBCR: 20.0000 meq | EXTENDED_RELEASE_TABLET | Freq: Every day | ORAL | 0 refills | Status: DC

## 2020-10-27 MED ORDER — METOPROLOL TARTRATE 25 MG PO TABS: 25.0000 mg | ORAL_TABLET | Freq: Two times a day (BID) | ORAL | 0 refills | Status: DC

## 2020-10-27 MED ORDER — HYDROCHLOROTHIAZIDE 25 MG PO TABS: 25.0000 mg | ORAL_TABLET | Freq: Every day | ORAL | 0 refills | Status: DC

## 2020-10-27 MED ORDER — ATORVASTATIN CALCIUM 80 MG PO TABS: 80.0000 mg | ORAL_TABLET | Freq: Every day | ORAL | 0 refills | Status: DC

## 2020-10-27 MED ORDER — AMLODIPINE BESYLATE 10 MG PO TABS: 10.0000 mg | ORAL_TABLET | Freq: Every day | ORAL | 0 refills | Status: DC

## 2020-10-27 NOTE — Patient Instructions (Addendum)
Please call [facility] family medicine for appt    Consider Botox injection to loosen Left finger flexors

## 2020-10-27 NOTE — Progress Notes (Signed)
Subjective:    Patient ID: Darryl Sullivan, male    DOB: 07-17-1965, 55 y.o.   MRN: [id number]  56 y.o. male in relatively good health who was admitted on [facility] on 09/05/20 with left sided weakness and malignant HTN- BP 231/126. CTA head negative for LVO and he received tPA. UDS negative and he was started on Cleveprex and IV labetalol. Cleveprex. MRI brain done showing areas of ischemia in left temporal white matter and right basal ganglia. . Follow up CT head showed small left basal ganglia hemorrhage. Stroke felt to be due to small vessel disease and low dose ASA recommended as well as outpatient sleep study to rule out OSA.  He did have worsening of renal status and renal ultrasound revealed medical renal disease. Medications added and titrated for better BP control. He continued to be limited by tachycardia with activity as well as LUE/LLE weakness.  He has developed pain due to left shoulder subluxation and voltaren gel added with minimal relief. Left shoulder was injected with betamethasone on 04/20 to help with symptoms.    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  Mechanism of stroke likely prolonged hypertension.  Has returned to home with ex-wife. Using 4 pt cane, amb to BR needs help with donning shirt Denies fall,   Going to OP therapy , [facility] PT and OT and SLP  Has not seen PCP yet, his PCP is in [place] he now lives in [place] and his wife would like to get him in with the practice that she uses for her own primary care.  Pt running out of HTN meds next week and do not know who will rx  Pain Inventory Average Pain 2 Pain Right Now 1 My pain is intermittent, constant and burning  LOCATION OF PAIN  Left shoulder, left hand, left arm & left leg  BOWEL Number of stools per week: 2 Oral laxative use  No  Type of laxative none Enema or suppository use NA History of colostomy No  Incontinent No   BLADDER Normal In and out cath, frequency N/A Able to self cath No  Bladder incontinence No  Frequent urination No  Leakage with coughing No  Difficulty starting stream No  Incomplete bladder emptying No    Mobility use a cane how many minutes can you walk? 30 MINS ability to climb steps?  yes do you drive?  no Do you have any goals in this area?  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 I need assistance with the following:  dressing, bathing, meal prep and household duties Do you have any goals in this area?  yes  Neuro/Psych trouble walking  Prior Studies Any changes since last visit?  no New Patient  Physicians involved in your care Any changes since last visit?  no New Patient    Family History  Problem Relation Age of Onset  . Diabetes Mother    Social History   Socioeconomic History  . Marital status: Divorced    Spouse name: Not on file  . Number of children: Not on file  . Years of education: Not on file  . Highest education level: Not on file  Occupational History  . Not on file  Tobacco Use  . Smoking status: Former Smoker    Packs/day: 0.50  Types: Cigarettes  . Smokeless tobacco: Current User  Vaping Use  . Vaping Use: Never used  Substance and Sexual Activity  . Alcohol use: No  . Drug use: No  . Sexual activity: Yes    Birth control/protection: Condom  Other Topics Concern  . Not on file  Social History Narrative  . Not on file   Social Determinants of Health   Financial Resource Strain: Not on file  Food Insecurity: Not on file  Transportation Needs: Not on file  Physical Activity: Not on file  Stress: Not on file  Social Connections: Not on file   History reviewed. No pertinent surgical history. Past Medical History:  Diagnosis Date  . Hypertension    BP 132/79   Pulse 64   Temp 98.9  F (37.2 C)   Ht 5\' 7"  (1.702 m)   Wt 220 lb (99.8 kg)   SpO2 97%   BMI 34.46 kg/m   Opioid Risk Score:   Fall Risk Score:  `1  Depression screen PHQ 2/9  Depression screen PHQ 2/9 10/27/2020  Decreased Interest 0  Down, Depressed, Hopeless 0  PHQ - 2 Score 0  Altered sleeping 0  Tired, decreased energy 0  Change in appetite 0  Feeling bad or failure about yourself  1  Trouble concentrating 0  Moving slowly or fidgety/restless 0  Suicidal thoughts 0  PHQ-9 Score 1   Review of Systems  Musculoskeletal: Positive for gait problem.       Left arm & left leg pain  All other systems reviewed and are negative.      Objective:   Physical Exam Vitals and nursing note reviewed.  Constitutional:      Appearance: He is obese.  HENT:     Head: Normocephalic and atraumatic.  Eyes:     Extraocular Movements: Extraocular movements intact.     Conjunctiva/sclera: Conjunctivae normal.     Pupils: Pupils are equal, round, and reactive to light.  Cardiovascular:     Rate and Rhythm: Normal rate and regular rhythm.     Heart sounds: Normal heart sounds.  Pulmonary:     Effort: Pulmonary effort is normal.     Breath sounds: Normal breath sounds.  Abdominal:     General: Abdomen is flat. Bowel sounds are normal. There is no distension.     Palpations: Abdomen is soft.  Neurological:     Mental Status: He is alert and oriented to person, place, and time.     Comments: 3- Left delt , BI, Tri , Grip, 4 /5 L HF, KE, 3- ankel DF  Gait not tested no cane, sit to stand Mod I  Sensation intact LT In LUE and LLE  Tone MAS 2 in left finger flexors, pain with passive ROM  Psychiatric:        Mood and Affect: Mood normal.    Increased latency of response       Assessment & Plan:  1.  R BG infarct with Left HEMIPARESIS as well as delayed processing Cont OP PT, OT, SLP  2.  Left  Shoulder pain post CVA with + impingement signs as well as adhesive capsulitis- will do Glenohumeral  injection today   Shoulder injectionLeft glenohumeral Without ultrasound guidance)  Indication:Left Shoulder pain not relieved by medication management and other conservative care.  Informed consent was obtained after describing risks and benefits of the procedure with the patient, this includes bleeding, bruising, infection and medication side effects. The patient wishes to  proceed and has given written consent. Patient was placed in a seated position. TheLeft shoulder was marked and prepped with betadine in the subacromial area. A 25-gauge 1-1/2 inch needle was inserted into the subacromial area. After negative draw back for blood, a solution containing 1 mL of 6 mg per ML betamethasone and 4 mL of 1% lidocaine was injected. A band aid was applied. The patient tolerated the procedure well. Post procedure instructions were given.   3.  Severe HTN- now controlled with multiple meds  Renew Amlodipine 10mg  HCTZ 25mg  KCL qd Lopressor 25mg  BID  PCP will Rx when established

## 2020-10-28 ENCOUNTER — Ambulatory Visit: Payer: BC Managed Care – PPO | Admitting: Speech Pathology

## 2020-10-28 ENCOUNTER — Ambulatory Visit: Payer: BC Managed Care – PPO

## 2020-10-31 ENCOUNTER — Ambulatory Visit: Payer: Self-pay | Admitting: Family Medicine

## 2020-11-01 ENCOUNTER — Ambulatory Visit (HOSPITAL_COMMUNITY): Payer: BC Managed Care – PPO | Admitting: Speech Pathology

## 2020-11-01 ENCOUNTER — Ambulatory Visit: Payer: BC Managed Care – PPO | Admitting: Speech Pathology

## 2020-11-01 ENCOUNTER — Ambulatory Visit: Payer: BC Managed Care – PPO | Admitting: Occupational Therapy

## 2020-11-03 ENCOUNTER — Ambulatory Visit (HOSPITAL_COMMUNITY): Payer: BC Managed Care – PPO | Admitting: Speech Pathology

## 2020-11-03 ENCOUNTER — Encounter: Payer: BC Managed Care – PPO | Admitting: Speech Pathology

## 2020-11-03 ENCOUNTER — Ambulatory Visit: Payer: BC Managed Care – PPO | Admitting: Physical Therapy

## 2020-11-04 ENCOUNTER — Ambulatory Visit (HOSPITAL_COMMUNITY): Payer: BC Managed Care – PPO | Admitting: Occupational Therapy

## 2020-11-08 ENCOUNTER — Encounter: Payer: BC Managed Care – PPO | Admitting: Occupational Therapy

## 2020-11-08 ENCOUNTER — Encounter: Payer: BC Managed Care – PPO | Admitting: Speech Pathology

## 2020-11-10 ENCOUNTER — Telehealth (HOSPITAL_COMMUNITY): Payer: Self-pay | Admitting: Occupational Therapy

## 2020-11-10 ENCOUNTER — Encounter (HOSPITAL_COMMUNITY): Payer: BC Managed Care – PPO | Admitting: Occupational Therapy

## 2020-11-10 ENCOUNTER — Ambulatory Visit (HOSPITAL_COMMUNITY): Payer: BC Managed Care – PPO | Admitting: Physical Therapy

## 2020-11-10 NOTE — Telephone Encounter (Signed)
Patient's wife fell as they were leaving the house and now she is at the hospital - this is the 1st time he was able to call to cx and rs. Patient has been r/

## 2020-11-11 ENCOUNTER — Encounter: Payer: BC Managed Care – PPO | Admitting: Speech Pathology

## 2020-11-11 ENCOUNTER — Ambulatory Visit: Payer: BC Managed Care – PPO

## 2020-11-14 ENCOUNTER — Ambulatory Visit: Payer: BC Managed Care – PPO

## 2020-11-14 ENCOUNTER — Encounter: Payer: BC Managed Care – PPO | Admitting: Occupational Therapy

## 2020-11-16 ENCOUNTER — Encounter: Payer: BC Managed Care – PPO | Admitting: Speech Pathology

## 2020-11-16 ENCOUNTER — Encounter: Payer: BC Managed Care – PPO | Admitting: Occupational Therapy

## 2020-11-21 ENCOUNTER — Ambulatory Visit: Payer: BC Managed Care – PPO

## 2020-11-21 ENCOUNTER — Encounter: Payer: BC Managed Care – PPO | Admitting: Speech Pathology

## 2020-11-23 ENCOUNTER — Ambulatory Visit: Payer: BC Managed Care – PPO

## 2020-11-23 ENCOUNTER — Encounter: Payer: BC Managed Care – PPO | Admitting: Occupational Therapy

## 2020-11-23 ENCOUNTER — Encounter: Payer: BC Managed Care – PPO | Admitting: Speech Pathology

## 2020-11-24 ENCOUNTER — Ambulatory Visit (HOSPITAL_COMMUNITY): Payer: BC Managed Care – PPO | Admitting: Occupational Therapy

## 2020-11-24 ENCOUNTER — Ambulatory Visit (HOSPITAL_COMMUNITY): Payer: BC Managed Care – PPO | Admitting: Physical Therapy

## 2020-11-29 ENCOUNTER — Encounter: Payer: BC Managed Care – PPO | Admitting: Occupational Therapy

## 2020-11-29 ENCOUNTER — Encounter: Payer: BC Managed Care – PPO | Admitting: Speech Pathology

## 2020-11-29 ENCOUNTER — Ambulatory Visit: Payer: BC Managed Care – PPO

## 2020-12-01 ENCOUNTER — Ambulatory Visit: Payer: BC Managed Care – PPO

## 2020-12-01 ENCOUNTER — Encounter: Payer: BC Managed Care – PPO | Admitting: Speech Pathology

## 2020-12-02 ENCOUNTER — Other Ambulatory Visit: Payer: Self-pay

## 2020-12-02 ENCOUNTER — Ambulatory Visit (HOSPITAL_COMMUNITY): Payer: BC Managed Care – PPO | Attending: Physical Medicine & Rehabilitation | Admitting: Specialist

## 2020-12-02 ENCOUNTER — Encounter (HOSPITAL_COMMUNITY): Payer: Self-pay | Admitting: Specialist

## 2020-12-02 DIAGNOSIS — R278 Other lack of coordination: Secondary | ICD-10-CM | POA: Diagnosis present

## 2020-12-02 DIAGNOSIS — R600 Localized edema: Secondary | ICD-10-CM | POA: Insufficient documentation

## 2020-12-02 DIAGNOSIS — R29818 Other symptoms and signs involving the nervous system: Secondary | ICD-10-CM | POA: Diagnosis present

## 2020-12-02 DIAGNOSIS — I69354 Hemiplegia and hemiparesis following cerebral infarction affecting left non-dominant side: Secondary | ICD-10-CM | POA: Diagnosis not present

## 2020-12-02 NOTE — Patient Instructions (Signed)
.  AROM: Finger Flexion / Extension    Actively bend fingers of right hand. Start with knuckles furthest from palm, and slowly make a fist. Hold ____ seconds. Relax. Then straighten fingers as far as possible. Repeat ____ times per set. Do ____ sets per session. Do ____ sessions per day.  Copyright  VHI. All rights reserved.  CAREGIVER ASSISTED: Shoulder Abduction    Caregiver raises arm away from body, keeping one hand on top of shoulder. Patient looks at arm. Hold ___ seconds. ___ reps per set, ___ sets per day, ___ days per week  Copyright  VHI. All rights reserved.  CAREGIVER ASSISTED: Shoulder Flexion    Caregiver raises arm forward, keeping one hand on shoulder blade. Patient looks at arm. Hold ___ seconds. ___ reps per set, ___ sets per day, ___ days per week  Copyright  VHI. All rights reserved.  Extension (Passive)    Using other hand, lift hand at wrist as far as possible. Hold ____ seconds. Repeat ____ times. Do ____ sessions per day.  Copyright  VHI. All rights reserved.  Wrist Passive Flexion    Rest right forearm on table, hand palm-down over edge. Bend wrist by pressing hand down with other hand. Hold ____ seconds. Repeat ____ times. Do ____ sessions per day.  http://gt2.exer.us/156   Copyright  VHI. All rights reserved.  PROM: Elbow Flexion / Extension    Grasp left arm at wrist and gently bend elbow as far as possible. Then straighten arm as far as possible. Hold each position ____ seconds. Repeat ____ times per set. Do ____ sets per session. Do ____ sessions per day.  Copyright  VHI. All rights reserved.

## 2020-12-02 NOTE — Therapy (Signed)
[facility] [address] [place], [place], [address] Phone: [phone]   Fax:  [phone]  Occupational Therapy Evaluation  Patient Details  Name: Darryl Sullivan MRN: [id number] Date of Birth: 01/18/1966 Referring Provider (OT): Dr. Claudette LawsAndrew Kirsteins   Encounter Date: 12/02/2020   OT End of Session - 12/02/20 1451     Visit Number 1    Number of Visits 16    Date for OT Re-Evaluation 01/31/21    Authorization Type BCBS PPO    Authorization Time Period 20 visit limit OT and ST, putting 10 as our limit for OT for now. (PT has 30 seperate visits)    Authorization - Visit Number 1    Authorization - Number of Visits 10    OT Start Time 1120    OT Stop Time 1215    OT Time Calculation (min) 55 min    Activity Tolerance Patient tolerated treatment well    Behavior During Therapy WFL for tasks assessed/performed             Past Medical History:  Diagnosis Date   Hypertension     History reviewed. No pertinent surgical history.  There were no vitals filed for this visit.   Subjective Assessment - 12/02/20 1432     Subjective  S:  I want to be able to move my wrist and shoulder again.    Pertinent History Darryl Sullivan is a 55 year old male with a past medical history significant for tobacco abuse who [facility] due to complaints of left-sided weakness and presyncope.  Found to have acute right basal ganglia and left temporal white matter infarct.  Patient received TPA as he was within the window.  He received extensive PT and OT in acute care setting, and then PT, OT, ST while in CIR.  He was dc from CIR to home on 10/06/20.  He has not received any home health therapies since being dc to home.  He was originally referred to OP OT, PT, ST in May, however due to some personal issues he did not attend his scheduled evaluations in May.  He presents today for his OT evaluation, referred by Dr. Wynn BankerKirsteins.    Patient  Stated Goals I want to be able to move my wrist and hand again    Currently in Pain? Yes    Pain Score 7     Pain Location Shoulder    Pain Orientation Left    Pain Descriptors / Indicators Aching;Sharp    Pain Type Acute pain    Pain Radiating Towards upper arm, also wrist pain of same level    Pain Onset More than a month ago    Pain Frequency Intermittent    Aggravating Factors  movement out of synergy pattern    Pain Relieving Factors no movement    Effect of Pain on Daily Activities max               [facility] Assessment - 12/02/20 0001       Assessment   Medical Diagnosis S/P R CVA with LUE hemiparesis    Referring Provider (OT) Dr. Claudette LawsAndrew Kirsteins    Onset Date/Surgical Date 09/05/20    Hand Dominance Right    Next MD Visit 12/08/20    Prior Therapy Acute, CIR      Precautions   Precautions Fall    Required Braces or Orthoses --   AFO     Restrictions  Weight Bearing Restrictions No      Balance Screen   Has the patient fallen in the past 6 months No    Has the patient had a decrease in activity level because of a fear of falling?  No    Is the patient reluctant to leave their home because of a fear of falling?  No      Home  Environment   Family/patient expects to be discharged to: Private residence    Living Arrangements Spouse/significant other    Available Help at Discharge Family      Prior Function   Level of Independence Requires assistive device for independence;Needs assistance with ADLs;Needs assistance with homemaking    Vocation Full time employment    Vocation Requirements worked for [employer], has not returned to work    Leisure spending time with his grandchild      ADL   Eating/Feeding Independent    Grooming Independent    Upper Body Bathing Set up    Lower Body Bathing Set up    Upper Body Dressing Minimal assistance    Lower Body Dressing Set up    Biomedical engineer - Recruitment consultant -  Database administrator Supervision/safety    ADL comments reports independence with basic adls using rue only.  needs assist donning shirts over LUE.  is not able to drive unable to use his LUE actively with any functional task      Mobility   Mobility Status Comments ambulates with a hemiwalker with supervision      Written Expression   Dominant Hand Right      Vision - History   Baseline Vision No visual deficits      Cognition   Overall Cognitive Status Within Functional Limits for tasks assessed      Observation/Other Assessments   Focus on Therapeutic Outcomes (FOTO)  complete at next session      Sensation   Light Touch Appears Intact      Coordination   Gross Motor Movements are Fluid and Coordinated No    Fine Motor Movements are Fluid and Coordinated No      Edema   Edema Left wrist: 18 .0 cm left MCPJ 22.0 cm, issued size large edema glove      Tone   Assessment Location Left Upper Extremity      ROM / Strength   AROM / PROM / Strength AROM;PROM;Strength      Palpation   Palpation comment flexor synergy pattern present, 1 finger subluxation in left shoulder      AROM   AROM Assessment Site Shoulder;Elbow;Forearm;Wrist    Right/Left Shoulder Left    Left Shoulder Flexion 0 Degrees    Left Shoulder ABduction 65 Degrees    Left Shoulder Internal Rotation 90 Degrees    Left Shoulder External Rotation 0 Degrees    Right/Left Elbow Left    Left Elbow Flexion 115    Left Elbow Extension --   +38   Right/Left Forearm Left    Left Forearm Pronation 64 Degrees    Left Forearm Supination 40 Degrees    Right/Left Wrist Left    Left Wrist Extension 3 Degrees    Left Wrist Flexion 3 Degrees      PROM   Overall PROM Comments able to move hand, wrist, elbow through full p/rom, shoulder is approximately 90 flexion, abduction, internal rotation, 5  external rotatation      Strength   Overall Strength Comments strength  not assessed this date      Hand Function   Right Hand Grip (lbs) 80    Right Hand Lateral Pinch 26 lbs    Right Hand 3 Point Pinch 18 lbs    Left Hand Gross Grasp Impaired   25% flexion and extension   Left Hand Grip (lbs) 0    Left Hand Lateral Pinch 0 lbs    Left 3 point pinch 0 lbs      LUE Tone   LUE Tone Severe;Brunnstrom Scale    Brunnstrom Scale (LUE) More marked increase in muscle tone through most of the ROM, but affected part(s) easily moved                      OT Treatments/Exercises (OP) - 12/02/20 0001       Neurological Re-education Exercises   Scapular Stabilization --   completed 5 shoulder shrugs and retraction through 50% range   Other Exercises 1 supine completed p/rom of all joints and ranges 5 times each.    Other Exercises 2 supine development of reach with shoulder protraction, hand over hand support 5 repetitions                   OT Education - 12/02/20 1438     Education Details educated on use of edema glove for his left hand wearing at all times, removing for hygiene, explained contraindications.  educated on HEP for self rom/ prom, aarom of LUE.    Person(s) Educated Patient    Methods Explanation;Demonstration;Handout    Comprehension Verbalized understanding              OT Short Term Goals - 12/02/20 1447       OT SHORT TERM GOAL #1   Title Patient will be educated on HEP for improved mobility of LUE and independence with ADLs.    Time 4    Period Weeks    Status New    Target Date 01/01/21      OT SHORT TERM GOAL #2   Title Patient will improve independence with UE dressing to Independent.    Time 4    Period Weeks    Status New      OT SHORT TERM GOAL #3   Title Patient will decrease pain with p/rom of his LUE to 4/10 or less by decreasing subluxation and swelling in his LUE.    Time 4    Period Weeks    Status New      OT SHORT TERM GOAL #4   Title Patient will improve a/rom of LUE by 25% in order  to use his LUE as a gross assist with daily tasks.    Time 4    Period Weeks    Status New      OT SHORT TERM GOAL #5   Title Patient will be able to weightbear on his LUE during transfers and functional tasks with min facilitation.    Time 4    Period Weeks    Status New               OT Long Term Goals - 12/02/20 1452       OT LONG TERM GOAL #1   Title Patient will use his LUE as an active assist or actively with all desired activities.    Time 8    Period Weeks  Status New    Target Date 01/31/21      OT LONG TERM GOAL #2   Title Patient will improve a/rom outside of synergy pattern to 75% range or better for improved ability to complete functional tasks.    Time 8    Period Weeks    Status New      OT LONG TERM GOAL #3   Title Patient will have functional grasp and release in his left hand in order to hold utensils, cups, etc.    Time 8    Period Weeks    Status New      OT LONG TERM GOAL #4   Title Patient will improve left hand fine motor coordination in order to use his left hand actively to tie shoes or fasten closures on clothing.    Time 8    Period Weeks    Status New      OT LONG TERM GOAL #5   Title Patient will have 3+/5 or better strength in his left arm in order to use left arm actively during daily tasks.    Time 8    Period Weeks    Status New                   Plan - 12/02/20 1443     Clinical Impression Statement A:  Darryl Sullivan is a 55 year old male with a past medical history significant for tobacco abuse who presents to [facility] ED on 09/05/2020 due to complaints of left-sided weakness and presyncope.  Found to have acute right basal ganglia and left temporal white matter infarct.  He received acute care therapy services and CIR therapy services and was dc home on 4/28 without home health services.  He presents with signifcant flexor synergy pattern in his non dominant left upper extremity with minimal volitional movement outside  of synergy pattern.  He is able to complete basic adls using his dominant right arm.  he is not able to work or complete leisure tasks, or drive.    OT Occupational Profile and History Comprehensive Assessment- Review of records and extensive additional review of physical, cognitive, psychosocial history related to current functional performance    Occupational performance deficits (Please refer to evaluation for details): ADL's;IADL's;Work;Rest and Sleep;Leisure;Social Participation    Body Structure / Function / Physical Skills ADL;UE functional use;Pain;FMC;ROM;GMC;IADL;Tone;Edema;Dexterity    Rehab Potential Good    Clinical Decision Making Multiple treatment options, significant modification of task necessary    Comorbidities Affecting Occupational Performance: May have comorbidities impacting occupational performance    Modification or Assistance to Complete Evaluation  Max significant modification of tasks or assist is necessary to complete    OT Frequency 2x / week    OT Duration 8 weeks    OT Treatment/Interventions Self-care/ADL training;Energy conservation;Patient/family education;DME and/or AE instruction;Passive range of motion;Splinting;Therapeutic exercise;Manual Therapy;Therapeutic activities;Neuromuscular education    Plan P: Skilled OT intervention to improve volitional LUE movement outside of synergy pattern in order to use LUE as an active assit or actively with all desired b/iadls, work, and leisure tasks.  next session:   follow up on HEP, p/rom, aa/rom, weightbearing, development of reach tasks.    OT Home Exercise Plan eval:  edema control, prom    Recommended Other Services PT and ST    Consulted and Agree with Plan of Care Patient             Patient will benefit from skilled therapeutic intervention in order to improve  the following deficits and impairments:   Body Structure / Function / Physical Skills: ADL, UE functional use, Pain, FMC, ROM, GMC, IADL, Tone, Edema,  Dexterity       Visit Diagnosis: Hemiplegia and hemiparesis following cerebral infarction affecting left non-dominant side (HCC) - Plan: Ot plan of care cert/re-cert  Other lack of coordination - Plan: Ot plan of care cert/re-cert  Localized edema - Plan: Ot plan of care cert/re-cert  Other symptoms and signs involving the nervous system - Plan: Ot plan of care cert/re-cert    Problem List Patient Active Problem List   Diagnosis Date Noted   Impingement syndrome of left shoulder 10/10/2020   Stroke of right basal ganglia (HCC) 09/14/2020   Dyslipidemia    Stage 3 chronic Sullivan disease (HCC)    Essential hypertension    Acute pain of left shoulder    Tobacco abuse    Hypertensive emergency    Cerebrovascular accident (CVA) (HCC) 09/05/2020    Shirlean Mylar, MHA, OTR/L [phone]  12/02/2020, 3:14 PM   [facility] [address] [place], [place], [address] Phone: [phone]   Fax:  [phone]  Name: Darryl Sullivan MRN: [id number] Date of Birth: 1966-04-07

## 2020-12-06 ENCOUNTER — Encounter: Payer: BC Managed Care – PPO | Admitting: Speech Pathology

## 2020-12-06 ENCOUNTER — Encounter: Payer: BC Managed Care – PPO | Admitting: Occupational Therapy

## 2020-12-06 ENCOUNTER — Ambulatory Visit: Payer: BC Managed Care – PPO

## 2020-12-07 ENCOUNTER — Other Ambulatory Visit: Payer: Self-pay | Admitting: Physical Medicine & Rehabilitation

## 2020-12-07 ENCOUNTER — Encounter (HOSPITAL_COMMUNITY): Payer: BC Managed Care – PPO | Admitting: Occupational Therapy

## 2020-12-08 ENCOUNTER — Encounter: Payer: BC Managed Care – PPO | Admitting: Speech Pathology

## 2020-12-08 ENCOUNTER — Telehealth (HOSPITAL_COMMUNITY): Payer: Self-pay | Admitting: Speech Pathology

## 2020-12-08 ENCOUNTER — Other Ambulatory Visit: Payer: Self-pay | Admitting: *Deleted

## 2020-12-08 ENCOUNTER — Encounter: Payer: BC Managed Care – PPO | Admitting: Occupational Therapy

## 2020-12-08 ENCOUNTER — Ambulatory Visit: Payer: BC Managed Care – PPO

## 2020-12-08 ENCOUNTER — Encounter
Payer: BC Managed Care – PPO | Attending: Physical Medicine & Rehabilitation | Admitting: Physical Medicine & Rehabilitation

## 2020-12-08 DIAGNOSIS — I10 Essential (primary) hypertension: Secondary | ICD-10-CM | POA: Insufficient documentation

## 2020-12-08 DIAGNOSIS — N183 Chronic kidney disease, stage 3 unspecified: Secondary | ICD-10-CM | POA: Insufficient documentation

## 2020-12-08 DIAGNOSIS — E785 Hyperlipidemia, unspecified: Secondary | ICD-10-CM | POA: Insufficient documentation

## 2020-12-08 MED ORDER — POTASSIUM CHLORIDE CRYS ER 20 MEQ PO TBCR: 20.0000 meq | EXTENDED_RELEASE_TABLET | Freq: Every day | ORAL | 0 refills | Status: AC

## 2020-12-08 MED ORDER — METOPROLOL TARTRATE 25 MG PO TABS: 25.0000 mg | ORAL_TABLET | Freq: Two times a day (BID) | ORAL | 0 refills | Status: DC

## 2020-12-08 MED ORDER — ATORVASTATIN CALCIUM 80 MG PO TABS: 80.0000 mg | ORAL_TABLET | Freq: Every day | ORAL | 0 refills | Status: DC

## 2020-12-08 MED ORDER — AMLODIPINE BESYLATE 10 MG PO TABS: 10.0000 mg | ORAL_TABLET | Freq: Every day | ORAL | 0 refills | Status: DC

## 2020-12-08 MED ORDER — HYDROCHLOROTHIAZIDE 25 MG PO TABS: 25.0000 mg | ORAL_TABLET | Freq: Every day | ORAL | 0 refills | Status: DC

## 2020-12-08 NOTE — Telephone Encounter (Signed)
Telephone Call:  SLP called Pt this date to inquire about SLP needs (Pt was evaluated by SLP, treated, and discharged with no further recommendations) and to remind him of upcoming appointments. Pt stated that he does not need SLP services and would like to cancel his evaluation. He states his primary need is OT. Pt missed his OT appointment yesterday. SLP reminded him of appointment with Dr. Wynn Banker today and his OT appointment tomorrow. He stated that he would be at both. Will cancel SLP appointment next week per Pt request.  Thank you,  Havery Moros, CCC-SLP [phone]

## 2020-12-09 ENCOUNTER — Other Ambulatory Visit: Payer: Self-pay

## 2020-12-09 ENCOUNTER — Encounter (HOSPITAL_COMMUNITY): Payer: Self-pay | Admitting: Occupational Therapy

## 2020-12-09 ENCOUNTER — Ambulatory Visit (HOSPITAL_COMMUNITY): Payer: BC Managed Care – PPO | Attending: Physician Assistant | Admitting: Occupational Therapy

## 2020-12-09 DIAGNOSIS — M6281 Muscle weakness (generalized): Secondary | ICD-10-CM | POA: Insufficient documentation

## 2020-12-09 DIAGNOSIS — R600 Localized edema: Secondary | ICD-10-CM | POA: Diagnosis present

## 2020-12-09 DIAGNOSIS — R2681 Unsteadiness on feet: Secondary | ICD-10-CM | POA: Insufficient documentation

## 2020-12-09 DIAGNOSIS — R262 Difficulty in walking, not elsewhere classified: Secondary | ICD-10-CM | POA: Diagnosis present

## 2020-12-09 DIAGNOSIS — R278 Other lack of coordination: Secondary | ICD-10-CM | POA: Diagnosis present

## 2020-12-09 DIAGNOSIS — I69354 Hemiplegia and hemiparesis following cerebral infarction affecting left non-dominant side: Secondary | ICD-10-CM | POA: Diagnosis present

## 2020-12-09 DIAGNOSIS — R29818 Other symptoms and signs involving the nervous system: Secondary | ICD-10-CM | POA: Insufficient documentation

## 2020-12-09 NOTE — Therapy (Signed)
[facility] [address] [place], [place], [address] Phone: [phone]   Fax:  [phone]  Occupational Therapy Treatment  Patient Details  Name: Darryl Sullivan MRN: [id number] Date of Birth: July 02, 1965 Referring Provider (OT): Dr. Claudette Laws   Encounter Date: 12/09/2020   OT End of Session - 12/09/20 1632     Visit Number 2    Number of Visits 16    Date for OT Re-Evaluation 01/31/21    Authorization Type BCBS PPO    Authorization Time Period 20 visit limit OT and ST, putting 10 as our limit for OT for now. (PT has 30 separate visits)    Authorization - Visit Number 2    Authorization - Number of Visits 10    OT Start Time 1441   pt arrived late   OT Stop Time 1514    OT Time Calculation (min) 33 min    Activity Tolerance Patient tolerated treatment well    Behavior During Therapy WFL for tasks assessed/performed             Past Medical History:  Diagnosis Date   Hypertension     History reviewed. No pertinent surgical history.  There were no vitals filed for this visit.   Subjective Assessment - 12/09/20 1444     Subjective  S: I'm having some pain in my shoulder.    Currently in Pain? Yes    Pain Score 5     Pain Location Shoulder    Pain Orientation Left    Pain Descriptors / Indicators Shooting;Sharp    Pain Type Acute pain    Pain Radiating Towards upper arm    Pain Onset More than a month ago    Pain Frequency Intermittent    Aggravating Factors  movement out of synergy pattern    Pain Relieving Factors no movement    Effect of Pain on Daily Activities max effect on ADLs    Multiple Pain Sites No                          OT Treatments/Exercises (OP) - 12/09/20 1445       Neurological Re-education Exercises   Shoulder Flexion PROM;5 reps;Self ROM;10 reps;Supine    Shoulder Protraction PROM;5 reps;Self ROM;10 reps;Supine    Shoulder External Rotation PROM;5 reps    Shoulder  Internal Rotation PROM;5 reps    Elbow Flexion PROM;5 reps;Self ROM;10 reps;Seated    Elbow Extension PROM;5 reps;Self ROM;10 reps;Seated    Forearm Supination PROM;5 reps    Forearm Pronation PROM;5 reps    Wrist Flexion PROM;5 reps    Wrist Extension PROM;5 reps    Weight Bearing Position Seated    Seated with weight on forearm Pt seated on mat table, leaning to left with weight on forearm, holding for 1'. 2X    Seated with weight on hand attempted, unable due to pain in wrist                    OT Education - 12/09/20 1635     Education Details provided medium sized edema glove for his left hand-educated on wearing at all times, removing for hygiene, explained contraindications    Person(s) Educated Patient    Methods Explanation    Comprehension Verbalized understanding              OT Short Term Goals - 12/09/20 1636  OT SHORT TERM GOAL #1   Title Patient will be educated on HEP for improved mobility of LUE and independence with ADLs.    Time 4    Period Weeks    Status On-going    Target Date 01/01/21      OT SHORT TERM GOAL #2   Title Patient will improve independence with UE dressing to Independent.    Time 4    Period Weeks    Status On-going      OT SHORT TERM GOAL #3   Title Patient will decrease pain with p/rom of his LUE to 4/10 or less by decreasing subluxation and swelling in his LUE.    Time 4    Period Weeks    Status On-going      OT SHORT TERM GOAL #4   Title Patient will improve a/rom of LUE by 25% in order to use his LUE as a gross assist with daily tasks.    Time 4    Period Weeks    Status On-going      OT SHORT TERM GOAL #5   Title Patient will be able to weightbear on his LUE during transfers and functional tasks with min facilitation.    Time 4    Period Weeks    Status On-going               OT Long Term Goals - 12/09/20 1636       OT LONG TERM GOAL #1   Title Patient will use his LUE as an active assist  or actively with all desired activities.    Time 8    Period Weeks    Status On-going      OT LONG TERM GOAL #2   Title Patient will improve a/rom outside of synergy pattern to 75% range or better for improved ability to complete functional tasks.    Time 8    Period Weeks    Status On-going      OT LONG TERM GOAL #3   Title Patient will have functional grasp and release in his left hand in order to hold utensils, cups, etc.    Time 8    Period Weeks    Status On-going      OT LONG TERM GOAL #4   Title Patient will improve left hand fine motor coordination in order to use his left hand actively to tie shoes or fasten closures on clothing.    Time 8    Period Weeks    Status On-going      OT LONG TERM GOAL #5   Title Patient will have 3+/5 or better strength in his left arm in order to use left arm actively during daily tasks.    Time 8    Period Weeks    Status On-going                   Plan - 12/09/20 1633     Clinical Impression Statement A: Pt arrived late to session, therefore shortened session completed. Pt reports pain in his shoulder on arrival, weightbearing and passive ROM completed with improvement in shoulder pain. Completed passive stretching and self-ROM to LUE this session. Pt reports he is completing HEP at home. Also provided medium sized edema glove and fitted for pt as OT observed his large size was too big and ineffective.    Body Structure / Function / Physical Skills ADL;UE functional use;Pain;FMC;ROM;GMC;IADL;Tone;Edema;Dexterity    Plan P: Follow up  on edema glove usage at home, continue with weightbearing tasks, completing task in standing with forearm on tabletop and reaching with RUE if pt able to tolerate    OT Home Exercise Plan eval:  edema control, prom    Consulted and Agree with Plan of Care Patient             Patient will benefit from skilled therapeutic intervention in order to improve the following deficits and impairments:    Body Structure / Function / Physical Skills: ADL, UE functional use, Pain, FMC, ROM, GMC, IADL, Tone, Edema, Dexterity       Visit Diagnosis: Hemiplegia and hemiparesis following cerebral infarction affecting left non-dominant side (HCC)  Other lack of coordination  Localized edema  Other symptoms and signs involving the nervous system    Problem List Patient Active Problem List   Diagnosis Date Noted   Impingement syndrome of left shoulder 10/10/2020   Stroke of right basal ganglia (HCC) 09/14/2020   Dyslipidemia    Stage 3 chronic kidney disease (HCC)    Essential hypertension    Acute pain of left shoulder    Tobacco abuse    Hypertensive emergency    Cerebrovascular accident (CVA) (HCC) 09/05/2020    Ezra Sites, OTR/L  [phone] 12/09/2020, 4:36 PM  [facility] [address] [place], [place], [address] Phone: [phone]   Fax:  [phone]  Name: Darryl Sullivan MRN: [id number] Date of Birth: 1966/05/22

## 2020-12-13 ENCOUNTER — Ambulatory Visit: Payer: BC Managed Care – PPO

## 2020-12-13 ENCOUNTER — Encounter: Payer: BC Managed Care – PPO | Admitting: Speech Pathology

## 2020-12-13 ENCOUNTER — Encounter: Payer: BC Managed Care – PPO | Admitting: Occupational Therapy

## 2020-12-13 ENCOUNTER — Ambulatory Visit (HOSPITAL_COMMUNITY): Payer: BC Managed Care – PPO

## 2020-12-13 ENCOUNTER — Encounter
Payer: BC Managed Care – PPO | Attending: Physical Medicine & Rehabilitation | Admitting: Physical Medicine & Rehabilitation

## 2020-12-13 DIAGNOSIS — N183 Chronic kidney disease, stage 3 unspecified: Secondary | ICD-10-CM | POA: Insufficient documentation

## 2020-12-13 DIAGNOSIS — I10 Essential (primary) hypertension: Secondary | ICD-10-CM | POA: Insufficient documentation

## 2020-12-13 DIAGNOSIS — E785 Hyperlipidemia, unspecified: Secondary | ICD-10-CM | POA: Insufficient documentation

## 2020-12-14 ENCOUNTER — Ambulatory Visit (HOSPITAL_COMMUNITY): Payer: BC Managed Care – PPO | Admitting: Physical Therapy

## 2020-12-14 ENCOUNTER — Ambulatory Visit (HOSPITAL_COMMUNITY): Payer: BC Managed Care – PPO | Admitting: Speech Pathology

## 2020-12-15 ENCOUNTER — Encounter: Payer: BC Managed Care – PPO | Admitting: Occupational Therapy

## 2020-12-15 ENCOUNTER — Encounter: Payer: BC Managed Care – PPO | Admitting: Speech Pathology

## 2020-12-16 ENCOUNTER — Ambulatory Visit (HOSPITAL_COMMUNITY): Payer: BC Managed Care – PPO | Admitting: Occupational Therapy

## 2020-12-20 ENCOUNTER — Ambulatory Visit: Payer: BC Managed Care – PPO

## 2020-12-20 ENCOUNTER — Telehealth (HOSPITAL_COMMUNITY): Payer: Self-pay | Admitting: Occupational Therapy

## 2020-12-20 ENCOUNTER — Ambulatory Visit (HOSPITAL_COMMUNITY): Payer: BC Managed Care – PPO | Admitting: Physical Therapy

## 2020-12-20 ENCOUNTER — Ambulatory Visit (HOSPITAL_COMMUNITY): Payer: BC Managed Care – PPO | Admitting: Occupational Therapy

## 2020-12-20 ENCOUNTER — Encounter: Payer: BC Managed Care – PPO | Admitting: Speech Pathology

## 2020-12-20 ENCOUNTER — Encounter: Payer: BC Managed Care – PPO | Admitting: Occupational Therapy

## 2020-12-20 NOTE — Telephone Encounter (Signed)
Called pt regarding no shows for 7/8 and 7/12. Pt reports his ride had something to go do this morning and that he called to let us know. Reminded pt of appt on 7/14 at 2:30 and pt reports he should be here. Requested pt call if unable to attend.   Darryl Sullivan, OTR/L  [phone] 12/20/2020

## 2020-12-22 ENCOUNTER — Ambulatory Visit: Payer: BC Managed Care – PPO

## 2020-12-22 ENCOUNTER — Other Ambulatory Visit: Payer: Self-pay

## 2020-12-22 ENCOUNTER — Encounter (HOSPITAL_COMMUNITY): Payer: Self-pay | Admitting: Occupational Therapy

## 2020-12-22 ENCOUNTER — Ambulatory Visit (HOSPITAL_COMMUNITY): Payer: BC Managed Care – PPO | Admitting: Physical Therapy

## 2020-12-22 ENCOUNTER — Encounter: Payer: BC Managed Care – PPO | Admitting: Speech Pathology

## 2020-12-22 ENCOUNTER — Encounter: Payer: BC Managed Care – PPO | Admitting: Occupational Therapy

## 2020-12-22 ENCOUNTER — Ambulatory Visit (HOSPITAL_COMMUNITY): Payer: BC Managed Care – PPO | Admitting: Occupational Therapy

## 2020-12-22 DIAGNOSIS — R278 Other lack of coordination: Secondary | ICD-10-CM

## 2020-12-22 DIAGNOSIS — I69354 Hemiplegia and hemiparesis following cerebral infarction affecting left non-dominant side: Secondary | ICD-10-CM | POA: Diagnosis not present

## 2020-12-22 DIAGNOSIS — R29818 Other symptoms and signs involving the nervous system: Secondary | ICD-10-CM

## 2020-12-22 NOTE — Therapy (Signed)
[facility] [address] [place], [place], [address] Phone: [phone]   Fax:  [phone]  Occupational Therapy Treatment  Patient Details  Name: Darryl Sullivan MRN: [id number] Date of Birth: 05-Oct-1965 Referring Provider (OT): Dr. Claudette Laws   Encounter Date: 12/22/2020   OT End of Session - 12/22/20 1525     Visit Number 3    Number of Visits 16    Date for OT Re-Evaluation 01/31/21    Authorization Type BCBS PPO    Authorization Time Period 20 visit limit OT and ST, putting 10 as our limit for OT for now. (PT has 30 separate visits)    Authorization - Visit Number 3    Authorization - Number of Visits 10    OT Start Time 1438   pt arrived late   OT Stop Time 1522    OT Time Calculation (min) 44 min    Activity Tolerance Patient tolerated treatment well    Behavior During Therapy WFL for tasks assessed/performed             Past Medical History:  Diagnosis Date   Hypertension     History reviewed. No pertinent surgical history.  There were no vitals filed for this visit.   Subjective Assessment - 12/22/20 1441     Subjective  S: It hurts when it gets cloudy outside.    Currently in Pain? No/denies                          OT Treatments/Exercises (OP) - 12/22/20 1441       Neurological Re-education Exercises   Shoulder Flexion PROM;5 reps;Self ROM;10 reps;Supine    Shoulder Protraction PROM;5 reps;Self ROM;10 reps;Supine    Shoulder External Rotation PROM;5 reps    Shoulder Internal Rotation PROM;5 reps    Elbow Flexion PROM;5 reps;Self ROM;10 reps;Seated    Elbow Extension PROM;5 reps;Self ROM;10 reps;Seated    Forearm Supination PROM;5 reps    Forearm Pronation PROM;5 reps    Wrist Flexion PROM;5 reps    Wrist Extension PROM;5 reps    Weight Bearing Position Seated    Seated with weight on hand Pt seated on mat table, leaning into hand placed at edge of mat table, 2x, 1' holds. OT  providing mod assist at hand to prevent sliding and elbow to prevent buckling.      Manual Therapy   Manual Therapy Other (comment)    Manual therapy comments completed separately from therapeutic exercises    Other Manual Therapy Massage gun utilized on level one to address fascial restrictions in left upper arm, anterior shoulder, and upper trapezius regions to decrease pain and fascial restrictions and increase joint ROM                    OT Education - 12/22/20 1525     Education Details provided self-ROM packet for UE    Person(s) Educated Patient    Methods Explanation;Demonstration;Handout    Comprehension Verbalized understanding;Returned demonstration              OT Short Term Goals - 12/09/20 1636       OT SHORT TERM GOAL #1   Title Patient will be educated on HEP for improved mobility of LUE and independence with ADLs.    Time 4    Period Weeks    Status On-going    Target Date 01/01/21      OT  SHORT TERM GOAL #2   Title Patient will improve independence with UE dressing to Independent.    Time 4    Period Weeks    Status On-going      OT SHORT TERM GOAL #3   Title Patient will decrease pain with p/rom of his LUE to 4/10 or less by decreasing subluxation and swelling in his LUE.    Time 4    Period Weeks    Status On-going      OT SHORT TERM GOAL #4   Title Patient will improve a/rom of LUE by 25% in order to use his LUE as a gross assist with daily tasks.    Time 4    Period Weeks    Status On-going      OT SHORT TERM GOAL #5   Title Patient will be able to weightbear on his LUE during transfers and functional tasks with min facilitation.    Time 4    Period Weeks    Status On-going               OT Long Term Goals - 12/09/20 1636       OT LONG TERM GOAL #1   Title Patient will use his LUE as an active assist or actively with all desired activities.    Time 8    Period Weeks    Status On-going      OT LONG TERM GOAL #2    Title Patient will improve a/rom outside of synergy pattern to 75% range or better for improved ability to complete functional tasks.    Time 8    Period Weeks    Status On-going      OT LONG TERM GOAL #3   Title Patient will have functional grasp and release in his left hand in order to hold utensils, cups, etc.    Time 8    Period Weeks    Status On-going      OT LONG TERM GOAL #4   Title Patient will improve left hand fine motor coordination in order to use his left hand actively to tie shoes or fasten closures on clothing.    Time 8    Period Weeks    Status On-going      OT LONG TERM GOAL #5   Title Patient will have 3+/5 or better strength in his left arm in order to use left arm actively during daily tasks.    Time 8    Period Weeks    Status On-going                   Plan - 12/22/20 1507     Clinical Impression Statement A: Pt reports he has been completing his HEP at home. Continued with weightbearing today, pt able to tolerate weightbearing on his hand versus forearm today. Continued with self-ROM for LUE and updated HEP. Initiated manual therapy using massage gun to address fascial restrictions in RUE, great vasomotor response with noticable lessening of fascial restrictions. Verbal cuing for form and technique during exercises. Educated pt on removing edema glove to wash and lotion hand.    Body Structure / Function / Physical Skills ADL;UE functional use;Pain;FMC;ROM;GMC;IADL;Tone;Edema;Dexterity    Plan P: Follow up on HEP, continue with manual techniques as needed, weightbearing on hand. Begin development of reach activities-sliding cones across table    OT Home Exercise Plan eval:  edema control, prom; 7/14: self-ROM    Consulted and Agree with Plan of  Care Patient             Patient will benefit from skilled therapeutic intervention in order to improve the following deficits and impairments:   Body Structure / Function / Physical Skills: ADL, UE  functional use, Pain, FMC, ROM, GMC, IADL, Tone, Edema, Dexterity       Visit Diagnosis: Hemiplegia and hemiparesis following cerebral infarction affecting left non-dominant side (HCC)  Other lack of coordination  Other symptoms and signs involving the nervous system    Problem List Patient Active Problem List   Diagnosis Date Noted   Impingement syndrome of left shoulder 10/10/2020   Stroke of right basal ganglia (HCC) 09/14/2020   Dyslipidemia    Stage 3 chronic kidney disease (HCC)    Essential hypertension    Acute pain of left shoulder    Tobacco abuse    Hypertensive emergency    Cerebrovascular accident (CVA) (HCC) 09/05/2020    Ezra Sites, OTR/L  [phone] 12/22/2020, 3:29 PM  [facility] [address] [place], [place], [address] Phone: [phone]   Fax:  [phone]  Name: Darryl Sullivan MRN: [id number] Date of Birth: August 14, 1965

## 2020-12-27 ENCOUNTER — Ambulatory Visit (HOSPITAL_COMMUNITY): Payer: BC Managed Care – PPO

## 2020-12-27 ENCOUNTER — Ambulatory Visit: Payer: BC Managed Care – PPO

## 2020-12-27 ENCOUNTER — Encounter (HOSPITAL_COMMUNITY): Payer: Self-pay

## 2020-12-27 ENCOUNTER — Other Ambulatory Visit: Payer: Self-pay

## 2020-12-27 ENCOUNTER — Encounter: Payer: BC Managed Care – PPO | Admitting: Occupational Therapy

## 2020-12-27 ENCOUNTER — Encounter: Payer: BC Managed Care – PPO | Admitting: Speech Pathology

## 2020-12-27 DIAGNOSIS — M6281 Muscle weakness (generalized): Secondary | ICD-10-CM

## 2020-12-27 DIAGNOSIS — R262 Difficulty in walking, not elsewhere classified: Secondary | ICD-10-CM

## 2020-12-27 DIAGNOSIS — I69354 Hemiplegia and hemiparesis following cerebral infarction affecting left non-dominant side: Secondary | ICD-10-CM | POA: Diagnosis not present

## 2020-12-27 DIAGNOSIS — R278 Other lack of coordination: Secondary | ICD-10-CM

## 2020-12-27 DIAGNOSIS — R29818 Other symptoms and signs involving the nervous system: Secondary | ICD-10-CM

## 2020-12-27 DIAGNOSIS — R600 Localized edema: Secondary | ICD-10-CM

## 2020-12-27 DIAGNOSIS — R2681 Unsteadiness on feet: Secondary | ICD-10-CM

## 2020-12-27 NOTE — Therapy (Signed)
[facility] [address] [place], [place], [address] Phone: [phone]   Fax:  [phone]  Occupational Therapy Treatment  Patient Details  Name: Darryl Sullivan MRN: [id number] Date of Birth: 12/29/1965 Referring Provider (OT): Dr. Claudette Laws  Encounter Date: 12/27/2020   OT End of Session - 12/27/20 1555     Visit Number 4    Number of Visits 16    Date for OT Re-Evaluation 01/31/21    Authorization Type BCBS PPO    Authorization Time Period 20 visit limit OT and ST, putting 10 as our limit for OT for now. (PT has 30 separate visits)    Authorization - Visit Number 4    Authorization - Number of Visits 10    OT Start Time 1430    OT Stop Time 1515    OT Time Calculation (min) 45 min    Activity Tolerance Patient tolerated treatment well    Behavior During Therapy WFL for tasks assessed/performed             Past Medical History:  Diagnosis Date   Hypertension     History reviewed. No pertinent surgical history.  There were no vitals filed for this visit.   Subjective Assessment - 12/27/20 1545     Subjective  S: Nothing new to report.    Currently in Pain? Yes    Pain Score 3     Pain Location Shoulder    Pain Orientation Left    Pain Descriptors / Indicators Aching;Sore;Constant    Pain Type Acute pain    Pain Onset More than a month ago    Pain Frequency --                [facility] OT Assessment - 12/27/20 1548       Assessment   Medical Diagnosis S/P R CVA with LUE hemiparesis      Precautions   Precautions Fall                      OT Treatments/Exercises (OP) - 12/27/20 1548       ADLs   Grooming Patient required total assist for grooming task of washing left hand using soapy washcloth, rinse washcloth, and towel to dry. Removed significant amount of dry skin with beginning stages of skin breakdown noted in the webspace of all fingers except thumb.    ADL Comments Provided  new Medium edema glove for left hand.      Exercises   Exercises Shoulder      Neurological Re-education Exercises   Shoulder Flexion PROM;5 reps;Supine    Shoulder External Rotation PROM;5 reps;Supine    Shoulder Internal Rotation PROM;5 reps;Supine    Elbow Extension PROM;5 reps;Supine    Forearm Supination PROM;5 reps;Supine    Forearm Pronation PROM;5 reps;Supine      Manual Therapy   Manual Therapy Other (comment)    Manual therapy comments completed separately from therapeutic exercises    Other Manual Therapy Massage gun utilized on level one to address fascial restrictions in left upper arm, anterior shoulder, and upper trapezius regions to decrease pain and fascial restrictions and increase joint ROM                    OT Education - 12/27/20 1556     Education Details Discussed washing left hand and scrubbing dead skin off of the palm and back of hand using washcloth in shower. Discussed the  start of skin breakdown due to increased moisture in between his fingers.    Person(s) Educated Patient    Methods Explanation    Comprehension Verbalized understanding              OT Short Term Goals - 12/09/20 1636       OT SHORT TERM GOAL #1   Title Patient will be educated on HEP for improved mobility of LUE and independence with ADLs.    Time 4    Period Weeks    Status On-going    Target Date 01/01/21      OT SHORT TERM GOAL #2   Title Patient will improve independence with UE dressing to Independent.    Time 4    Period Weeks    Status On-going      OT SHORT TERM GOAL #3   Title Patient will decrease pain with p/rom of his LUE to 4/10 or less by decreasing subluxation and swelling in his LUE.    Time 4    Period Weeks    Status On-going      OT SHORT TERM GOAL #4   Title Patient will improve a/rom of LUE by 25% in order to use his LUE as a gross assist with daily tasks.    Time 4    Period Weeks    Status On-going      OT SHORT TERM GOAL #5    Title Patient will be able to weightbear on his LUE during transfers and functional tasks with min facilitation.    Time 4    Period Weeks    Status On-going               OT Long Term Goals - 12/09/20 1636       OT LONG TERM GOAL #1   Title Patient will use his LUE as an active assist or actively with all desired activities.    Time 8    Period Weeks    Status On-going      OT LONG TERM GOAL #2   Title Patient will improve a/rom outside of synergy pattern to 75% range or better for improved ability to complete functional tasks.    Time 8    Period Weeks    Status On-going      OT LONG TERM GOAL #3   Title Patient will have functional grasp and release in his left hand in order to hold utensils, cups, etc.    Time 8    Period Weeks    Status On-going      OT LONG TERM GOAL #4   Title Patient will improve left hand fine motor coordination in order to use his left hand actively to tie shoes or fasten closures on clothing.    Time 8    Period Weeks    Status On-going      OT LONG TERM GOAL #5   Title Patient will have 3+/5 or better strength in his left arm in order to use left arm actively during daily tasks.    Time 8    Period Weeks    Status On-going                   Plan - 12/27/20 1557     Clinical Impression Statement A: Manual techniques using massage gun to decrease fascial restrictions to the left shoulder and increase ROM. Session focused on importance of grooming related to the left hand due to large  amount of dry skin noted. Edema remains present and per patient does not seem to be reducing. Edema glove was replaced as it seems to not be providing the appropriate amount of compression needed to reduce. OT will order Exo Soft edema glove to assess performance with reducing.    Body Structure / Function / Physical Skills ADL;UE functional use;Pain;FMC;ROM;GMC;IADL;Tone;Edema;Dexterity    Plan P: REASSESS. Follow up on hand washing at home.  Kinsiotape to left shoulder to help decrease subluxation and provide increased joint stability. Begin development of reach activities - sliding cones across table.    Consulted and Agree with Plan of Care Patient             Patient will benefit from skilled therapeutic intervention in order to improve the following deficits and impairments:   Body Structure / Function / Physical Skills: ADL, UE functional use, Pain, FMC, ROM, GMC, IADL, Tone, Edema, Dexterity       Visit Diagnosis: Other symptoms and signs involving the nervous system  Other lack of coordination  Localized edema    Problem List Patient Active Problem List   Diagnosis Date Noted   Impingement syndrome of left shoulder 10/10/2020   Stroke of right basal ganglia (HCC) 09/14/2020   Dyslipidemia    Stage 3 chronic kidney disease (HCC)    Essential hypertension    Acute pain of left shoulder    Tobacco abuse    Hypertensive emergency    Cerebrovascular accident (CVA) (HCC) 09/05/2020    Limmie Patricia, OTR/L,CBIS  [phone]   12/27/2020, 4:03 PM  [facility] [address] [place], [place], [address] Phone: [phone]   Fax:  [phone]  Name: ZORAN YANKEE MRN: [id number] Date of Birth: December 12, 1965

## 2020-12-27 NOTE — Therapy (Signed)
[facility] [address] [place], [place], [address] Phone: [phone]   Fax:  [phone]  Physical Therapy Evaluation  Patient Details  Name: Darryl Sullivan MRN: [id number] Date of Birth: 1966-02-06 No data recorded  Encounter Date: 12/27/2020   PT End of Session - 12/27/20 1703     Visit Number 1    Number of Visits 12    Date for PT Re-Evaluation 02/07/21    Authorization Type [employer] PPO, no auth 30 VL    Authorization - Visit Number 1    Authorization - Number of Visits 30    Progress Note Due on Visit 10    PT Start Time 1515    PT Stop Time 1600    PT Time Calculation (min) 45 min    Equipment Utilized During Treatment Gait belt    Activity Tolerance Patient tolerated treatment well    Behavior During Therapy WFL for tasks assessed/performed             Past Medical History:  Diagnosis Date   Hypertension     No past surgical history on file.  There were no vitals filed for this visit.    Subjective Assessment - 12/27/20 1522     Subjective Stroke affecting right basal ganglia on September 05, 2020 presenting with left hemiparesis, LLE weakness, difficulty walking, coordination/motor control deficits    Currently in Pain? No/denies    Pain Score 0-No pain                [facility] PT Assessment - 12/27/20 0001       Assessment   Medical Diagnosis S/P R CVA with LUE hemiparesis    Onset Date/Surgical Date 09/05/20    Hand Dominance Right    Next MD Visit 12/08/20    Prior Therapy Acute, CIR      Precautions   Precautions Fall      Home Environment   Living Environment Private residence    Living Arrangements Other relatives    Available Help at Discharge Family    Type of Home House    Home Access Level entry    Home Layout One level    Home Equipment --   HW     Prior Function   Level of Independence Requires assistive device for independence;Needs assistance with ADLs;Needs assistance with  homemaking    Vocation Full time employment    Vocation Requirements worked for [employer], has not returned to work    Leisure spending time with his grandchild      Observation/Other Assessments   Focus on Therapeutic Outcomes (FOTO)  65% function      Sensation   Light Touch Appears Intact      Coordination   Heel Shin Test impaired      ROM / Strength   AROM / PROM / Strength Strength      Strength   Strength Assessment Site Hip;Knee;Ankle    Right/Left Hip Left    Left Hip Flexion 3+/5    Left Hip Extension 3-/5    Left Hip ABduction 3-/5    Left Hip ADduction 3/5    Right/Left Knee Left    Left Knee Flexion 3+/5    Left Knee Extension 3+/5    Right/Left Ankle Left    Left Ankle Dorsiflexion 3-/5    Left Ankle Plantar Flexion 3-/5      Transfers   Five time sit to stand comments  21  sec    Comments decreased weight shift LLE      Ambulation/Gait   Ambulation/Gait Yes    Ambulation/Gait Assistance 5: Supervision    Ambulation Distance (Feet) 80 Feet    Assistive device Hemi-walker    Gait Pattern Decreased arm swing - left;Decreased stance time - left;Decreased stride length;Decreased dorsiflexion - left;Decreased weight shift to left    Ambulation Surface Level;Indoor    Gait velocity decreased    Stairs Yes    Stairs Assistance 4: Min guard    Number of Stairs 1    Gait Comments      Standardized Balance Assessment   Standardized Balance Assessment Timed Up and Go Test      Timed Up and Go Test   Normal TUG (seconds) 55   w/ HW                       Objective measurements completed on examination: See above findings.               PT Education - 12/27/20 1701     Education Details education on assessment findings and PT tx rationale    Person(s) Educated Patient    Methods Explanation    Comprehension Verbalized understanding              PT Short Term Goals - 12/27/20 1707       PT SHORT TERM GOAL  #1   Title Patient will be independent with HEP in order to improve functional outcomes.    Time 3    Period Weeks    Status New    Target Date 01/17/21      PT SHORT TERM GOAL #2   Title Decrease risk for falls as evidenced by time of 25 sec TUG test    Baseline 55 sec with HW    Time 3    Period Weeks    Status New    Target Date 01/17/21      PT SHORT TERM GOAL #3   Title Demonstrate improved BLE strength and balance as evidenced by time of 18 sec 5xSTS test    Baseline 21 sec with decreased weight shift LLE    Time 3    Period Weeks    Status New    Target Date 01/17/21               PT Long Term Goals - 12/27/20 1708       PT LONG TERM GOAL #1   Title Patient will improve FOTO score by at least 5 points in order to indicate improved tolerance to activity.    Baseline 63% function    Time 6    Period Weeks    Status New    Target Date 02/07/21      PT LONG TERM GOAL #2   Title Demonstrate decreased risk for falls as evidenced by time of 20 sec TUG test    Baseline 55 sec with HW    Time 6    Period Weeks    Status New    Target Date 02/07/21      PT LONG TERM GOAL #3   Title Demo improved gait velocity as evidenced by distance of 150 ft during    Baseline 80 ft with HW and supervision    Time 6    Period Weeks    Status New    Target Date 02/07/21  Plan - 12/27/20 1704     Clinical Impression Statement Pt is 55 yo male who presents with left hemiparesis following CVA in March 2022 and demonstrates LLE weakness, motor control/coordination deficits, difficulty in walking, high risk for falls, reduced ability to safely ambulate, and balance deficits indicating need for PT services to improve functional mobility, reduce risk for falls, and facilitate ambulation without AD    Personal Factors and Comorbidities Comorbidity 1;Time since onset of injury/illness/exacerbation    Comorbidities see PMH    Examination-Activity  Limitations Lift;Stand;Stairs;Squat;Locomotion Level;Transfers;Bed Mobility;Carry    Examination-Participation Restrictions Cleaning;Community Activity;Driving;Laundry;Yard Work;Occupation;Meal Prep    Stability/Clinical Decision Making Evolving/Moderate complexity    Clinical Decision Making Moderate    Rehab Potential Good    PT Frequency 2x / week    PT Duration 6 weeks    PT Treatment/Interventions ADLs/Self Care Home Management;Electrical Stimulation;Gait training;Functional mobility training;Therapeutic activities;Therapeutic exercise;Balance training;Patient/family education;Neuromuscular re-education;Manual techniques;Splinting;Taping;Spinal Manipulations;Joint Manipulations    PT Next Visit Plan Body weight support treadmill training    Consulted and Agree with Plan of Care Patient             Patient will benefit from skilled therapeutic intervention in order to improve the following deficits and impairments:  Abnormal gait, Decreased balance, Decreased mobility, Decreased knowledge of use of DME, Decreased coordination, Decreased strength, Difficulty walking, Improper body mechanics, Impaired UE functional use  Visit Diagnosis: Difficulty in walking, not elsewhere classified  Muscle weakness (generalized)  Unsteadiness on feet     Problem List Patient Active Problem List   Diagnosis Date Noted   Impingement syndrome of left shoulder 10/10/2020   Stroke of right basal ganglia (HCC) 09/14/2020   Dyslipidemia    Stage 3 chronic kidney disease (HCC)    Essential hypertension    Acute pain of left shoulder    Tobacco abuse    Hypertensive emergency    Cerebrovascular accident (CVA) (HCC) 09/05/2020   5:12 PM, 12/27/20 M. Shary Decamp, PT, DPT Physical Therapist- [place] Office Number: [phone]   [facility] [address] [place], [place], [address] Phone: [phone]   Fax:  [phone]  Name: Darryl Sullivan MRN: [id number] Date of Birth: 11/16/1965

## 2020-12-29 ENCOUNTER — Encounter: Payer: BC Managed Care – PPO | Admitting: Speech Pathology

## 2020-12-29 ENCOUNTER — Ambulatory Visit: Payer: BC Managed Care – PPO

## 2020-12-29 ENCOUNTER — Ambulatory Visit (HOSPITAL_COMMUNITY): Payer: BC Managed Care – PPO | Admitting: Physical Therapy

## 2020-12-29 ENCOUNTER — Encounter (HOSPITAL_COMMUNITY): Payer: BC Managed Care – PPO | Admitting: Occupational Therapy

## 2021-01-02 ENCOUNTER — Ambulatory Visit: Payer: BC Managed Care – PPO | Admitting: Diagnostic Neuroimaging

## 2021-01-03 ENCOUNTER — Ambulatory Visit: Payer: BC Managed Care – PPO

## 2021-01-03 ENCOUNTER — Ambulatory Visit (HOSPITAL_COMMUNITY): Payer: BC Managed Care – PPO | Admitting: Physical Therapy

## 2021-01-03 ENCOUNTER — Telehealth (HOSPITAL_COMMUNITY): Payer: Self-pay | Admitting: Occupational Therapy

## 2021-01-03 ENCOUNTER — Encounter: Payer: BC Managed Care – PPO | Admitting: Occupational Therapy

## 2021-01-03 ENCOUNTER — Encounter: Payer: BC Managed Care – PPO | Admitting: Speech Pathology

## 2021-01-03 ENCOUNTER — Ambulatory Visit (HOSPITAL_COMMUNITY): Payer: BC Managed Care – PPO | Admitting: Occupational Therapy

## 2021-01-03 NOTE — Telephone Encounter (Signed)
Attempted to call pt regarding no shows on 7/21 and 7/26 for OT services. Phone rang twice and then went to a busy/disconnected signal. Unable to leave message.    Ezra Sites, OTR/L  [phone] 01/03/2021

## 2021-01-05 ENCOUNTER — Ambulatory Visit: Payer: BC Managed Care – PPO

## 2021-01-05 ENCOUNTER — Ambulatory Visit (HOSPITAL_COMMUNITY): Payer: BC Managed Care – PPO | Admitting: Occupational Therapy

## 2021-01-05 ENCOUNTER — Ambulatory Visit (HOSPITAL_COMMUNITY): Payer: BC Managed Care – PPO | Admitting: Physical Therapy

## 2021-01-05 ENCOUNTER — Telehealth (HOSPITAL_COMMUNITY): Payer: Self-pay | Admitting: Occupational Therapy

## 2021-01-05 ENCOUNTER — Encounter: Payer: BC Managed Care – PPO | Admitting: Speech Pathology

## 2021-01-05 NOTE — Telephone Encounter (Signed)
Attempted to call pt regarding no show for 7/28 for both PT and OT appts. Pt has now had 3 consecutive no shows for OT and at least 2 consecutive no shows for PT. Pt's primary phone number [phone] rings twice and then goes to a busy signal. Alternate contact number [phone] rings and then recording states unable to complete the call with no voicemail option.   At this time remainder of pt appointments will be cancelled per attendance policy and lack of contact with pt, a letter sent to pt's address requesting calling the office for rescheduling or discharging.    Ezra Sites, OTR/L  [phone] 01/05/2021

## 2021-01-10 ENCOUNTER — Ambulatory Visit (HOSPITAL_COMMUNITY): Payer: BC Managed Care – PPO

## 2021-01-10 ENCOUNTER — Ambulatory Visit: Payer: BC Managed Care – PPO

## 2021-01-10 ENCOUNTER — Encounter: Payer: BC Managed Care – PPO | Admitting: Speech Pathology

## 2021-01-10 ENCOUNTER — Encounter: Payer: BC Managed Care – PPO | Admitting: Occupational Therapy

## 2021-01-12 ENCOUNTER — Encounter: Payer: BC Managed Care – PPO | Admitting: Speech Pathology

## 2021-01-12 ENCOUNTER — Ambulatory Visit (HOSPITAL_COMMUNITY): Payer: BC Managed Care – PPO | Admitting: Occupational Therapy

## 2021-01-12 ENCOUNTER — Ambulatory Visit (HOSPITAL_COMMUNITY): Payer: BC Managed Care – PPO | Admitting: Physical Therapy

## 2021-01-12 ENCOUNTER — Encounter: Payer: BC Managed Care – PPO | Admitting: Occupational Therapy

## 2021-01-12 ENCOUNTER — Ambulatory Visit: Payer: BC Managed Care – PPO

## 2021-01-17 ENCOUNTER — Encounter: Payer: BC Managed Care – PPO | Admitting: Occupational Therapy

## 2021-01-17 ENCOUNTER — Ambulatory Visit (HOSPITAL_COMMUNITY): Payer: BC Managed Care – PPO | Admitting: Physical Therapy

## 2021-01-17 ENCOUNTER — Ambulatory Visit: Payer: BC Managed Care – PPO

## 2021-01-17 ENCOUNTER — Encounter: Payer: BC Managed Care – PPO | Admitting: Speech Pathology

## 2021-01-17 ENCOUNTER — Encounter (HOSPITAL_COMMUNITY): Payer: BC Managed Care – PPO

## 2021-01-19 ENCOUNTER — Encounter: Payer: BC Managed Care – PPO | Admitting: Occupational Therapy

## 2021-01-19 ENCOUNTER — Ambulatory Visit (HOSPITAL_COMMUNITY): Payer: BC Managed Care – PPO | Admitting: Physical Therapy

## 2021-01-19 ENCOUNTER — Ambulatory Visit: Payer: BC Managed Care – PPO

## 2021-01-19 ENCOUNTER — Encounter: Payer: BC Managed Care – PPO | Admitting: Speech Pathology

## 2021-01-19 ENCOUNTER — Encounter (HOSPITAL_COMMUNITY): Payer: BC Managed Care – PPO | Admitting: Occupational Therapy

## 2021-01-24 ENCOUNTER — Encounter: Payer: BC Managed Care – PPO | Admitting: Speech Pathology

## 2021-01-24 ENCOUNTER — Encounter: Payer: BC Managed Care – PPO | Admitting: Occupational Therapy

## 2021-01-24 ENCOUNTER — Ambulatory Visit (HOSPITAL_COMMUNITY): Payer: BC Managed Care – PPO | Admitting: Physical Therapy

## 2021-01-24 ENCOUNTER — Encounter (HOSPITAL_COMMUNITY): Payer: BC Managed Care – PPO

## 2021-01-24 ENCOUNTER — Ambulatory Visit: Payer: BC Managed Care – PPO

## 2021-01-26 ENCOUNTER — Ambulatory Visit (HOSPITAL_COMMUNITY): Payer: BC Managed Care – PPO | Admitting: Physical Therapy

## 2021-01-26 ENCOUNTER — Encounter (HOSPITAL_COMMUNITY): Payer: BC Managed Care – PPO

## 2021-01-26 ENCOUNTER — Encounter: Payer: BC Managed Care – PPO | Admitting: Speech Pathology

## 2021-01-26 ENCOUNTER — Ambulatory Visit: Payer: BC Managed Care – PPO

## 2021-01-26 ENCOUNTER — Encounter: Payer: BC Managed Care – PPO | Admitting: Occupational Therapy

## 2021-01-31 ENCOUNTER — Encounter: Payer: BC Managed Care – PPO | Admitting: Occupational Therapy

## 2021-01-31 ENCOUNTER — Encounter (HOSPITAL_COMMUNITY): Payer: BC Managed Care – PPO | Admitting: Occupational Therapy

## 2021-01-31 ENCOUNTER — Ambulatory Visit (HOSPITAL_COMMUNITY): Payer: BC Managed Care – PPO | Admitting: Physical Therapy

## 2021-01-31 ENCOUNTER — Ambulatory Visit: Payer: BC Managed Care – PPO

## 2021-01-31 ENCOUNTER — Encounter: Payer: BC Managed Care – PPO | Admitting: Speech Pathology

## 2021-02-02 ENCOUNTER — Encounter: Payer: BC Managed Care – PPO | Admitting: Speech Pathology

## 2021-02-02 ENCOUNTER — Ambulatory Visit: Payer: BC Managed Care – PPO

## 2021-02-02 ENCOUNTER — Ambulatory Visit (HOSPITAL_COMMUNITY): Payer: BC Managed Care – PPO | Admitting: Physical Therapy

## 2021-02-02 ENCOUNTER — Encounter (HOSPITAL_COMMUNITY): Payer: BC Managed Care – PPO | Admitting: Occupational Therapy

## 2021-02-07 ENCOUNTER — Encounter: Payer: BC Managed Care – PPO | Admitting: Speech Pathology

## 2021-02-07 ENCOUNTER — Ambulatory Visit: Payer: BC Managed Care – PPO

## 2021-02-09 ENCOUNTER — Ambulatory Visit: Payer: BC Managed Care – PPO

## 2021-02-09 ENCOUNTER — Encounter: Payer: BC Managed Care – PPO | Admitting: Occupational Therapy

## 2021-02-09 ENCOUNTER — Encounter: Payer: BC Managed Care – PPO | Admitting: Speech Pathology

## 2021-02-14 ENCOUNTER — Encounter: Payer: Self-pay | Admitting: Diagnostic Neuroimaging

## 2021-02-14 ENCOUNTER — Ambulatory Visit: Payer: BC Managed Care – PPO | Admitting: Diagnostic Neuroimaging

## 2021-02-24 ENCOUNTER — Encounter (HOSPITAL_COMMUNITY): Payer: Self-pay | Admitting: Occupational Therapy

## 2021-02-24 NOTE — Therapy (Signed)
[facility] [address] [place], [place], [address] Phone: [phone]   Fax:  [phone]  Patient Details  Name: Darryl Sullivan MRN: [id number] Date of Birth: 03-14-1966 Referring Provider:  No ref. provider found  Encounter Date: 02/24/2021   OCCUPATIONAL THERAPY DISCHARGE SUMMARY  Visits from Start of Care: 4  Current functional level related to goals / functional outcomes: Unknown. Pt did not return to therapy after last session on 12/27/20. Pt was discharged due to attendance policy.    Remaining deficits: unknown   Education / Equipment: HEP   Patient agrees to discharge. Patient goals were not met. Patient is being discharged due to not returning since the last visit.[name]     Guadelupe Sabin, OTR/L  [phone] 02/24/2021, 5:52 PM  [facility] [address] [place], [place], [address] Phone: [phone]   Fax:  [phone]

## 2021-02-27 ENCOUNTER — Ambulatory Visit (HOSPITAL_COMMUNITY): Payer: BC Managed Care – PPO | Attending: Physician Assistant | Admitting: Physical Therapy

## 2021-02-27 ENCOUNTER — Ambulatory Visit (HOSPITAL_COMMUNITY): Payer: BC Managed Care – PPO

## 2021-03-28 ENCOUNTER — Ambulatory Visit (HOSPITAL_COMMUNITY): Payer: BC Managed Care – PPO

## 2021-03-30 ENCOUNTER — Ambulatory Visit (HOSPITAL_COMMUNITY): Payer: BC Managed Care – PPO | Attending: Physician Assistant

## 2021-03-30 ENCOUNTER — Ambulatory Visit (HOSPITAL_COMMUNITY): Payer: BC Managed Care – PPO | Admitting: Physical Therapy

## 2021-05-31 ENCOUNTER — Encounter (HOSPITAL_COMMUNITY): Payer: Self-pay

## 2021-05-31 NOTE — Therapy (Signed)
[facility] [address] [place], [place], [address] Phone: [phone]   Fax:  [phone]  Patient Details  Name: Darryl Sullivan MRN: [id number] Date of Birth: [birth year] Referring Provider:  No ref. provider found  Encounter Date: 05/31/2021  PHYSICAL THERAPY DISCHARGE SUMMARY  Visits from Start of Care: 1  Current functional level related to goals / functional outcomes: N/A   Remaining deficits: See evaluation   Education / Equipment: N/A   Patient agrees to discharge. Patient goals were not met. Patient is being discharged due to not returning since the last visit.  Toniann Fail, PT 05/31/2021, 4:22 PM  [facility] [address] [place], [place], [address] Phone: [phone]   Fax:  [phone]

## 2022-07-16 ENCOUNTER — Telehealth: Payer: Self-pay | Admitting: Family Medicine

## 2022-07-16 ENCOUNTER — Ambulatory Visit (INDEPENDENT_AMBULATORY_CARE_PROVIDER_SITE_OTHER): Payer: Medicaid Other | Admitting: Family Medicine

## 2022-07-16 ENCOUNTER — Encounter: Payer: Self-pay | Admitting: *Deleted

## 2022-07-16 ENCOUNTER — Encounter: Payer: Self-pay | Admitting: Family Medicine

## 2022-07-16 VITALS — BP 209/113 | HR 105 | Ht 67.0 in | Wt 256.0 lb

## 2022-07-16 DIAGNOSIS — E039 Hypothyroidism, unspecified: Secondary | ICD-10-CM

## 2022-07-16 DIAGNOSIS — Z23 Encounter for immunization: Secondary | ICD-10-CM

## 2022-07-16 DIAGNOSIS — I10 Essential (primary) hypertension: Secondary | ICD-10-CM | POA: Diagnosis not present

## 2022-07-16 DIAGNOSIS — R7301 Impaired fasting glucose: Secondary | ICD-10-CM | POA: Diagnosis not present

## 2022-07-16 DIAGNOSIS — E785 Hyperlipidemia, unspecified: Secondary | ICD-10-CM | POA: Diagnosis not present

## 2022-07-16 DIAGNOSIS — Z1159 Encounter for screening for other viral diseases: Secondary | ICD-10-CM

## 2022-07-16 DIAGNOSIS — Z1211 Encounter for screening for malignant neoplasm of colon: Secondary | ICD-10-CM

## 2022-07-16 MED ORDER — OLMESARTAN MEDOXOMIL-HCTZ 40-12.5 MG PO TABS: 1.0000 | ORAL_TABLET | Freq: Every day | ORAL | 1 refills | Status: DC

## 2022-07-16 MED ORDER — ATORVASTATIN CALCIUM 80 MG PO TABS: 80.0000 mg | ORAL_TABLET | Freq: Every day | ORAL | 3 refills | Status: DC

## 2022-07-16 NOTE — Progress Notes (Signed)
New Patient Office Visit   Subjective   Patient ID: Darryl Sullivan, male    DOB: 06/30/1965  Age: 57 y.o. MRN: [id number]  CC:  Chief Complaint  Patient presents with   Establish Care   Hypertension   Back Pain    HPI Darryl Sullivan presents to establish care. He  has a past medical history of Hypertension and Stroke ([place]).With left side weakness residue.  Patient here for elevated blood pressure. He is not exercising and is adherent to low salt diet.  Blood pressure is not well controlled at home. Cardiac symptoms include fatigue and lower extremity edema. Patient denies chest pain, dyspnea, and palpitations.  Cardiovascular risk factors: advanced age (older than 27 for men, 83 for women), dyslipidemia, hypertension, male gender, obesity (BMI >= 30 kg/m2), sedentary lifestyle, and smoking/ tobacco exposure.  Hypertension This is a chronic problem. The current episode started more than 1 year ago. The problem has been rapidly worsening since onset. The problem is uncontrolled. Associated symptoms include headaches, malaise/fatigue and peripheral edema. Pertinent negatives include no anxiety, blurred vision, chest pain, orthopnea, palpitations or shortness of breath. Risk factors for coronary artery disease include male gender, obesity, sedentary lifestyle, smoking/tobacco exposure, family history and dyslipidemia. Past treatments include diuretics, beta blockers and calcium channel blockers (Patient stated has not taken his blood pressure medications in over a year). Compliance problems include diet, exercise and medication cost.  Hypertensive end-organ damage includes CVA.      Outpatient Encounter Medications as of 07/16/2022  Medication Sig   olmesartan-hydrochlorothiazide (BENICAR HCT) 40-12.5 MG tablet Take 1 tablet by mouth daily.   acetaminophen (TYLENOL) 325 MG tablet Take 1-2 tablets (325-650 mg total) by mouth every 4 (four) hours as needed for mild pain.   amLODipine (NORVASC)  10 MG tablet Take 1 tablet (10 mg total) by mouth daily. (Patient not taking: Reported on 07/16/2022)   aspirin 81 MG EC tablet Take 1 tablet (81 mg total) by mouth daily. Swallow whole. (Patient not taking: Reported on 07/16/2022)   atorvastatin (LIPITOR) 80 MG tablet Take 1 tablet (80 mg total) by mouth daily.   diclofenac Sodium (VOLTAREN) 1 % GEL Apply 2 g topically 4 (four) times daily as needed (left shoulder pain).   Menthol-Methyl Salicylate (MUSCLE RUB) 10-15 % CREA Apply 1 application topically 2 (two) times daily as needed for muscle pain.   metoprolol tartrate (LOPRESSOR) 25 MG tablet Take 1 tablet (25 mg total) by mouth 2 (two) times daily. (Patient not taking: Reported on 07/16/2022)   potassium chloride SA (KLOR-CON) 20 MEQ tablet Take 1 tablet (20 mEq total) by mouth daily. (Patient not taking: Reported on 07/16/2022)   [DISCONTINUED] atorvastatin (LIPITOR) 80 MG tablet Take 1 tablet (80 mg total) by mouth daily. (Patient not taking: Reported on 07/16/2022)   [DISCONTINUED] hydrochlorothiazide (HYDRODIURIL) 25 MG tablet Take 1 tablet (25 mg total) by mouth daily. (Patient not taking: Reported on 07/16/2022)   No facility-administered encounter medications on file as of 07/16/2022.    No past surgical history on file.  Review of Systems  Constitutional:  Positive for malaise/fatigue. Negative for chills and fever.  HENT:  Negative for ear pain.   Eyes:  Negative for blurred vision.  Respiratory:  Negative for shortness of breath.   Cardiovascular:  Positive for leg swelling. Negative for chest pain, palpitations and orthopnea.  Gastrointestinal:  Negative for abdominal pain, nausea and vomiting.  Genitourinary:  Negative for dysuria.  Musculoskeletal:  Positive for back  pain.  Neurological:  Positive for headaches.  Psychiatric/Behavioral:  The patient is nervous/anxious.       Objective    BP (!) 209/113   Pulse (!) 105   Ht 5\' 7"  (1.702 m)   Wt 256 lb (116.1 kg)   SpO2 97%   BMI  40.10 kg/m   Physical Exam Cardiovascular:     Rate and Rhythm: Regular rhythm. Tachycardia present.     Pulses: Normal pulses.     Heart sounds: Normal heart sounds.  Pulmonary:     Effort: Pulmonary effort is normal.     Breath sounds: Normal breath sounds.  Musculoskeletal:        General: Swelling present. No tenderness, deformity or signs of injury.     Right lower leg: Edema present.     Left lower leg: Edema present.  Skin:    General: Skin is warm and dry.     Capillary Refill: Capillary refill takes less than 2 seconds.  Neurological:     General: No focal deficit present.     Mental Status: He is alert and oriented to person, place, and time.  Psychiatric:        Mood and Affect: Mood normal.       Assessment & Plan:  Primary hypertension -     Microalbumin / creatinine urine ratio -     CMP14+EGFR -     Olmesartan Medoxomil-HCTZ; Take 1 tablet by mouth daily.  Dispense: 30 tablet; Refill: 1  IFG (impaired fasting glucose) -     Hemoglobin A1c  Hypothyroidism, unspecified type -     TSH + free T4  Hyperlipidemia, unspecified hyperlipidemia type -     Lipid panel -     CBC with Differential/Platelet -     Atorvastatin Calcium; Take 1 tablet (80 mg total) by mouth daily.  Dispense: 90 tablet; Refill: 3  Need for hepatitis C screening test -     Hepatitis C antibody  Encounter for screening colonoscopy -     Ambulatory referral to Gastroenterology  Essential hypertension Assessment & Plan: Started patient on olmesartan-hydrochlorothazide 40-12.5mg  as initial therapy Follow up in 2 weeks or sooner if needed with blood pressure reading log to evaluate trends and medication management. Explained patient to visit the ED due to his high blood pressure of 209/113, Discussed signs and symptoms of major cardiovascular event and need to present to the ED. Discussed Non Pharmacological interventions such as low salt, cardiac diet,following a DASH diet which  includes vegetables,fruits,whole grains, fat free or low fat diary,fish,poultry,beans,nuts and seeds,vegetable oils.  Find an activity that you will enjoy and start to be active at least 5 days a week for 30 minutes each day.  Patient verbalizes understanding regarding plan of care and all questions answered.    Other orders -     Flu Vaccine QUAD 64mo+IM (Fluarix, Fluzone & Alfiuria Quad PF)    Return in about 2 weeks (around 07/30/2022) for hypertension, re-check blood pressure.   Renard Hamper Ria Comment, FNP

## 2022-07-16 NOTE — Assessment & Plan Note (Addendum)
Started patient on olmesartan-hydrochlorothazide 40-12.5mg  as initial therapy Follow up in 2 weeks or sooner if needed with blood pressure reading log to evaluate trends and medication management. Explained patient to visit the ED due to his high blood pressure of 209/113, Discussed signs and symptoms of major cardiovascular event and need to present to the ED. Discussed Non Pharmacological interventions such as low salt, cardiac diet,following a DASH diet which includes vegetables,fruits,whole grains, fat free or low fat diary,fish,poultry,beans,nuts and seeds,vegetable oils.  Find an activity that you will enjoy and start to be active at least 5 days a week for 30 minutes each day.  Patient verbalizes understanding regarding plan of care and all questions answered.

## 2022-07-16 NOTE — Telephone Encounter (Signed)
PCS forms   Noted  Copied  Sleeved CMA has original  Copy in brown folder

## 2022-07-16 NOTE — Patient Instructions (Signed)
Please Visit the ED for High Blood Pressure Please take medications as prescribed. Follow up in 2 weeks with blood pressure log.

## 2022-07-17 LAB — CBC WITH DIFFERENTIAL/PLATELET
Basophils Absolute: 0 10*3/uL (ref 0.0–0.2)
Basos: 1 %
EOS (ABSOLUTE): 0.1 10*3/uL (ref 0.0–0.4)
Eos: 2 %
Hematocrit: 42.7 % (ref 37.5–51.0)
Hemoglobin: 14.3 g/dL (ref 13.0–17.7)
Immature Grans (Abs): 0 10*3/uL (ref 0.0–0.1)
Immature Granulocytes: 0 %
Lymphocytes Absolute: 1.4 10*3/uL (ref 0.7–3.1)
Lymphs: 27 %
MCH: 28 pg (ref 26.6–33.0)
MCHC: 33.5 g/dL (ref 31.5–35.7)
MCV: 84 fL (ref 79–97)
Monocytes Absolute: 0.2 10*3/uL (ref 0.1–0.9)
Monocytes: 4 %
Neutrophils Absolute: 3.6 10*3/uL (ref 1.4–7.0)
Neutrophils: 66 %
Platelets: 222 10*3/uL (ref 150–450)
RBC: 5.1 x10E6/uL (ref 4.14–5.80)
RDW: 10.6 % — ABNORMAL LOW (ref 11.6–15.4)
WBC: 5.4 10*3/uL (ref 3.4–10.8)

## 2022-07-17 LAB — LIPID PANEL
Chol/HDL Ratio: 5.8 ratio — ABNORMAL HIGH (ref 0.0–5.0)
Cholesterol, Total: 225 mg/dL — ABNORMAL HIGH (ref 100–199)
HDL: 39 mg/dL — ABNORMAL LOW (ref >39)
LDL Chol Calc (NIH): 167 mg/dL — ABNORMAL HIGH (ref 0–99)
Triglycerides: 107 mg/dL (ref 0–149)
VLDL Cholesterol Cal: 19 mg/dL (ref 5–40)

## 2022-07-17 LAB — CMP14+EGFR
ALT: 23 IU/L (ref 0–44)
AST: 17 IU/L (ref 0–40)
Albumin/Globulin Ratio: 1.6 (ref 1.2–2.2)
Albumin: 4.9 g/dL (ref 3.8–4.9)
Alkaline Phosphatase: 96 IU/L (ref 44–121)
BUN/Creatinine Ratio: 12 (ref 9–20)
BUN: 14 mg/dL (ref 6–24)
Bilirubin Total: 0.9 mg/dL (ref 0.0–1.2)
CO2: 21 mmol/L (ref 20–29)
Calcium: 10.1 mg/dL (ref 8.7–10.2)
Chloride: 103 mmol/L (ref 96–106)
Creatinine, Ser: 1.15 mg/dL (ref 0.76–1.27)
Globulin, Total: 3.1 g/dL (ref 1.5–4.5)
Glucose: 113 mg/dL — ABNORMAL HIGH (ref 70–99)
Potassium: 4 mmol/L (ref 3.5–5.2)
Sodium: 141 mmol/L (ref 134–144)
Total Protein: 8 g/dL (ref 6.0–8.5)
eGFR: 75 mL/min/{1.73_m2} (ref >59)

## 2022-07-17 LAB — TSH+FREE T4
Free T4: 1.33 ng/dL (ref 0.82–1.77)
TSH: 1.67 u[IU]/mL (ref 0.450–4.500)

## 2022-07-17 LAB — HEMOGLOBIN A1C
Est. average glucose Bld gHb Est-mCnc: 97 mg/dL
Hgb A1c MFr Bld: 5 % (ref 4.8–5.6)

## 2022-07-17 LAB — HEPATITIS C ANTIBODY: Hep C Virus Ab: NONREACTIVE

## 2022-07-22 NOTE — Progress Notes (Signed)
Please inform patient, to continue taking atorvastatin 80 mg daily, Cholesterol levels elevated, start lifestyle modifications follow diet low in saturated fat, reduce dietary salt intake, avoid fatty foods, maintain an exercise routine 3 to 5 days a week for a minimum total of 150 minutes.  Follow up in 3 months for management

## 2022-07-23 ENCOUNTER — Other Ambulatory Visit: Payer: Self-pay

## 2022-07-30 ENCOUNTER — Encounter: Payer: Self-pay | Admitting: Family Medicine

## 2022-07-30 ENCOUNTER — Ambulatory Visit (INDEPENDENT_AMBULATORY_CARE_PROVIDER_SITE_OTHER): Payer: Medicaid Other | Admitting: Family Medicine

## 2022-07-30 ENCOUNTER — Other Ambulatory Visit: Payer: Self-pay

## 2022-07-30 VITALS — BP 140/98 | HR 109

## 2022-07-30 DIAGNOSIS — G8929 Other chronic pain: Secondary | ICD-10-CM | POA: Diagnosis not present

## 2022-07-30 DIAGNOSIS — I10 Essential (primary) hypertension: Secondary | ICD-10-CM | POA: Diagnosis not present

## 2022-07-30 DIAGNOSIS — E785 Hyperlipidemia, unspecified: Secondary | ICD-10-CM

## 2022-07-30 DIAGNOSIS — M545 Low back pain, unspecified: Secondary | ICD-10-CM | POA: Diagnosis not present

## 2022-07-30 MED ORDER — METOPROLOL TARTRATE 25 MG PO TABS: 12.5000 mg | ORAL_TABLET | Freq: Two times a day (BID) | ORAL | 3 refills | Status: DC

## 2022-07-30 MED ORDER — CYCLOBENZAPRINE HCL 5 MG PO TABS: 5.0000 mg | ORAL_TABLET | Freq: Three times a day (TID) | ORAL | 1 refills | Status: DC | PRN

## 2022-07-30 MED ORDER — METOPROLOL TARTRATE 25 MG PO TABS: 25.0000 mg | ORAL_TABLET | Freq: Two times a day (BID) | ORAL | 3 refills | Status: DC

## 2022-07-30 MED ORDER — OLMESARTAN MEDOXOMIL-HCTZ 40-25 MG PO TABS: 1.0000 | ORAL_TABLET | Freq: Every day | ORAL | 2 refills | Status: DC

## 2022-07-30 NOTE — Progress Notes (Signed)
Patient Office Visit   Subjective   Patient ID: Darryl Sullivan, male    DOB: 07/31/1965  Age: 57 y.o. MRN: [id number]  CC:  Chief Complaint  Patient presents with   Hypertension    Patient is here for BP f/u. States it has been significantly lower since starting the two meds.     HPI Darryl Sullivan 57 year old male,  presents to clinic for hypertension management and back pain. He  has a past medical history of Hypertension and Stroke ([place]).  Patient here for follow-up of elevated blood pressure. He is not exercising and is adherent to low salt diet.  Blood pressure is not well controlled at home. Cardiac symptoms fatigue and lower extremity edema. Patient denies chest pain, claudication, orthopnea, palpitations, syncope, and tachypnea. Cardiovascular risk factors: advanced age, dyslipidemia, hypertension, male gender, obesity (BMI >= 30 kg/m2), and sedentary lifestyle.   Back Pain This is a chronic problem. The current episode started more than 1 year ago. The problem occurs intermittently. The problem has been gradually worsening since onset. The pain is present in the lumbar spine. The quality of the pain is described as burning. The pain does not radiate. The pain is at a severity of 8/10. The pain is moderate. The pain is Worse during the night. The symptoms are aggravated by lying down, standing and twisting. Stiffness is present All day. Pertinent negatives include no bladder incontinence, bowel incontinence, chest pain, fever or headaches. He has tried NSAIDs for the symptoms. The treatment provided no relief.    Outpatient Encounter Medications as of 07/30/2022  Medication Sig   acetaminophen (TYLENOL) 325 MG tablet Take 1-2 tablets (325-650 mg total) by mouth every 4 (four) hours as needed for mild pain.   aspirin 81 MG EC tablet Take 1 tablet (81 mg total) by mouth daily. Swallow whole.   atorvastatin (LIPITOR) 80 MG tablet Take 1 tablet (80 mg total) by mouth daily.    cyclobenzaprine (FLEXERIL) 5 MG tablet Take 1 tablet (5 mg total) by mouth 3 (three) times daily as needed for muscle spasms.   diclofenac Sodium (VOLTAREN) 1 % GEL Apply 2 g topically 4 (four) times daily as needed (left shoulder pain).   Menthol-Methyl Salicylate (MUSCLE RUB) 10-15 % CREA Apply 1 application topically 2 (two) times daily as needed for muscle pain.   olmesartan-hydrochlorothiazide (BENICAR HCT) 40-25 MG tablet Take 1 tablet by mouth daily.   potassium chloride SA (KLOR-CON) 20 MEQ tablet Take 1 tablet (20 mEq total) by mouth daily.   [DISCONTINUED] amLODipine (NORVASC) 10 MG tablet Take 1 tablet (10 mg total) by mouth daily.   [DISCONTINUED] metoprolol tartrate (LOPRESSOR) 25 MG tablet Take 1 tablet (25 mg total) by mouth 2 (two) times daily.   [DISCONTINUED] olmesartan-hydrochlorothiazide (BENICAR HCT) 40-12.5 MG tablet Take 1 tablet by mouth daily.   metoprolol tartrate (LOPRESSOR) 25 MG tablet Take 0.5 tablets (12.5 mg total) by mouth 2 (two) times daily.   [DISCONTINUED] metoprolol tartrate (LOPRESSOR) 25 MG tablet Take 1 tablet (25 mg total) by mouth 2 (two) times daily.   No facility-administered encounter medications on file as of 07/30/2022.    No past surgical history on file.  Review of Systems  Constitutional:  Negative for chills and fever.  Respiratory:  Negative for shortness of breath.   Cardiovascular:  Positive for leg swelling. Negative for chest pain and palpitations.  Gastrointestinal:  Negative for bowel incontinence.  Genitourinary:  Negative for bladder incontinence.  Musculoskeletal:  Positive for back pain.  Neurological:  Negative for dizziness and headaches.  Psychiatric/Behavioral:  The patient is not nervous/anxious.       Objective    BP (!) 140/98   Pulse (!) 109   SpO2 93%   Physical Exam Constitutional:      General: He is not in acute distress. Cardiovascular:     Rate and Rhythm: Normal rate.     Pulses: Normal pulses.   Pulmonary:     Effort: Pulmonary effort is normal. No respiratory distress.     Breath sounds: Normal breath sounds.  Abdominal:     General: Bowel sounds are normal.     Palpations: Abdomen is soft. There is no mass.     Tenderness: There is no abdominal tenderness. There is no right CVA tenderness or left CVA tenderness.  Musculoskeletal:        General: Normal range of motion.     Cervical back: Normal range of motion and neck supple. No rigidity.     Right lower leg: Edema present.     Left lower leg: Edema present.  Skin:    General: Skin is dry.     Capillary Refill: Capillary refill takes less than 2 seconds.  Neurological:     General: No focal deficit present.     Mental Status: He is alert.     Coordination: Coordination abnormal.     Gait: Gait abnormal.  Psychiatric:        Mood and Affect: Mood normal.       Assessment & Plan:  Primary hypertension -     Olmesartan Medoxomil-HCTZ; Take 1 tablet by mouth daily.  Dispense: 90 tablet; Refill: 2 -     Basic metabolic panel -     Metoprolol Tartrate; Take 0.5 tablets (12.5 mg total) by mouth 2 (two) times daily.  Dispense: 60 tablet; Refill: 3  Chronic bilateral low back pain without sciatica Assessment & Plan: Bilateral straight and cross leg raise did not cause pain no discomfort. Prescriptions for back pain flexeril 5 mg as needed sent to pharmacy. Discussed medication desired effects, potential side effects. Non pharmacological interventions include rest, avoid twisting, straining lower back. Demonstration of proper body mechanics. Alternate ice and heat. Recommend stretching back and legs. Follow up for worsening or persistent symptoms. Patient verbalizes understanding regarding plan of care and all questions answered.   Orders: -     Cyclobenzaprine HCl; Take 1 tablet (5 mg total) by mouth 3 (three) times daily as needed for muscle spasms.  Dispense: 30 tablet; Refill: 1  Essential hypertension Assessment &  Plan: Blood pressure not controlled in clinic B/P 166/90, 140/98, Labs ordered Home blood pressure reading log: 144/77, 169/90, 152/73, 152/79, 137/75, 149/83 Increased Olmesartan-HCTZ 40-25 mg daily Added Metoprolol 12.5 mg twice daily Follow up next visit with blood pressure log to evaluate trends and medication management  Explained to follow DASH Diet and  maintain an exercise routine 3 to 5 days a week for a minimum total of 150 minutes.      Return in about 3 months (around 10/28/2022) for hypertension, re-check blood pressure, Hyperlipidemia, Bring blood pressure logs.   Renard Hamper Ria Comment, FNP

## 2022-07-30 NOTE — Assessment & Plan Note (Addendum)
Blood pressure not controlled in clinic B/P 166/90, 140/98, Labs ordered Home blood pressure reading log: 144/77, 169/90, 152/73, 152/79, 137/75, 149/83 Increased Olmesartan-HCTZ 40-25 mg daily Added Metoprolol 12.5 mg twice daily Follow up next visit with blood pressure log to evaluate trends and medication management  Explained to follow DASH Diet and  maintain an exercise routine 3 to 5 days a week for a minimum total of 150 minutes.

## 2022-07-30 NOTE — Patient Instructions (Signed)
It was pleasure meeting with you today. Please take medications as prescribed. Follow up with your primary health provider if any health concerns arises. If symptoms worsen please contact your primary care provider and/or visit the emergency department.

## 2022-07-30 NOTE — Assessment & Plan Note (Addendum)
Bilateral straight and cross leg raise did not cause pain no discomfort. Prescriptions for back pain flexeril 5 mg as needed sent to pharmacy. Discussed medication desired effects, potential side effects. Non pharmacological interventions include rest, avoid twisting, straining lower back. Demonstration of proper body mechanics. Alternate ice and heat. Recommend stretching back and legs. Follow up for worsening or persistent symptoms. Patient verbalizes understanding regarding plan of care and all questions answered.

## 2022-07-31 LAB — BASIC METABOLIC PANEL
BUN/Creatinine Ratio: 13 (ref 9–20)
BUN: 17 mg/dL (ref 6–24)
CO2: 23 mmol/L (ref 20–29)
Calcium: 10 mg/dL (ref 8.7–10.2)
Chloride: 102 mmol/L (ref 96–106)
Creatinine, Ser: 1.34 mg/dL — ABNORMAL HIGH (ref 0.76–1.27)
Glucose: 122 mg/dL — ABNORMAL HIGH (ref 70–99)
Potassium: 4.4 mmol/L (ref 3.5–5.2)
Sodium: 141 mmol/L (ref 134–144)
eGFR: 62 mL/min/{1.73_m2} (ref >59)

## 2022-08-01 LAB — MICROALBUMIN / CREATININE URINE RATIO
Creatinine, Urine: 263.3 mg/dL
Microalb/Creat Ratio: 3 mg/g creat (ref 0–29)
Microalbumin, Urine: 8.2 ug/mL

## 2022-09-04 ENCOUNTER — Telehealth: Payer: Self-pay | Admitting: Family Medicine

## 2022-09-04 NOTE — Telephone Encounter (Signed)
[employer] forms [employer] Copied Noted sleeved

## 2022-09-28 ENCOUNTER — Telehealth: Payer: Self-pay | Admitting: Family Medicine

## 2022-09-28 NOTE — Telephone Encounter (Signed)
LTD forms once completed, need to send to medical records so medical record department to send all records from 03.28.2022 to present.    Copied Noted sleeved

## 2022-09-30 ENCOUNTER — Other Ambulatory Visit: Payer: Self-pay | Admitting: Family Medicine

## 2022-10-04 NOTE — Telephone Encounter (Signed)
Pt aware forms ready for pick up  Faxed to [facility]

## 2022-10-29 ENCOUNTER — Ambulatory Visit: Payer: Medicaid Other | Admitting: Family Medicine

## 2022-12-04 ENCOUNTER — Ambulatory Visit: Payer: Medicaid Other | Admitting: Family Medicine

## 2022-12-10 ENCOUNTER — Ambulatory Visit: Payer: Medicaid Other | Admitting: Family Medicine

## 2022-12-15 ENCOUNTER — Other Ambulatory Visit: Payer: Self-pay | Admitting: Family Medicine

## 2022-12-18 ENCOUNTER — Ambulatory Visit: Payer: Medicaid Other | Admitting: Family Medicine

## 2023-01-14 ENCOUNTER — Encounter: Payer: Self-pay | Admitting: *Deleted

## 2023-03-08 IMAGING — CT CT HEAD W/O CM
3 series · 15 of 45 positions shown, 18 images · non-contrast
Comparison: 09/06/2020

CLINICAL DATA: Stroke post tPA with small hemorrhage, follow-up

EXAM:
CT HEAD WITHOUT CONTRAST
TECHNIQUE: Contiguous axial images were obtained from the base of the skull
through the vertex without intravenous contrast.

[Series 3: head wo · axial · 0.45mm/px · z∈[-125,-10]mm · 9 of 28 slices shown, 12 images]
[im 3/28  brain]
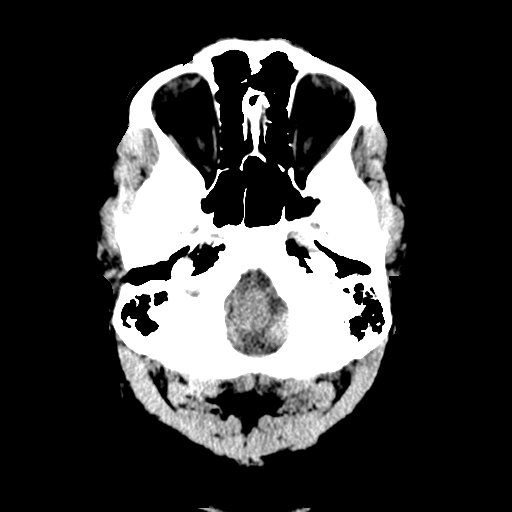
[im 3/28  bone]
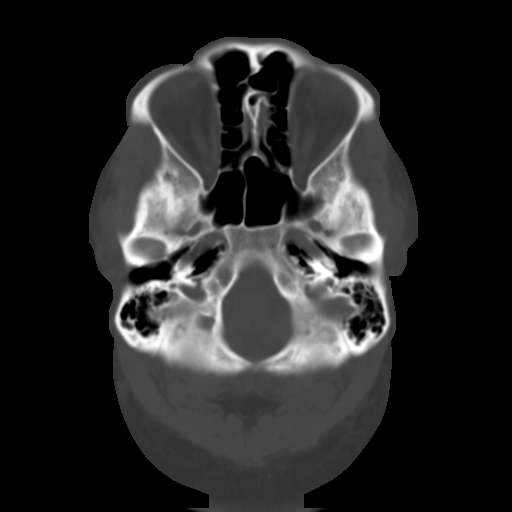
[im 6/28  brain]
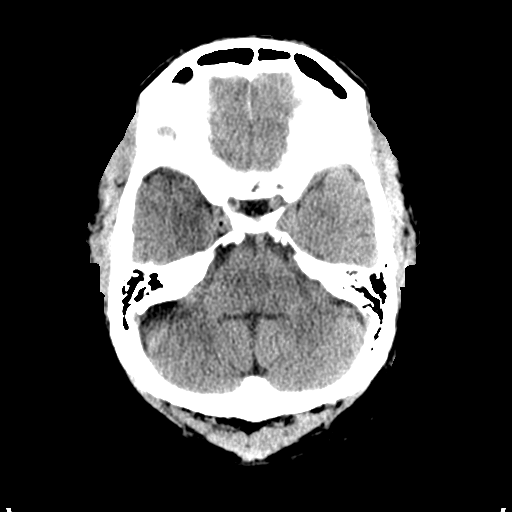
[im 9/28  brain]
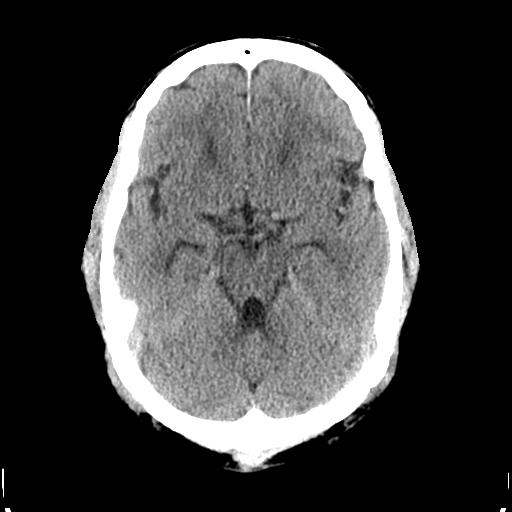
[im 12/28  brain]
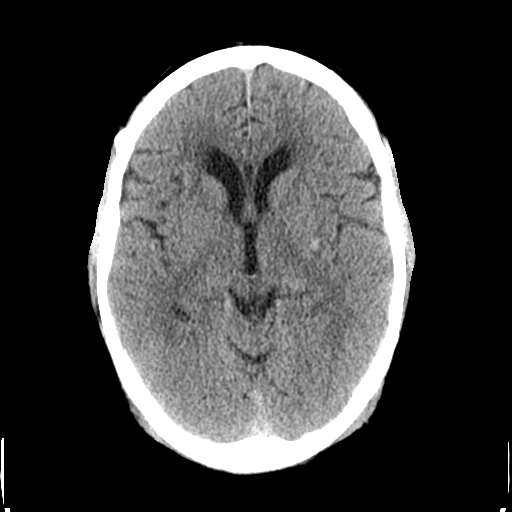
[im 15/28  brain]
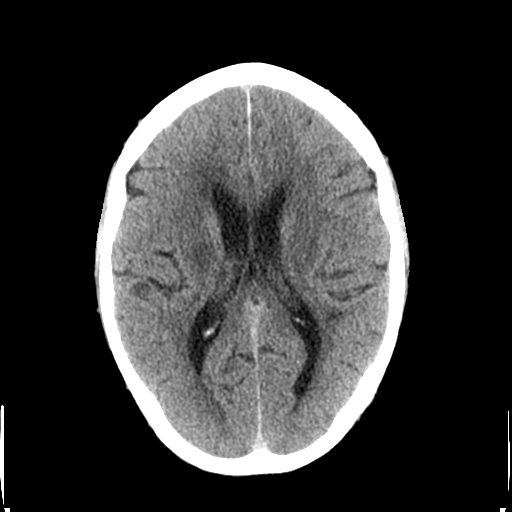
[im 15/28  bone]
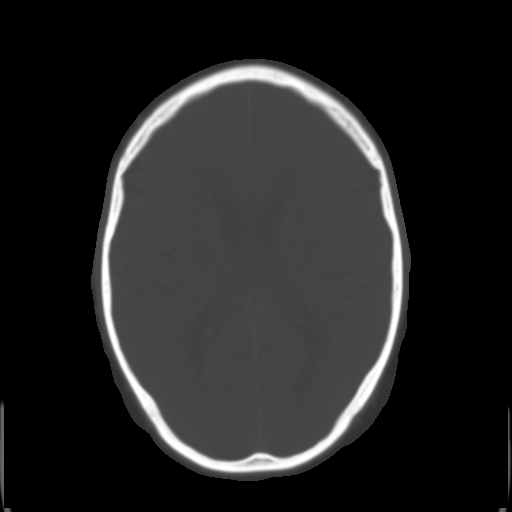
[im 17/28  brain]
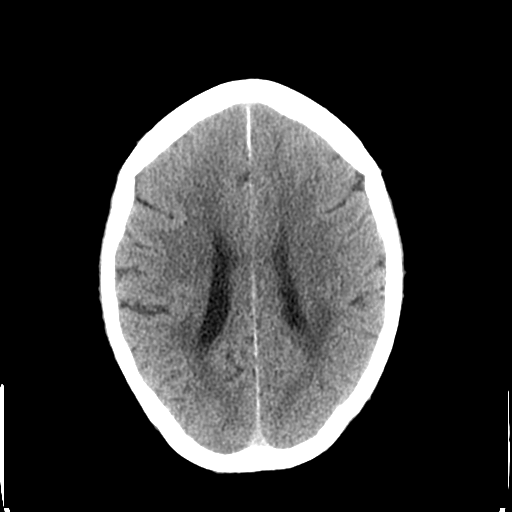
[im 20/28  brain]
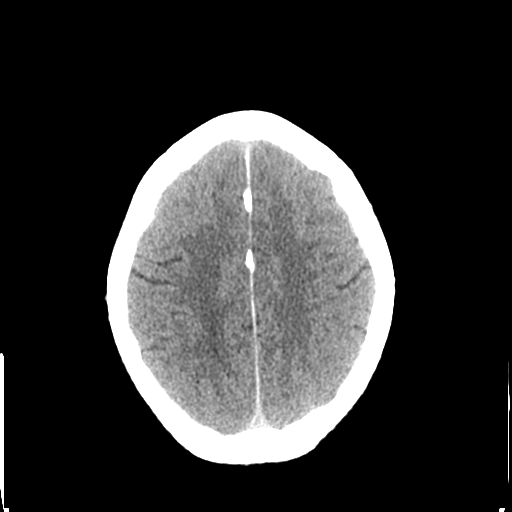
[im 23/28  brain]
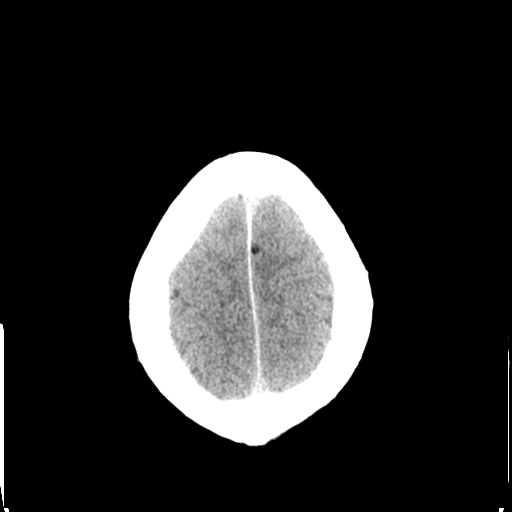
[im 26/28  brain]
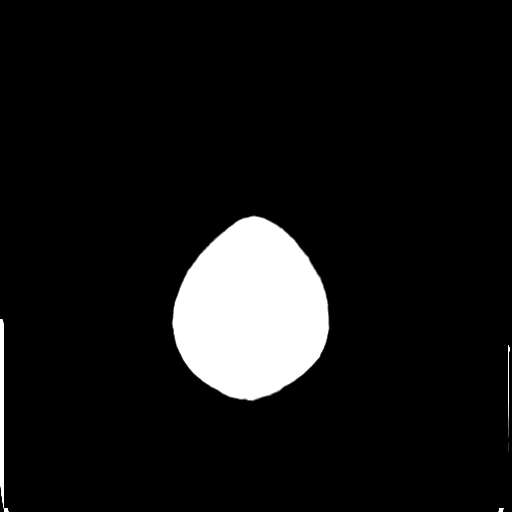
[im 26/28  bone]
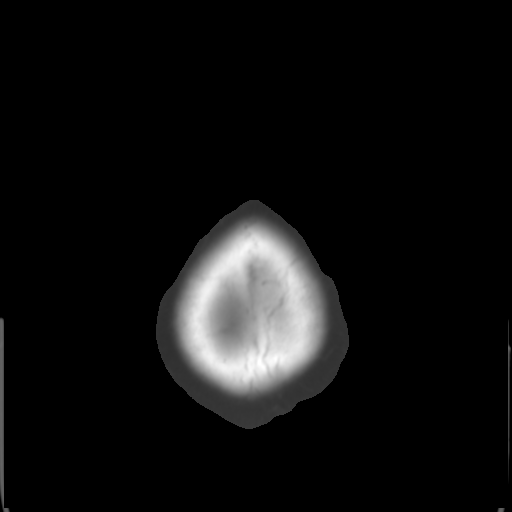

[Series 4: coronal soft tissue · coronal · 0.32mm/px · 3 of 70 slices shown]
[im 24/70  brain]
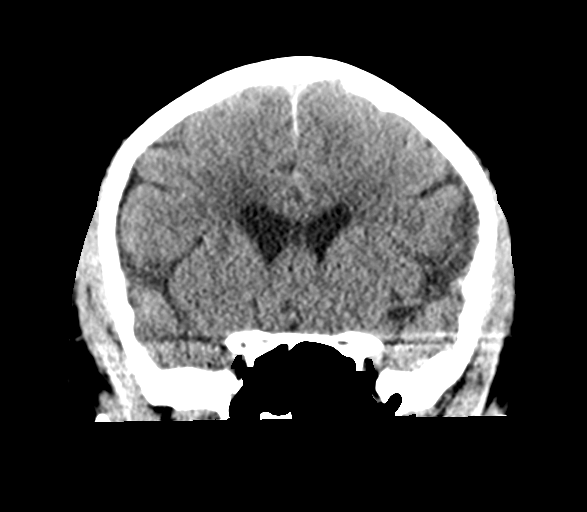
[im 31/70  brain]
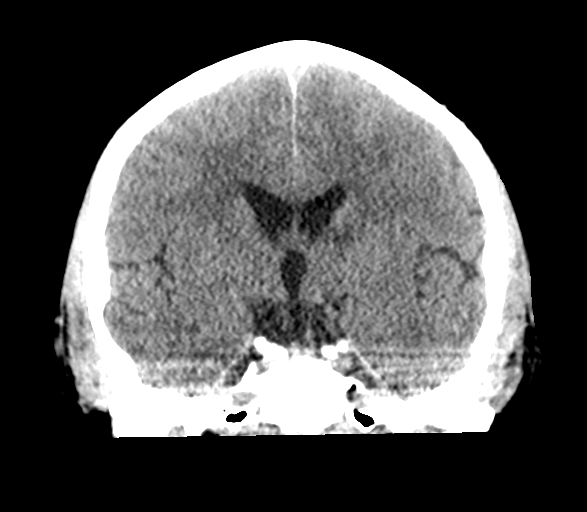
[im 39/70  brain]
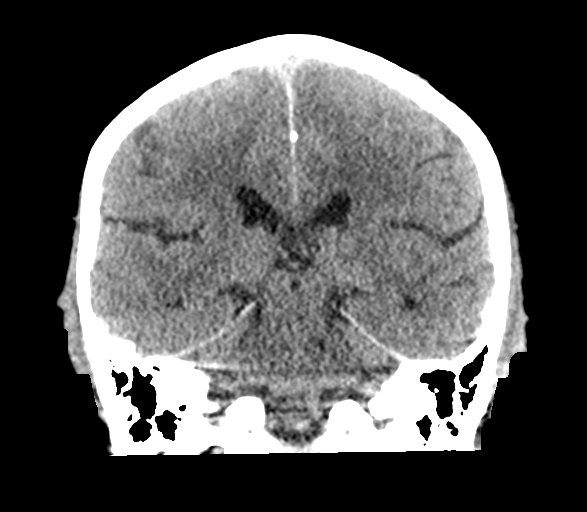

[Series 5: sagittal soft tissue · sagittal · 0.32mm/px · 3 of 58 slices shown]
[im 20/58  brain]
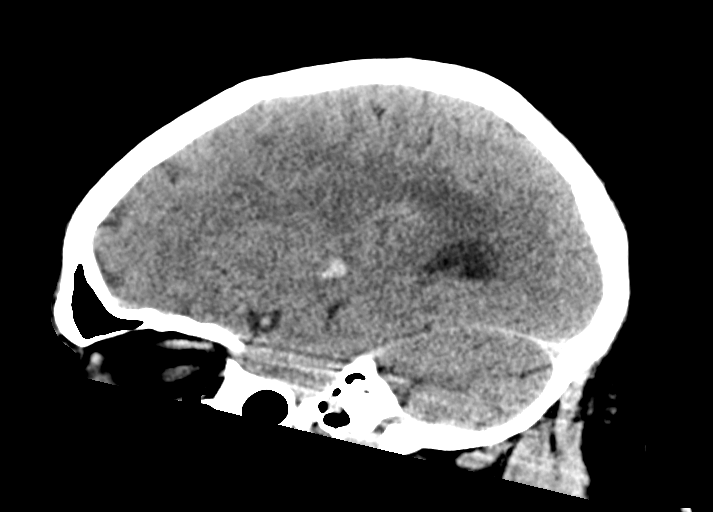
[im 29/58  brain]
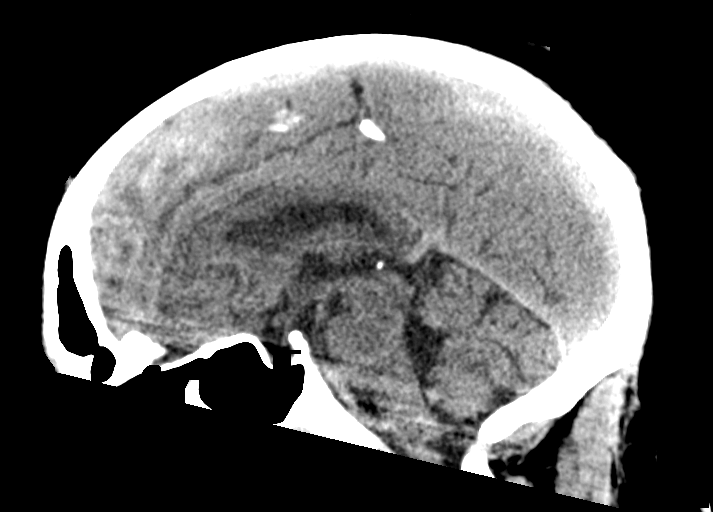
[im 39/58  brain]
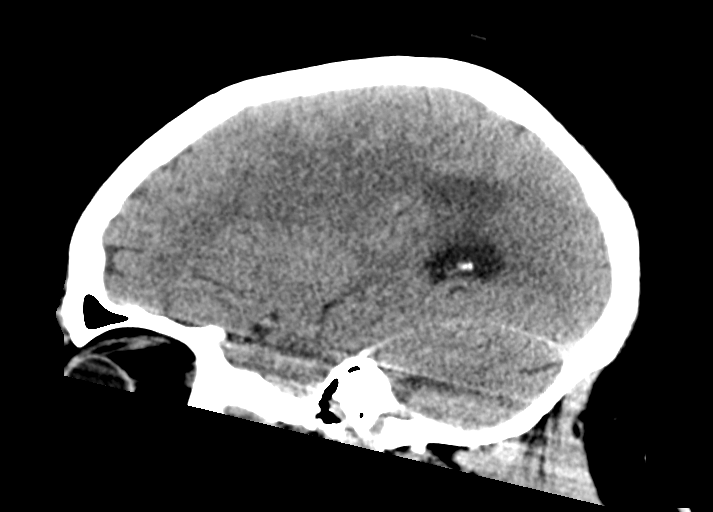

[15 of 45 positions shown; findings below may reference images not displayed]

FINDINGS: Brain: Small focus of hyperdensity is again identified at the
posterior aspect of the left putamen, which was not present on
09/05/2020. There is hypoattenuation within the right basal ganglia
and adjacent white matter corresponding to acute infarction on MRI.
Additional punctate left temporal infarct on MRI is not seen due to
size. Stable findings of chronic microvascular ischemic changes.
Small chronic infarct of the right centrum semiovale. Ventricles are
stable in size.

Vascular: No new findings.

Skull: Calvarium is unremarkable.

Sinuses/Orbits: No acute finding.

Other: None.
IMPRESSION: Stable punctate left basal ganglia hemorrhage.

Evolving recent infarction of right basal ganglia and adjacent white
matter.

## 2023-10-17 ENCOUNTER — Ambulatory Visit: Payer: Self-pay | Admitting: Family Medicine

## 2023-10-21 ENCOUNTER — Ambulatory Visit (INDEPENDENT_AMBULATORY_CARE_PROVIDER_SITE_OTHER): Payer: Self-pay

## 2023-10-21 VITALS — BP 208/121 | HR 112 | Ht 67.0 in | Wt 240.0 lb

## 2023-10-21 DIAGNOSIS — M25512 Pain in left shoulder: Secondary | ICD-10-CM

## 2023-10-21 DIAGNOSIS — I10 Essential (primary) hypertension: Secondary | ICD-10-CM

## 2023-10-21 DIAGNOSIS — R7301 Impaired fasting glucose: Secondary | ICD-10-CM | POA: Insufficient documentation

## 2023-10-21 DIAGNOSIS — G8929 Other chronic pain: Secondary | ICD-10-CM

## 2023-10-21 DIAGNOSIS — E785 Hyperlipidemia, unspecified: Secondary | ICD-10-CM

## 2023-10-21 MED ORDER — METHOCARBAMOL 750 MG PO TABS: 750.0000 mg | ORAL_TABLET | Freq: Three times a day (TID) | ORAL | 5 refills | Status: AC | PRN

## 2023-10-21 MED ORDER — ATORVASTATIN CALCIUM 80 MG PO TABS: 80.0000 mg | ORAL_TABLET | Freq: Every day | ORAL | 1 refills | Status: AC

## 2023-10-21 MED ORDER — OLMESARTAN MEDOXOMIL-HCTZ 40-25 MG PO TABS: 1.0000 | ORAL_TABLET | Freq: Every day | ORAL | 1 refills | Status: AC

## 2023-10-21 MED ORDER — METOPROLOL TARTRATE 25 MG PO TABS: 12.5000 mg | ORAL_TABLET | Freq: Two times a day (BID) | ORAL | 1 refills | Status: AC

## 2023-10-21 NOTE — Progress Notes (Signed)
 Established Patient Office Visit  Subjective   Patient ID: Darryl Sullivan, male    DOB: 01-Aug-1965  Age: 58 y.o. MRN: [id number]  Chief Complaint  Patient presents with   Medical Management of Chronic Issues    Pt states "Left shoulder and back hurts, tylenol  prescribed aint helping" "Need something stronger"    HPI  Patient Active Problem List   Diagnosis Date Noted   IFG (impaired fasting glucose) 10/21/2023   Chronic back pain 07/30/2022   Impingement syndrome of left shoulder 10/10/2020   Stroke of right basal ganglia (HCC) 09/14/2020   Hyperlipidemia    Stage 3 chronic kidney disease (HCC)    Essential hypertension    Chronic left shoulder pain    Tobacco abuse    Hypertensive emergency    Cerebrovascular accident (CVA) (HCC) 09/05/2020   Past Medical History:  Diagnosis Date   Hypertension    Stroke [facility])       Review of Systems  Constitutional: Negative.   HENT: Negative.    Eyes: Negative.   Respiratory: Negative.    Cardiovascular: Negative.   Gastrointestinal: Negative.   Genitourinary: Negative.   Musculoskeletal:  Positive for back pain (lower left) and joint pain (left shoulder).  Skin: Negative.   Neurological: Negative.   Psychiatric/Behavioral: Negative.        Objective:     BP (!) 208/121   Pulse (!) 112   Ht 5\' 7"  (1.702 m)   Wt 240 lb 0.6 oz (108.9 kg)   SpO2 94%   BMI 37.60 kg/m  BP Readings from Last 3 Encounters:  10/21/23 (!) 208/121  07/30/22 (!) 140/98  07/16/22 (!) 209/113   Wt Readings from Last 3 Encounters:  10/21/23 240 lb 0.6 oz (108.9 kg)  07/16/22 256 lb (116.1 kg)  10/27/20 220 lb (99.8 kg)      Physical Exam Vitals and nursing note reviewed. Chaperone present: wife and son are with pt today.  Constitutional:      Appearance: Normal appearance. He is obese.  HENT:     Head: Normocephalic.     Right Ear: Tympanic membrane, ear canal and external ear normal.     Left Ear: Tympanic membrane, ear canal and  external ear normal.     Nose: Nose normal.     Mouth/Throat:     Mouth: Mucous membranes are moist.     Pharynx: Oropharynx is clear.  Eyes:     Extraocular Movements: Extraocular movements intact.     Pupils: Pupils are equal, round, and reactive to light.  Cardiovascular:     Rate and Rhythm: Normal rate and regular rhythm.  Pulmonary:     Effort: Pulmonary effort is normal.     Breath sounds: Normal breath sounds.  Musculoskeletal:     Cervical back: Normal range of motion and neck supple.     Lumbar back: Spasms and tenderness (left lubar) present.  Skin:    General: Skin is warm and dry.  Neurological:     Mental Status: He is alert and oriented to person, place, and time.     Gait: Gait abnormal (ambulates with WC).  Psychiatric:        Mood and Affect: Mood normal.        Thought Content: Thought content normal.      No results found for any visits on 10/21/23.    The ASCVD Risk score (Arnett DK, et al., 2019) failed to calculate for the following reasons:   Risk  score cannot be calculated because patient has a medical history suggesting prior/existing ASCVD    Assessment & Plan:   Problem List Items Addressed This Visit       Cardiovascular and Mediastinum   Essential hypertension - Primary   Blood pressure is poorly controlled.  Patient states that he has not had his blood pressure medicine in over 3 months.  Discussed with patient and his family for the need of strict blood pressure control and consistent administration of medications.  Continue with current dose of olmesartan -HCTZ 40-25 mg daily and metoprolol  12.5 mg twice daily.  Recommend DASH diet and monitoring blood pressure at home. Recommend follow-up in 4 to 6 weeks to recheck blood pressure.      Relevant Medications   atorvastatin  (LIPITOR ) 80 MG tablet   metoprolol  tartrate (LOPRESSOR ) 25 MG tablet   olmesartan -hydrochlorothiazide  (BENICAR  HCT) 40-25 MG tablet     Endocrine   IFG (impaired  fasting glucose)   Check fasting labs today.  Follow-up according to lab results.      Relevant Orders   CMP14+EGFR   HgB A1c     Other   Hyperlipidemia   Check fasting labs today.  Follow-up according to lab results.      Relevant Medications   atorvastatin  (LIPITOR ) 80 MG tablet   metoprolol  tartrate (LOPRESSOR ) 25 MG tablet   olmesartan -hydrochlorothiazide  (BENICAR  HCT) 40-25 MG tablet   Chronic left shoulder pain   Ongoing since stroke in 2021.  He has very limited range of motion in this shoulder.  Will refer to Ortho for further evaluation and treatment. Robaxin added for muscle and back pain.      Relevant Medications   methocarbamol (ROBAXIN-750) 750 MG tablet   Other Relevant Orders   Ambulatory referral to Orthopedic Surgery   Other Visit Diagnoses       Primary hypertension       Relevant Medications   atorvastatin  (LIPITOR ) 80 MG tablet   metoprolol  tartrate (LOPRESSOR ) 25 MG tablet   olmesartan -hydrochlorothiazide  (BENICAR  HCT) 40-25 MG tablet       Return in about 6 weeks (around 12/02/2023).    Alison Irvine, FNP

## 2023-10-21 NOTE — Assessment & Plan Note (Signed)
 Blood pressure is poorly controlled.  Patient states that he has not had his blood pressure medicine in over 3 months.  Discussed with patient and his family for the need of strict blood pressure control and consistent administration of medications.  Continue with current dose of olmesartan -HCTZ 40-25 mg daily and metoprolol  12.5 mg twice daily.  Recommend DASH diet and monitoring blood pressure at home. Recommend follow-up in 4 to 6 weeks to recheck blood pressure.

## 2023-10-21 NOTE — Assessment & Plan Note (Signed)
 Ongoing since stroke in 2021.  He has very limited range of motion in this shoulder.  Will refer to Ortho for further evaluation and treatment. Robaxin added for muscle and back pain.

## 2023-10-21 NOTE — Assessment & Plan Note (Signed)
 Check fasting labs today.  Follow-up according to lab results.

## 2023-10-22 LAB — HEMOGLOBIN A1C
Est. average glucose Bld gHb Est-mCnc: 97 mg/dL
Hgb A1c MFr Bld: 5 % (ref 4.8–5.6)

## 2023-10-22 LAB — CMP14+EGFR
ALT: 23 IU/L (ref 0–44)
AST: 19 IU/L (ref 0–40)
Albumin: 4.9 g/dL (ref 3.8–4.9)
Alkaline Phosphatase: 89 IU/L (ref 44–121)
BUN/Creatinine Ratio: 9 (ref 9–20)
BUN: 13 mg/dL (ref 6–24)
Bilirubin Total: 1.2 mg/dL (ref 0.0–1.2)
CO2: 24 mmol/L (ref 20–29)
Calcium: 10.1 mg/dL (ref 8.7–10.2)
Chloride: 104 mmol/L (ref 96–106)
Creatinine, Ser: 1.46 mg/dL — ABNORMAL HIGH (ref 0.76–1.27)
Globulin, Total: 2.9 g/dL (ref 1.5–4.5)
Glucose: 86 mg/dL (ref 70–99)
Potassium: 4.8 mmol/L (ref 3.5–5.2)
Sodium: 143 mmol/L (ref 134–144)
Total Protein: 7.8 g/dL (ref 6.0–8.5)
eGFR: 55 mL/min/{1.73_m2} — ABNORMAL LOW (ref >59)

## 2023-10-22 LAB — LIPID PANEL
Chol/HDL Ratio: 4.6 ratio (ref 0.0–5.0)
Cholesterol, Total: 182 mg/dL (ref 100–199)
HDL: 40 mg/dL (ref >39)
LDL Chol Calc (NIH): 132 mg/dL — ABNORMAL HIGH (ref 0–99)
Triglycerides: 53 mg/dL (ref 0–149)
VLDL Cholesterol Cal: 10 mg/dL (ref 5–40)

## 2023-11-07 ENCOUNTER — Telehealth: Payer: Self-pay | Admitting: Family Medicine

## 2023-11-07 NOTE — Telephone Encounter (Signed)
 MEDICAL REQUEST / [facility]   Noted  Copied Sleeved  Original in PCP box Copy front desk folder

## 2023-11-07 NOTE — Telephone Encounter (Signed)
 In providers box to complete.

## 2023-11-08 ENCOUNTER — Other Ambulatory Visit: Payer: Self-pay | Admitting: Family Medicine

## 2023-11-08 DIAGNOSIS — I10 Essential (primary) hypertension: Secondary | ICD-10-CM

## 2023-11-10 ENCOUNTER — Ambulatory Visit: Payer: Self-pay

## 2023-11-11 ENCOUNTER — Telehealth: Payer: Self-pay | Admitting: Family Medicine

## 2023-11-11 NOTE — Telephone Encounter (Signed)
 Pt was called back and was informed his lab results, Pt advised with verbal understanding

## 2023-11-11 NOTE — Progress Notes (Signed)
 Left Vm no answer

## 2023-11-11 NOTE — Telephone Encounter (Unsigned)
 Copied from CRM [id number]. Topic: Clinical - Lab/Test Results >> Nov 11, 2023  9:23 AM Zipporah Him wrote: Reason for CRM: Patient called in regard to missed call from office, was able to relay lab note. Patient verbalized understanding, no further questions.

## 2023-11-12 NOTE — Telephone Encounter (Signed)
 Completed and faxed with confirmation on 6/2 Sent to scan

## 2023-12-02 ENCOUNTER — Ambulatory Visit

## 2023-12-04 ENCOUNTER — Ambulatory Visit: Admitting: Orthopedic Surgery

## 2023-12-04 ENCOUNTER — Telehealth: Payer: Self-pay | Admitting: Family Medicine

## 2023-12-04 NOTE — Telephone Encounter (Unsigned)
 Copied from CRM [id number]. Topic: General - Other >> Dec 04, 2023  1:18 PM Darryl Sullivan wrote: Reason for CRM: Patient called stating that he needs a FL2 form completed. Please call the patient at [phone].

## 2023-12-04 NOTE — Telephone Encounter (Signed)
FL2  Noted  Copied Sleeved  Original in PCP box Copy front desk folder

## 2023-12-05 DIAGNOSIS — Z0279 Encounter for issue of other medical certificate: Secondary | ICD-10-CM

## 2023-12-05 NOTE — Telephone Encounter (Signed)
 Faxed with confirmation Copy for scans Original at FO for patient pick up

## 2023-12-05 NOTE — Telephone Encounter (Signed)
 Provider signed, given to yosseline box for faxing

## 2023-12-05 NOTE — Telephone Encounter (Signed)
 Form filled out, placed in providers box for signature

## 2023-12-24 ENCOUNTER — Ambulatory Visit: Admitting: Orthopedic Surgery

## 2024-01-07 ENCOUNTER — Ambulatory Visit: Admitting: Orthopedic Surgery

## 2024-01-17 ENCOUNTER — Ambulatory Visit: Admitting: Orthopedic Surgery

## 2024-02-06 ENCOUNTER — Ambulatory Visit: Admitting: Family Medicine

## 2024-02-14 ENCOUNTER — Ambulatory Visit: Admitting: Nurse Practitioner

## 2024-02-14 ENCOUNTER — Encounter: Payer: Self-pay | Admitting: Nurse Practitioner

## 2024-02-14 VITALS — BP 123/73 | HR 74 | Ht 68.0 in | Wt 240.0 lb

## 2024-02-14 DIAGNOSIS — R269 Unspecified abnormalities of gait and mobility: Secondary | ICD-10-CM | POA: Diagnosis not present

## 2024-02-14 DIAGNOSIS — M25512 Pain in left shoulder: Secondary | ICD-10-CM

## 2024-02-14 DIAGNOSIS — I69154 Hemiplegia and hemiparesis following nontraumatic intracerebral hemorrhage affecting left non-dominant side: Secondary | ICD-10-CM

## 2024-02-14 DIAGNOSIS — R5383 Other fatigue: Secondary | ICD-10-CM

## 2024-02-14 DIAGNOSIS — I6930 Unspecified sequelae of cerebral infarction: Secondary | ICD-10-CM

## 2024-02-14 DIAGNOSIS — R3981 Functional urinary incontinence: Secondary | ICD-10-CM | POA: Diagnosis not present

## 2024-02-14 DIAGNOSIS — G8929 Other chronic pain: Secondary | ICD-10-CM

## 2024-02-14 DIAGNOSIS — M545 Low back pain, unspecified: Secondary | ICD-10-CM

## 2024-02-14 DIAGNOSIS — I63312 Cerebral infarction due to thrombosis of left middle cerebral artery: Secondary | ICD-10-CM

## 2024-02-14 NOTE — Patient Instructions (Signed)
 1) DME supplies as indicated in HPI 2) Left shoulder and lower back pain - xrays, IBU and acetaminophen  not effective 3) HTN - BP well controlled 4) Follow up appt in 6 months

## 2024-02-14 NOTE — Progress Notes (Signed)
 Established Patient Office Visit  Subjective:  Patient ID: Darryl Sullivan, male    DOB: [birth year]  Age: 58 y.o. MRN: [id number]  Chief Complaint  Patient presents with   Hypertension    Six week follow up    DME    Patient requesting shower chair and rollator walker    Pain    Patient is now not being able to take OTC medications, will throw up    Patient here today for BP recheck, WNL.  Patient needs DME for shower chair with slider, bariatric rollator walker and 2X pull ups three per day.  Patient also is concerned about his Left shoulder pain and lower bilateral centrally located back pain.  Patient has not had any success with pain management taking both tylenol  and IBU.  Patient has not had any xrays so next recommendation to see what is causing the pain.  Hypertension    No other concerns at this time.   Past Medical History:  Diagnosis Date   Hypertension    Stroke [facility])     No past surgical history on file.  Social History   Socioeconomic History   Marital status: Divorced    Spouse name: Not on file   Number of children: Not on file   Years of education: Not on file   Highest education level: Not on file  Occupational History   Not on file  Tobacco Use   Smoking status: Former    Current packs/day: 0.50    Average packs/day: 0.5 packs/day for 0.5 years (0.3 ttl pk-yrs)    Types: Cigarettes   Smokeless tobacco: Former  Building services engineer status: Never Used  Substance and Sexual Activity   Alcohol use: No   Drug use: No   Sexual activity: Yes    Birth control/protection: Condom  Other Topics Concern   Not on file  Social History Narrative   Not on file   Social Drivers of Health   Financial Resource Strain: Not on file  Food Insecurity: Not on file  Transportation Needs: Not on file  Physical Activity: Not on file  Stress: Not on file  Social Connections: Not on file  Intimate Partner Violence: Not on file    Family History  Problem  Relation Age of Onset   Diabetes Mother    Lupus Mother    Diabetes Mellitus II Mother    Hypertension Father    Diabetes Mellitus II Father     Allergies  Allergen Reactions   Penicillins Hives   Tramadol  Hives    Outpatient Medications Prior to Visit  Medication Sig   aspirin  81 MG EC tablet Take 1 tablet (81 mg total) by mouth daily. Swallow whole.   atorvastatin  (LIPITOR ) 80 MG tablet Take 1 tablet (80 mg total) by mouth daily.   diclofenac  Sodium (VOLTAREN ) 1 % GEL Apply 2 g topically 4 (four) times daily as needed (left shoulder pain).   Menthol -Methyl Salicylate  (MUSCLE RUB) 10-15 % CREA Apply 1 application topically 2 (two) times daily as needed for muscle pain.   methocarbamol  (ROBAXIN -750) 750 MG tablet Take 1 tablet (750 mg total) by mouth every 8 (eight) hours as needed (muscle pain).   metoprolol  tartrate (LOPRESSOR ) 25 MG tablet Take 0.5 tablets (12.5 mg total) by mouth 2 (two) times daily.   olmesartan -hydrochlorothiazide  (BENICAR  HCT) 40-25 MG tablet Take 1 tablet by mouth daily.   potassium chloride  SA (KLOR-CON ) 20 MEQ tablet Take 1 tablet (20 mEq total) by  mouth daily.   No facility-administered medications prior to visit.    ROS     Objective:   BP 123/73   Pulse 74   Ht 5' 8 (1.727 m)   Wt 240 lb (108.9 kg)   SpO2 94%   BMI 36.49 kg/m   Vitals:   02/14/24 1353  BP: 123/73  Pulse: 74  Height: 5' 8 (1.727 m)  Weight: 240 lb (108.9 kg)  SpO2: 94%  BMI (Calculated): 36.5    Physical Exam Vitals and nursing note reviewed.  Constitutional:      Appearance: Normal appearance.  HENT:     Head: Normocephalic.     Nose: Nose normal.     Mouth/Throat:     Mouth: Mucous membranes are moist.  Cardiovascular:     Rate and Rhythm: Normal rate and regular rhythm.     Pulses: Normal pulses.     Heart sounds: Normal heart sounds.  Pulmonary:     Effort: Pulmonary effort is normal.     Breath sounds: Normal breath sounds.  Musculoskeletal:         General: Swelling and tenderness present.     Cervical back: Normal range of motion and neck supple.  Skin:    General: Skin is warm and dry.  Neurological:     Mental Status: He is alert and oriented to person, place, and time.  Psychiatric:        Mood and Affect: Mood normal.        Behavior: Behavior normal.      No results found for any visits on 02/14/24.  No results found for this or any previous visit (from the past 2160 hours).    Assessment & Plan:  1) DME supplies as indicated in HPI 2) Left shoulder and lower back pain - xrays, IBU and acetaminophen  not effective 3) HTN - BP well controlled 4) Follow up appt in 6 months   Problem List Items Addressed This Visit   None   No follow-ups on file.   Total time spent: 30 minutes  Neale Carpen, NP  02/14/2024   This document may have been prepared by [facility] Voice Recognition software and as such may include unintentional dictation errors.

## 2024-02-15 LAB — LIPID PANEL
Chol/HDL Ratio: 3.9 ratio (ref 0.0–5.0)
Cholesterol, Total: 132 mg/dL (ref 100–199)
HDL: 34 mg/dL — ABNORMAL LOW (ref >39)
LDL Chol Calc (NIH): 83 mg/dL (ref 0–99)
Triglycerides: 77 mg/dL (ref 0–149)
VLDL Cholesterol Cal: 15 mg/dL (ref 5–40)

## 2024-02-15 LAB — CMP14+EGFR
ALT: 53 IU/L — ABNORMAL HIGH (ref 0–44)
AST: 25 IU/L (ref 0–40)
Albumin: 4.7 g/dL (ref 3.8–4.9)
Alkaline Phosphatase: 108 IU/L (ref 44–121)
BUN/Creatinine Ratio: 17 (ref 9–20)
BUN: 28 mg/dL — ABNORMAL HIGH (ref 6–24)
Bilirubin Total: 1.4 mg/dL — ABNORMAL HIGH (ref 0.0–1.2)
CO2: 23 mmol/L (ref 20–29)
Calcium: 9.8 mg/dL (ref 8.7–10.2)
Chloride: 103 mmol/L (ref 96–106)
Creatinine, Ser: 1.65 mg/dL — ABNORMAL HIGH (ref 0.76–1.27)
Globulin, Total: 2.9 g/dL (ref 1.5–4.5)
Glucose: 90 mg/dL (ref 70–99)
Potassium: 4.3 mmol/L (ref 3.5–5.2)
Sodium: 140 mmol/L (ref 134–144)
Total Protein: 7.6 g/dL (ref 6.0–8.5)
eGFR: 48 mL/min/1.73 — ABNORMAL LOW (ref >59)

## 2024-02-15 LAB — TSH+FREE T4
Free T4: 1.04 ng/dL (ref 0.82–1.77)
TSH: 0.953 u[IU]/mL (ref 0.450–4.500)

## 2024-02-17 ENCOUNTER — Ambulatory Visit: Payer: Self-pay

## 2024-02-17 ENCOUNTER — Telehealth: Payer: Self-pay | Admitting: Family Medicine

## 2024-02-17 ENCOUNTER — Other Ambulatory Visit: Payer: Self-pay | Admitting: Nurse Practitioner

## 2024-02-17 DIAGNOSIS — I63312 Cerebral infarction due to thrombosis of left middle cerebral artery: Secondary | ICD-10-CM

## 2024-02-17 DIAGNOSIS — I10 Essential (primary) hypertension: Secondary | ICD-10-CM

## 2024-02-17 DIAGNOSIS — R3981 Functional urinary incontinence: Secondary | ICD-10-CM

## 2024-02-17 NOTE — Telephone Encounter (Signed)
 Fixed.

## 2024-02-17 NOTE — Telephone Encounter (Signed)
 [employer] forms faxed

## 2024-02-17 NOTE — Telephone Encounter (Signed)
 Patient advised on referral.

## 2024-02-17 NOTE — Telephone Encounter (Signed)
 FYI Only or Action Required?: Action required by provider: referral request. Pt is requesting nephrology referral. Pt has not heard anything about scheduling imaging.   Patient was last seen in primary care on 02/14/2024 by Glennon Sand, NP.  Called Nurse Triage reporting Advice Only.  Symptoms began unsure.  Interventions attempted: Nothing.  Symptoms are: stable.  Triage Disposition: Call PCP When Office is Open  Patient/caregiver understands and will follow disposition?: Yes          Copied from CRM [id number]. Topic: Clinical - Lab/Test Results >> Feb 17, 2024 10:03 AM Donee H wrote: Reason for CRM: Patient states would like to someone to explain lab results. Reason for Disposition  [1] Caller requesting NON-URGENT health information AND [2] PCP's office is the best resource  Answer Assessment - Initial Assessment Questions Shared provider's note: Please let pt know his bad or LDL cholesterol has improved, decreases risk of heart attack or stroke but his kidney function is lower than what it should be.  Does he drink adequate water each day for hydration?  He will need a nephrology referral because the kidneys cannot decline, please let me know when patient aware, and I will send the referral thanks    1. REASON FOR CALL: What is the main reason for your call? or How can I best help you?     Pt is calling for lab results  Protocols used: Information Only Call - No Triage-A-AH

## 2024-02-17 NOTE — Telephone Encounter (Signed)
 PCS Noted Copied Scanned Original in provider box Copy at front desk

## 2024-02-18 ENCOUNTER — Ambulatory Visit (HOSPITAL_COMMUNITY)
Admission: RE | Admit: 2024-02-18 | Discharge: 2024-02-18 | Disposition: A | Discharge status: Home / Self Care | Source: Ambulatory Visit | Attending: Nurse Practitioner | Admitting: Nurse Practitioner

## 2024-02-18 ENCOUNTER — Ambulatory Visit: Payer: Self-pay

## 2024-02-18 DIAGNOSIS — M25512 Pain in left shoulder: Secondary | ICD-10-CM | POA: Diagnosis present

## 2024-02-18 DIAGNOSIS — G8929 Other chronic pain: Secondary | ICD-10-CM | POA: Diagnosis present

## 2024-02-18 DIAGNOSIS — M545 Low back pain, unspecified: Secondary | ICD-10-CM | POA: Diagnosis present

## 2024-02-18 NOTE — Telephone Encounter (Signed)
 FYI Only or Action Required?: Action required by provider: patient requesting medication for symptoms.  Patient was last seen in primary care on 02/14/2024 by Glennon Sand, NP.  Called Nurse Triage reporting Shoulder Pain.  Symptoms began several years ago.  Interventions attempted: OTC medications: Advil and Tylenol .  Symptoms are: unchanged.  Triage Disposition: See PCP Within 2 Weeks  Patient/caregiver understands and will follow disposition?: No, wishes to speak with PCP       Copied from CRM #[id number]. Topic: Clinical - Red Word Triage >> Feb 18, 2024 11:46 AM Tiffany B wrote: Red Word that prompted transfer to Nurse Triage: Patient wanted to inform PCP he had x rays done today of the shoulder and would like PCP to prescribe a cream for his shoulder pain. Patient states he is in severe pain. Reason for Disposition  Shoulder pain is a chronic symptom (recurrent or ongoing AND present > 4 weeks)  Answer Assessment - Initial Assessment Questions L shoulder. Medication he has is not helping. Pt tried Advil and tyelnol for symptoms with no relief. Pt sates it goes from shoulder to L back to L leg. Denies rash, fever, weakness r neck pain. Patient requesting a massaging cream or cream in general for symptoms. Pt refused appointment in office for symptoms.    1. ONSET: When did the pain start?     3 years  2. LOCATION: Where is the pain located?     L shoulder  3. PAIN: How bad is the pain? (Scale 1-10; or mild, moderate, severe)     9/10 4. CAUSE: What do you think is causing the shoulder pain?     Unsure but patient has xray appointment today to get imaging done  5. OTHER SYMPTOMS: Do you have any other symptoms? (e.g., neck pain, swelling, rash, fever, numbness, weakness)     L arm is swelling  Protocols used: Shoulder Pain-A-AH

## 2024-02-19 NOTE — Telephone Encounter (Signed)
Xray results still pending

## 2024-02-20 ENCOUNTER — Telehealth: Payer: Self-pay

## 2024-02-20 NOTE — Telephone Encounter (Signed)
 Copied from CRM #[id number]. Topic: General - Other >> Feb 20, 2024 11:23 AM Treva T wrote: Reason for CRM: Received call from patient, checking status of forms that were dropped off at office to be completed.   Per chart note, PCS forms were faxed as of 02/17/24.  Patient would like to pick up forms, Can be reached at [phone]

## 2024-02-20 NOTE — Telephone Encounter (Signed)
 Copied from CRM [id number]. Topic: Clinical - Medication Question >> Feb 20, 2024 11:27 AM Treva T wrote: Reason for CRM: Patient is calling inquiring on medication for shoulder pain that was discussed previously.  Can be reached at [phone] to discuss further.

## 2024-02-20 NOTE — Telephone Encounter (Signed)
 Called patient forms are ready for pick up. ?

## 2024-02-20 NOTE — Telephone Encounter (Signed)
 Forms picked up

## 2024-02-26 ENCOUNTER — Telehealth: Payer: Self-pay

## 2024-02-26 NOTE — Telephone Encounter (Signed)
 I would wait since the results just came back yesterday. If [name] hasn't reviewed them by Friday, I can go ahead and review them

## 2024-02-26 NOTE — Telephone Encounter (Signed)
 Copied from CRM #[id number]. Topic: Clinical - Lab/Test Results >> Feb 26, 2024 10:15 AM Delon DASEN wrote: Reason for CRM: calling for imaging results

## 2024-02-27 ENCOUNTER — Other Ambulatory Visit: Payer: Self-pay | Admitting: Family Medicine

## 2024-02-27 DIAGNOSIS — M51362 Other intervertebral disc degeneration, lumbar region with discogenic back pain and lower extremity pain: Secondary | ICD-10-CM

## 2024-03-16 ENCOUNTER — Ambulatory Visit: Admitting: Internal Medicine

## 2024-03-20 NOTE — Telephone Encounter (Signed)
 Patient advised of results.

## 2024-04-08 NOTE — Therapy (Unsigned)
 OUTPATIENT PHYSICAL THERAPY THORACOLUMBAR EVALUATION   Patient Name: Darryl Sullivan MRN: [id number] DOB:[birth year], 58 y.o., male Today's Date: 04/08/2024  END OF SESSION:   Past Medical History:  Diagnosis Date   Hypertension    Stroke [facility])    No past surgical history on file. 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) 02/14/2024   IFG (impaired fasting glucose) 10/21/2023   Chronic back pain 07/30/2022   Impingement syndrome of left shoulder 10/10/2020   Stroke of right basal ganglia (HCC) 09/14/2020   Hyperlipidemia    Stage 3 chronic kidney disease (HCC)    Essential hypertension    Chronic left shoulder pain    Tobacco abuse    Hypertensive emergency    Cerebrovascular accident (CVA) (HCC) 09/05/2020    PCP: Terry Wilhelmena Lloyd Hilario, FNP  REFERRING PROVIDER: Terry Wilhelmena Lloyd Hilario, FNP  REFERRING DIAG:  [phone] (ICD-10-CM) - Degeneration of intervertebral disc of lumbar region with discogenic back pain and lower extremity pain    Rationale for Evaluation and Treatment: Rehabilitation  THERAPY DIAG:  No diagnosis found.  ONSET DATE: ***  SUBJECTIVE:                                                                                                                                                                                           SUBJECTIVE STATEMENT: ***  PERTINENT HISTORY:  Hx of CVA, L hemi  Chronic bilateral low back pain without sciatica Assessment & Plan: Bilateral straight and cross leg raise did not cause pain no discomfort. Prescriptions for back pain flexeril  5 mg as needed sent to pharmacy. Discussed medication desired effects, potential side effects. Non pharmacological interventions include rest, avoid twisting, straining lower back.  Demonstration of proper body mechanics. Alternate ice and heat. Recommend stretching back and legs. Follow up for worsening or persistent symptoms. Patient verbalizes understanding regarding plan of care and all questions answered.   PAIN:  Are you having pain? {OPRCPAIN:27236}  PRECAUTIONS: None  RED FLAGS: {PT Red Flags:29287}   WEIGHT BEARING RESTRICTIONS: No  FALLS:  Has patient fallen in last 6 months? {fallsyesno:27318}  LIVING ENVIRONMENT: Lives with: {OPRC lives with:25569::lives with their family} Lives in: {Lives in:25570} Stairs: {opstairs:27293} Has following equipment at home: {Assistive devices:23999}  OCCUPATION: ***  PLOF: {PLOF:24004}  PATIENT GOALS: ***  NEXT MD VISIT: ***  OBJECTIVE:  Note: Objective measures were completed at Evaluation unless otherwise noted.  DIAGNOSTIC FINDINGS:  ***  PATIENT SURVEYS:  Modified Oswestry:  MODIFIED OSWESTRY DISABILITY SCALE  Date: *** Score  Pain intensity {ODI 1:32962}  2. Personal care (washing, dressing, etc.) {ODI 2:32963}  3. Lifting {ODI 3:32964}  4. Walking {ODI 4:32965}  5. Sitting {ODI 5:32966}  6. Standing {ODI 6:32967}  7. Sleeping {ODI 7:32968}  8. Social Life {ODI 8:32969}  9. Traveling {ODI 9:32970}  10. Employment/ Homemaking {ODI 10:32971}  Total ***/50   Interpretation of scores: Score Category Description  0-20% Minimal Disability The patient can cope with most living activities. Usually no treatment is indicated apart from advice on lifting, sitting and exercise  21-40% Moderate Disability The patient experiences more pain and difficulty with sitting, lifting and standing. Travel and social life are more difficult and they may be disabled from work. Personal care, sexual activity and sleeping are not grossly affected, and the patient can usually be managed by conservative means  41-60% Severe Disability Pain remains the main problem in this group, but activities of daily living are  affected. These patients require a detailed investigation  61-80% Crippled Back pain impinges on all aspects of the patient's life. Positive intervention is required  81-100% Bed-bound These patients are either bed-bound or exaggerating their symptoms  Bluford FORBES Zoe DELENA Karon DELENA, et al. Surgery versus conservative management of stable thoracolumbar fracture: the PRESTO feasibility RCT. [place] ([place]): [employer]; 2021 Nov. [facility] Technology Assessment, No. 25.62.) Appendix 3, Oswestry Disability Index category descriptors. Available from: Findjewelers.cz  Minimally Clinically Important Difference (MCID) = 12.8%  COGNITION: Overall cognitive status: {cognition:24006}     SENSATION: {sensation:27233}  MUSCLE LENGTH: Hamstrings: Right *** deg; Left *** deg Debby test: Right *** deg; Left *** deg  POSTURE: {posture:25561}  PALPATION: ***  LUMBAR ROM:   AROM eval  Flexion   Extension   Right lateral flexion   Left lateral flexion   Right rotation   Left rotation    (Blank rows = not tested)  LOWER EXTREMITY ROM:     {AROM/PROM:27142}  Right eval Left eval  Hip flexion    Hip extension    Hip abduction    Hip adduction    Hip internal rotation    Hip external rotation    Knee flexion    Knee extension    Ankle dorsiflexion    Ankle plantarflexion    Ankle inversion    Ankle eversion     (Blank rows = not tested)  LOWER EXTREMITY MMT:    MMT Right eval Left eval  Hip flexion    Hip extension    Hip abduction    Hip adduction    Hip internal rotation    Hip external rotation    Knee flexion    Knee extension    Ankle dorsiflexion    Ankle plantarflexion    Ankle inversion    Ankle eversion     (Blank rows = not tested)  LUMBAR SPECIAL TESTS:  {lumbar special test:25242}  FUNCTIONAL TESTS:  {Functional tests:24029}  GAIT: Distance walked: *** Assistive device utilized: {Assistive  devices:23999} Level of assistance: {Levels of assistance:24026} Comments: ***  TREATMENT DATE:  04/09/24: PT Eval and HEP  PATIENT EDUCATION:  Education details: PT evaluation, objective findings, POC, Importance of HEP, Precautions, Clinic policies Person educated: Patient Education method: Explanation and Demonstration Education comprehension: verbalized understanding and returned demonstration  HOME EXERCISE PROGRAM: ***  ASSESSMENT:  CLINICAL IMPRESSION: Patient is a 58 y.o. male who was seen today for physical therapy evaluation and treatment for  M51.362 (ICD-10-CM) - Degeneration of intervertebral disc of lumbar region with discogenic back pain and lower extremity pain  .   OBJECTIVE IMPAIRMENTS: {opptimpairments:25111}.   ACTIVITY LIMITATIONS: {activitylimitations:27494}  PARTICIPATION LIMITATIONS: {participationrestrictions:25113}  PERSONAL FACTORS: {Personal factors:25162} are also affecting patient's functional outcome.   REHAB POTENTIAL: {rehabpotential:25112}  CLINICAL DECISION MAKING: {clinical decision making:25114}  EVALUATION COMPLEXITY: {Evaluation complexity:25115}   GOALS: Goals reviewed with patient? No  SHORT TERM GOALS: Target date: ***  *** Baseline: Goal status: INITIAL  2.  *** Baseline:  Goal status: INITIAL  3.  *** Baseline:  Goal status: INITIAL  4.  *** Baseline:  Goal status: INITIAL  5.  *** Baseline:  Goal status: INITIAL  6.  *** Baseline:  Goal status: INITIAL  LONG TERM GOALS: Target date: ***  *** Baseline:  Goal status: INITIAL  2.  *** Baseline:  Goal status: INITIAL  3.  *** Baseline:  Goal status: INITIAL  4.  *** Baseline:  Goal status: INITIAL  5.  *** Baseline:  Goal status: INITIAL  6.  *** Baseline:  Goal status: INITIAL  PLAN:  PT FREQUENCY: {rehab  frequency:25116}  PT DURATION: {rehab duration:25117}  PLANNED INTERVENTIONS: 97164- PT Re-evaluation, 97110-Therapeutic exercises, 97530- Therapeutic activity, 97112- Neuromuscular re-education, 97535- Self Care, 02859- Manual therapy, [id number]- Gait training, [id number]- Electrical stimulation (manual), [id number]- Ultrasound, 02987- Traction (mechanical), 20560 (1-2 muscles), 20561 (3+ muscles)- Dry Needling, Patient/Family education, Balance training, Stair training, Taping, Joint mobilization, Spinal mobilization, Cryotherapy, and Moist heat.  PLAN FOR NEXT SESSION: Review HEP and goals,    4:23 PM, 04/08/24 Rosaria Settler, PT, DPT [facility] [place]

## 2024-04-09 ENCOUNTER — Telehealth (HOSPITAL_COMMUNITY): Payer: Self-pay

## 2024-04-09 ENCOUNTER — Ambulatory Visit (HOSPITAL_COMMUNITY)

## 2024-04-09 NOTE — Telephone Encounter (Signed)
 Called patient concerning missed evaluation this date. Patient reports he has a cold but couldn't find the number to call and cancel. Patient transferred to front desk staff to reschedule evaluation.   4:51 PM, 04/09/24 Rosaria Settler, PT, DPT [facility] [place]

## 2024-05-12 NOTE — Therapy (Incomplete)
 OUTPATIENT PHYSICAL THERAPY THORACOLUMBAR EVALUATION   Patient Name: Darryl Sullivan MRN: [id number] DOB:[birth year], 58 y.o., male Today's Date: 05/12/2024  END OF SESSION:   Past Medical History:  Diagnosis Date   Hypertension    Stroke [facility])    No past surgical history on file. 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  ONSET DATE: ***  SUBJECTIVE:                                                                                                                                                                                           SUBJECTIVE STATEMENT: ***  PERTINENT HISTORY:  ***  PAIN:  Are you having pain? {OPRCPAIN:27236}  PRECAUTIONS: {Therapy precautions:24002}  RED FLAGS: {PT Red Flags:29287}   WEIGHT BEARING RESTRICTIONS: {Yes ***/No:24003}  FALLS:  Has patient fallen in last 6 months? {fallsyesno:27318}  LIVING ENVIRONMENT: Lives with: {OPRC lives with:25569::lives with their family} Lives in: {Lives in:25570} Stairs: {opstairs:27293} Has following equipment at home: {Assistive devices:23999}  OCCUPATION: ***  PLOF: {PLOF:24004}  PATIENT GOALS: ***  NEXT MD VISIT: ***  OBJECTIVE:  Note:  Objective measures were completed at Evaluation unless otherwise noted.  DIAGNOSTIC FINDINGS:  ***  PATIENT SURVEYS:  {rehab surveys:24030}  COGNITION: Overall cognitive status: {cognition:24006}     SENSATION: {sensation:27233}  MUSCLE LENGTH: Hamstrings: Right *** deg; Left *** deg Debby test: Right *** deg; Left *** deg  POSTURE: {posture:25561}  PALPATION: ***  LUMBAR ROM:   AROM eval  Flexion   Extension   Right lateral flexion   Left lateral flexion   Right rotation   Left rotation    (Blank rows = not tested)  LOWER EXTREMITY ROM:     {AROM/PROM:27142}  Right eval Left eval  Hip flexion    Hip extension    Hip abduction    Hip adduction    Hip internal rotation  Hip external rotation    Knee flexion    Knee extension    Ankle dorsiflexion    Ankle plantarflexion    Ankle inversion    Ankle eversion     (Blank rows = not tested)  LOWER EXTREMITY MMT:    MMT Right eval Left eval  Hip flexion    Hip extension    Hip abduction    Hip adduction    Hip internal rotation    Hip external rotation    Knee flexion    Knee extension    Ankle dorsiflexion    Ankle plantarflexion    Ankle inversion    Ankle eversion     (Blank rows = not tested)  LUMBAR SPECIAL TESTS:  {lumbar special test:25242}  FUNCTIONAL TESTS:  {Functional tests:24029}  GAIT: Distance walked: *** Assistive device utilized: {Assistive devices:23999} Level of assistance: {Levels of assistance:24026} Comments: ***  TREATMENT DATE: ***                                                                                                                                 PATIENT EDUCATION:  Education details: *** Person educated: {Person educated:25204} Education method: {Education Method:25205} Education comprehension: {Education Comprehension:25206}  HOME EXERCISE PROGRAM: ***  ASSESSMENT:  CLINICAL IMPRESSION: Patient is a *** y.o. *** who was seen today for  physical therapy evaluation and treatment for ***.   OBJECTIVE IMPAIRMENTS: {opptimpairments:25111}.   ACTIVITY LIMITATIONS: {activitylimitations:27494}  PARTICIPATION LIMITATIONS: {participationrestrictions:25113}  PERSONAL FACTORS: {Personal factors:25162} are also affecting patient's functional outcome.   REHAB POTENTIAL: {rehabpotential:25112}  CLINICAL DECISION MAKING: {clinical decision making:25114}  EVALUATION COMPLEXITY: {Evaluation complexity:25115}   GOALS: Goals reviewed with patient? {yes/no:20286}  SHORT TERM GOALS: Target date: ***  *** Baseline: Goal status: INITIAL  2.  *** Baseline:  Goal status: INITIAL  3.  *** Baseline:  Goal status: INITIAL  4.  *** Baseline:  Goal status: INITIAL  5.  *** Baseline:  Goal status: INITIAL  6.  *** Baseline:  Goal status: INITIAL  LONG TERM GOALS: Target date: ***  *** Baseline:  Goal status: INITIAL  2.  *** Baseline:  Goal status: INITIAL  3.  *** Baseline:  Goal status: INITIAL  4.  *** Baseline:  Goal status: INITIAL  5.  *** Baseline:  Goal status: INITIAL  6.  *** Baseline:  Goal status: INITIAL  PLAN:  PT FREQUENCY: {rehab frequency:25116}  PT DURATION: {rehab duration:25117}  PLANNED INTERVENTIONS: {rehab planned interventions:25118::97110-Therapeutic exercises,97530- Therapeutic [phone]- Neuromuscular re-education,97535- Self Rjmz,02859- Manual therapy,Patient/Family education}.  PLAN FOR NEXT SESSION: ***   Greig GORMAN Quivers, PT 05/12/2024, 8:21 AM

## 2024-05-13 ENCOUNTER — Ambulatory Visit (HOSPITAL_COMMUNITY)

## 2024-06-22 NOTE — Therapy (Incomplete)
 " OUTPATIENT PHYSICAL THERAPY THORACOLUMBAR EVALUATION   Patient Name: Darryl Sullivan MRN: [id number] DOB:[birth year], 59 y.o., male Today's Date: 06/22/2024  END OF SESSION:   Past Medical History:  Diagnosis Date   Hypertension    Stroke [facility])    No past surgical history on file. 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) 02/14/2024   IFG (impaired fasting glucose) 10/21/2023   Chronic back pain 07/30/2022   Impingement syndrome of left shoulder 10/10/2020   Stroke of right basal ganglia (HCC) 09/14/2020   Hyperlipidemia    Stage 3 chronic kidney disease (HCC)    Essential hypertension    Chronic left shoulder pain    Tobacco abuse    Hypertensive emergency    Cerebrovascular accident (CVA) (HCC) 09/05/2020    PCP: Terry Wilhelmena Lloyd Hilario, FNP  REFERRING PROVIDER: Terry Wilhelmena Lloyd Hilario, FNP  REFERRING DIAG: [phone] (ICD-10-CM) - Degeneration of intervertebral disc of lumbar region with discogenic back pain and lower extremity pain  Rationale for Evaluation and Treatment: Rehabilitation  THERAPY DIAG:  No diagnosis found.  ONSET DATE: ***  SUBJECTIVE:                                                                                                                                                                                           SUBJECTIVE STATEMENT: ***  PERTINENT HISTORY:  ***  PAIN:  Are you having pain? {OPRCPAIN:27236}  PRECAUTIONS: {Therapy precautions:24002}  RED FLAGS: {PT Red Flags:29287}   WEIGHT BEARING RESTRICTIONS: {Yes ***/No:24003}  FALLS:  Has patient fallen in last 6 months? {fallsyesno:27318}  LIVING ENVIRONMENT: Lives with: {OPRC lives with:25569::lives with their family} Lives in: {Lives in:25570} Stairs:  {opstairs:27293} Has following equipment at home: {Assistive devices:23999}  OCCUPATION: ***  PLOF: {PLOF:24004}  PATIENT GOALS: ***  NEXT MD VISIT: ***  OBJECTIVE:  Note: Objective measures were completed at Evaluation unless otherwise noted.  DIAGNOSTIC FINDINGS:  CLINICAL DATA:  Chronic lower back pain without reported injury.   EXAM: LUMBAR SPINE - 2-3 VIEW   COMPARISON:  None Available.   FINDINGS: There is no evidence of lumbar spine fracture. Alignment is normal. Intervertebral disc spaces are maintained. Minimal anterior osteophyte formation is noted at L3-4.   IMPRESSION: Minimal degenerative changes as described above. No acute abnormality seen.  PATIENT SURVEYS:  Modified Oswestry: {:PHR,OPRCODI}  COGNITION: Overall cognitive status: {cognition:24006}     SENSATION: {sensation:27233}  MUSCLE LENGTH: Hamstrings: Right *** deg; Left *** deg  Thomas test: Right *** deg; Left *** deg  POSTURE: {posture:25561}  PALPATION: ***  LUMBAR ROM:   AROM eval  Flexion   Extension   Right lateral flexion   Left lateral flexion   Right rotation   Left rotation    (Blank rows = not tested)  LOWER EXTREMITY ROM:     Active  Right eval Left eval  Hip flexion    Hip extension    Hip abduction    Hip adduction    Hip internal rotation    Hip external rotation    Knee flexion    Knee extension    Ankle dorsiflexion    Ankle plantarflexion    Ankle inversion    Ankle eversion     (Blank rows = not tested)  LOWER EXTREMITY MMT:    MMT Right eval Left eval  Hip flexion    Hip extension    Hip abduction    Hip adduction    Hip internal rotation    Hip external rotation    Knee flexion    Knee extension    Ankle dorsiflexion    Ankle plantarflexion    Ankle inversion    Ankle eversion     (Blank rows = not tested)  LUMBAR SPECIAL TESTS:  {lumbar special test:25242}  FUNCTIONAL TESTS:  5 times sit to stand: *** 2 minute walk test:  ***  GAIT: Distance walked: *** Assistive device utilized: {Assistive devices:23999} Level of assistance: {Levels of assistance:24026} Comments: ***  TREATMENT DATE:  06/22/2024   Evaluation: -ROM measured, Strength assessed, HEP prescribed, pt educated on prognosis, findings, and importance of HEP compliance if given.                                                                                                                                      PATIENT EDUCATION:  Education details: *** Person educated: {Person educated:25204} Education method: {Education Method:25205} Education comprehension: {Education Comprehension:25206}  HOME EXERCISE PROGRAM: ***  ASSESSMENT:  CLINICAL IMPRESSION: Patient is a 59 y.o. male who was seen today for physical therapy evaluation and treatment for M51.362 (ICD-10-CM) - Degeneration of intervertebral disc of lumbar region with discogenic back pain and lower extremity pain.   Patient demonstrates decreased LE strength, abnormal pain rating, and impaired balance. Patient also demonstrates difficulty with ambulation during today's session with decreased stride length and velocity noted. Patient also demonstrates ***. Patient requires ***. Patient would benefit from skilled physical therapy for increased endurance with ambulation, increased LE strength, and balance for improved gait quality, return to higher level of function with ADLs, and progress towards therapy goals.   OBJECTIVE IMPAIRMENTS: {opptimpairments:25111}.   ACTIVITY LIMITATIONS: {activitylimitations:27494}  PARTICIPATION LIMITATIONS: {participationrestrictions:25113}  PERSONAL FACTORS: {Personal factors:25162} are also affecting patient's functional outcome.   REHAB POTENTIAL: {rehabpotential:25112}  CLINICAL DECISION MAKING: {clinical decision making:25114}  EVALUATION COMPLEXITY: {Evaluation complexity:25115}   GOALS: Goals reviewed with patient?  {  yes/no:20286}  SHORT TERM GOALS: Target date: ***  Pt will be independent with HEP in order to demonstrate participation in Physical Therapy POC.  Baseline: Goal status: {GOALSTATUS:25110}  2.  Pt will report ***/10 pain with mobility in order to demonstrate improved pain with ADLs.  Baseline:  Goal status: {GOALSTATUS:25110}  LONG TERM GOALS: Target date: ***  Pt will improve *** by *** in order to demonstrate improved functional strength to return to desired activities.  Baseline: see objective.  Goal status: {GOALSTATUS:25110}  2.  Pt will improve 2 MWT by *** in order to demonstrate improved functional ambulatory capacity in community setting.  Baseline: see objective.  Goal status: {GOALSTATUS:25110}  3.  Pt will improve Modified Oswestry score by *** in order to demonstrate improved pain with functional goals and outcomes. Baseline: see objective.  Goal status: {GOALSTATUS:25110}  4.  Pt will report ***/10 pain with mobility in order to demonstrate reduced pain with ADLs lasting greater than 30 minutes.  Baseline: see objective.  Goal status: {GOALSTATUS:25110}   PLAN:  PT FREQUENCY: {rehab frequency:25116}  PT DURATION: {rehab duration:25117}  PLANNED INTERVENTIONS: {rehab planned interventions:25118::97110-Therapeutic exercises,97530- Therapeutic [id number]- Neuromuscular re-education,97535- Self Rjmz,02859- Manual therapy,Patient/Family education}.  PLAN FOR NEXT SESSION: ***   Lang Ada, PT, DPT [facility] Office: [phone] 9:25 AM, 06/22/2024   "

## 2024-06-23 ENCOUNTER — Ambulatory Visit (HOSPITAL_COMMUNITY): Payer: Self-pay

## 2024-07-01 ENCOUNTER — Ambulatory Visit: Admitting: Internal Medicine

## 2024-07-10 ENCOUNTER — Ambulatory Visit: Payer: Self-pay | Admitting: Internal Medicine

## 2024-07-13 ENCOUNTER — Ambulatory Visit (HOSPITAL_COMMUNITY): Payer: Self-pay

## 2024-08-03 ENCOUNTER — Ambulatory Visit (HOSPITAL_COMMUNITY)

## 2024-09-21 ENCOUNTER — Ambulatory Visit: Admitting: Internal Medicine
